# Patient Record
Sex: Male | Born: 1980
Health system: Southern US, Community
[De-identification: ages and names within clinical notes are randomized; demographics above are authoritative.]

## PROBLEM LIST (undated history)

## (undated) DIAGNOSIS — I10 Essential (primary) hypertension: Secondary | ICD-10-CM

## (undated) DIAGNOSIS — M549 Dorsalgia, unspecified: Secondary | ICD-10-CM

## (undated) DIAGNOSIS — G4733 Obstructive sleep apnea (adult) (pediatric): Secondary | ICD-10-CM

## (undated) DIAGNOSIS — F319 Bipolar disorder, unspecified: Secondary | ICD-10-CM

## (undated) HISTORY — DX: Bipolar disorder, unspecified: F31.9

## (undated) HISTORY — DX: Obstructive sleep apnea (adult) (pediatric): G47.33

## (undated) HISTORY — DX: Essential (primary) hypertension: I10

## (undated) HISTORY — PX: TONSILLECTOMY: SUR1361

## (undated) HISTORY — DX: Dorsalgia, unspecified: M54.9

## (undated) HISTORY — PX: RHINOPLASTY: SUR1284

---
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
## 2001-05-04 ENCOUNTER — Emergency Department (HOSPITAL_COMMUNITY): Admission: EM | Admit: 2001-05-04 | Discharge: 2001-05-04 | Payer: Self-pay | Admitting: Emergency Medicine

## 2003-08-24 ENCOUNTER — Ambulatory Visit (HOSPITAL_COMMUNITY): Admission: RE | Admit: 2003-08-24 | Discharge: 2003-08-24 | Payer: Self-pay | Admitting: Internal Medicine

## 2003-08-24 ENCOUNTER — Encounter: Payer: Self-pay | Admitting: Internal Medicine

## 2004-01-20 ENCOUNTER — Encounter (HOSPITAL_COMMUNITY): Admission: RE | Admit: 2004-01-20 | Discharge: 2004-02-19 | Payer: Self-pay | Admitting: Internal Medicine

## 2004-02-09 ENCOUNTER — Ambulatory Visit (HOSPITAL_COMMUNITY): Admission: RE | Admit: 2004-02-09 | Discharge: 2004-02-09 | Payer: Self-pay | Admitting: Internal Medicine

## 2004-11-10 ENCOUNTER — Emergency Department (HOSPITAL_COMMUNITY): Admission: EM | Admit: 2004-11-10 | Discharge: 2004-11-10 | Payer: Self-pay | Admitting: Emergency Medicine

## 2005-11-25 ENCOUNTER — Emergency Department (HOSPITAL_COMMUNITY): Admission: EM | Admit: 2005-11-25 | Discharge: 2005-11-25 | Payer: Self-pay | Admitting: Emergency Medicine

## 2005-11-26 ENCOUNTER — Inpatient Hospital Stay (HOSPITAL_COMMUNITY): Admission: EM | Admit: 2005-11-26 | Discharge: 2005-11-30 | Payer: Self-pay | Admitting: Psychiatry

## 2005-11-27 ENCOUNTER — Ambulatory Visit: Payer: Self-pay | Admitting: Psychiatry

## 2008-06-14 ENCOUNTER — Ambulatory Visit: Payer: Self-pay | Admitting: *Deleted

## 2008-06-14 ENCOUNTER — Emergency Department (HOSPITAL_COMMUNITY): Admission: EM | Admit: 2008-06-14 | Discharge: 2008-06-14 | Payer: Self-pay | Admitting: Emergency Medicine

## 2008-06-15 ENCOUNTER — Inpatient Hospital Stay (HOSPITAL_COMMUNITY): Admission: AD | Admit: 2008-06-15 | Discharge: 2008-06-17 | Payer: Self-pay | Admitting: *Deleted

## 2008-06-23 ENCOUNTER — Ambulatory Visit (HOSPITAL_COMMUNITY): Payer: Self-pay | Admitting: Psychiatry

## 2008-07-01 ENCOUNTER — Ambulatory Visit (HOSPITAL_COMMUNITY): Payer: Self-pay | Admitting: Psychiatry

## 2008-07-07 ENCOUNTER — Ambulatory Visit (HOSPITAL_COMMUNITY): Payer: Self-pay | Admitting: Psychiatry

## 2008-07-15 ENCOUNTER — Ambulatory Visit (HOSPITAL_COMMUNITY): Payer: Self-pay | Admitting: Psychiatry

## 2008-07-20 ENCOUNTER — Ambulatory Visit (HOSPITAL_COMMUNITY): Admission: RE | Admit: 2008-07-20 | Discharge: 2008-07-20 | Payer: Self-pay | Admitting: Neurology

## 2008-08-09 ENCOUNTER — Ambulatory Visit (HOSPITAL_COMMUNITY): Payer: Self-pay | Admitting: Psychiatry

## 2008-10-06 ENCOUNTER — Ambulatory Visit (HOSPITAL_COMMUNITY): Payer: Self-pay | Admitting: Psychiatry

## 2008-11-03 ENCOUNTER — Ambulatory Visit (HOSPITAL_COMMUNITY): Payer: Self-pay | Admitting: Psychiatry

## 2008-11-17 ENCOUNTER — Ambulatory Visit (HOSPITAL_COMMUNITY): Payer: Self-pay | Admitting: Psychiatry

## 2009-01-24 ENCOUNTER — Ambulatory Visit (HOSPITAL_COMMUNITY): Payer: Self-pay | Admitting: Psychiatry

## 2009-03-23 ENCOUNTER — Ambulatory Visit (HOSPITAL_COMMUNITY): Payer: Self-pay | Admitting: Psychiatry

## 2009-06-13 ENCOUNTER — Ambulatory Visit (HOSPITAL_COMMUNITY): Payer: Self-pay | Admitting: Psychiatry

## 2009-09-05 ENCOUNTER — Ambulatory Visit (HOSPITAL_COMMUNITY): Payer: Self-pay | Admitting: Psychiatry

## 2009-10-05 ENCOUNTER — Ambulatory Visit (HOSPITAL_COMMUNITY): Payer: Self-pay | Admitting: Psychiatry

## 2009-12-05 ENCOUNTER — Ambulatory Visit (HOSPITAL_COMMUNITY): Payer: Self-pay | Admitting: Psychiatry

## 2010-03-06 ENCOUNTER — Ambulatory Visit (HOSPITAL_COMMUNITY): Payer: Self-pay | Admitting: Psychiatry

## 2010-06-05 ENCOUNTER — Ambulatory Visit (HOSPITAL_COMMUNITY): Payer: Self-pay | Admitting: Psychiatry

## 2010-09-04 ENCOUNTER — Ambulatory Visit (HOSPITAL_COMMUNITY): Payer: Self-pay | Admitting: Psychiatry

## 2010-11-06 ENCOUNTER — Ambulatory Visit (HOSPITAL_COMMUNITY): Payer: Self-pay | Admitting: Psychiatry

## 2011-01-03 ENCOUNTER — Ambulatory Visit (HOSPITAL_COMMUNITY)
Admission: RE | Admit: 2011-01-03 | Discharge: 2011-01-03 | Payer: Self-pay | Source: Home / Self Care | Attending: Psychiatry | Admitting: Psychiatry

## 2011-03-05 ENCOUNTER — Encounter (INDEPENDENT_AMBULATORY_CARE_PROVIDER_SITE_OTHER): Payer: MEDICARE | Admitting: Psychiatry

## 2011-03-05 DIAGNOSIS — F3189 Other bipolar disorder: Secondary | ICD-10-CM

## 2011-04-04 ENCOUNTER — Encounter (INDEPENDENT_AMBULATORY_CARE_PROVIDER_SITE_OTHER): Payer: MEDICARE | Admitting: Psychiatry

## 2011-04-04 DIAGNOSIS — F3189 Other bipolar disorder: Secondary | ICD-10-CM

## 2011-04-30 ENCOUNTER — Encounter (HOSPITAL_COMMUNITY): Payer: MEDICARE | Admitting: Psychiatry
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
## 2011-06-04 ENCOUNTER — Encounter (INDEPENDENT_AMBULATORY_CARE_PROVIDER_SITE_OTHER): Payer: Medicare Other | Admitting: Psychiatry

## 2011-06-04 DIAGNOSIS — F3189 Other bipolar disorder: Secondary | ICD-10-CM

## 2011-07-02 ENCOUNTER — Encounter (INDEPENDENT_AMBULATORY_CARE_PROVIDER_SITE_OTHER): Payer: Medicare Other | Admitting: Psychiatry

## 2011-07-02 DIAGNOSIS — F3189 Other bipolar disorder: Secondary | ICD-10-CM

## 2011-08-01 ENCOUNTER — Encounter (INDEPENDENT_AMBULATORY_CARE_PROVIDER_SITE_OTHER): Payer: Medicare Other | Admitting: Psychiatry

## 2011-08-01 DIAGNOSIS — F3189 Other bipolar disorder: Secondary | ICD-10-CM

## 2011-09-12 LAB — URINALYSIS, ROUTINE W REFLEX MICROSCOPIC
Bilirubin Urine: NEGATIVE
Glucose, UA: NEGATIVE
Hgb urine dipstick: NEGATIVE
Protein, ur: NEGATIVE
Specific Gravity, Urine: 1.015

## 2011-09-12 LAB — DIFFERENTIAL
Basophils Absolute: 0.2 — ABNORMAL HIGH
Basophils Relative: 1
Monocytes Absolute: 0.6
Neutro Abs: 6
Neutrophils Relative %: 57

## 2011-09-12 LAB — RAPID URINE DRUG SCREEN, HOSP PERFORMED
Amphetamines: NOT DETECTED
Barbiturates: NOT DETECTED
Benzodiazepines: POSITIVE — AB
Opiates: POSITIVE — AB
Tetrahydrocannabinol: NOT DETECTED

## 2011-09-12 LAB — BASIC METABOLIC PANEL
CO2: 25
Calcium: 9.3
Creatinine, Ser: 0.82
Glucose, Bld: 119 — ABNORMAL HIGH

## 2011-09-12 LAB — VALPROIC ACID LEVEL: Valproic Acid Lvl: 60.5

## 2011-09-12 LAB — CBC
MCHC: 34.8
Platelets: 277
RDW: 12.9

## 2011-09-26 ENCOUNTER — Encounter (INDEPENDENT_AMBULATORY_CARE_PROVIDER_SITE_OTHER): Payer: Medicare Other | Admitting: Psychiatry

## 2011-09-26 DIAGNOSIS — F3189 Other bipolar disorder: Secondary | ICD-10-CM

## 2011-10-15 ENCOUNTER — Encounter (INDEPENDENT_AMBULATORY_CARE_PROVIDER_SITE_OTHER): Payer: Medicare Other | Admitting: Psychiatry

## 2011-10-15 DIAGNOSIS — F3189 Other bipolar disorder: Secondary | ICD-10-CM

## 2011-10-21 NOTE — Progress Notes (Signed)
This encounter was created in error - please disregard.

## 2011-10-24 ENCOUNTER — Other Ambulatory Visit (HOSPITAL_COMMUNITY): Payer: Self-pay | Admitting: Psychiatry

## 2011-10-25 LAB — LITHIUM LEVEL: Lithium Lvl: 0.97 mEq/L (ref 0.80–1.40)

## 2011-11-05 ENCOUNTER — Ambulatory Visit (INDEPENDENT_AMBULATORY_CARE_PROVIDER_SITE_OTHER): Payer: Medicare Other | Admitting: Psychiatry

## 2011-11-05 ENCOUNTER — Encounter (HOSPITAL_COMMUNITY): Payer: Medicare Other | Admitting: Psychiatry

## 2011-11-05 ENCOUNTER — Encounter (HOSPITAL_COMMUNITY): Payer: Self-pay | Admitting: Psychiatry

## 2011-11-05 VITALS — Wt 276.0 lb

## 2011-11-05 DIAGNOSIS — F319 Bipolar disorder, unspecified: Secondary | ICD-10-CM

## 2011-11-05 MED ORDER — BUPROPION HCL ER (XL) 300 MG PO TB24: 300.0000 mg | ORAL_TABLET | Freq: Every day | ORAL | Status: DC

## 2011-11-05 MED ORDER — LITHIUM CARBONATE 300 MG PO TABS: ORAL_TABLET | ORAL | Status: DC

## 2011-11-05 NOTE — Progress Notes (Signed)
Patient came for his appointment he is now taking lithium 600 mg in the morning and 900 mg at bedtime. Reported he has seen a big improvement in his behavior anger and sleep. He told his wife also noticed improvement in his illness. He has noticed much calmer and less agitated and handling his family issues better. His lithium level is 0.97 and he reported no side effects of medication. He is still does not want and counseling but feels his current medication working good. He has lost almost 40 pound since he stopped from Seroquel and Depakote. He appears pleasant cooperative and engageable. He denies any anger or irritable note. He is looking forward to have Thanksgiving dinner with her family. He'll still take Percocet and morphine sulfate for his back pain. He was recommended to have a back surgery however he was told that he need to reduce his weight and quit smoking and he is working on it.  Mental status emanation Patient is calm cooperative pleasant and maintained good eye contact. His speech is soft clear and coherent. His thought process is also logical and linear. He denies any active or passive suicidal thinking or homicidal thinking. There are no psychotic symptoms present. There no shakes or tremors present. His attention and concentration is improved. He is alert and oriented x3. His insight judgment and impulse control is better.  Assessment Bipolar disorder, polysubstance dependence in partial remission  Plan I will continue his current dose of lithium and Wellbutrin. I have explained the risk and benefits of medication. He will continue to take his pain medication for his back pain. He has not quit smoking but like to stop in the or future so that he can have a back surgery. I recommended to call us if he has any question or concern otherwise I will see him again in 2 months.

## 2011-11-06 ENCOUNTER — Other Ambulatory Visit (HOSPITAL_COMMUNITY): Payer: Self-pay | Admitting: Psychiatry

## 2011-11-06 MED ORDER — LITHIUM CARBONATE ER 300 MG PO TBCR: EXTENDED_RELEASE_TABLET | ORAL | Status: DC

## 2011-11-30 ENCOUNTER — Other Ambulatory Visit (HOSPITAL_COMMUNITY): Payer: Self-pay

## 2012-01-02 ENCOUNTER — Encounter (HOSPITAL_COMMUNITY): Payer: Self-pay | Admitting: Psychiatry

## 2012-01-02 ENCOUNTER — Ambulatory Visit (HOSPITAL_COMMUNITY): Payer: Medicare Other | Admitting: Psychiatry

## 2012-01-02 ENCOUNTER — Ambulatory Visit (INDEPENDENT_AMBULATORY_CARE_PROVIDER_SITE_OTHER): Payer: Medicare Other | Admitting: Psychiatry

## 2012-01-02 VITALS — Wt 251.0 lb

## 2012-01-02 DIAGNOSIS — F319 Bipolar disorder, unspecified: Secondary | ICD-10-CM

## 2012-01-02 NOTE — Progress Notes (Signed)
Patient came for his appointment with his wife. Patient admitted that recently she's been more irritable agitated frustrated and having angry episodes. Wife also endorse that he's been very angry punching the wall cursing and yelling and sometime resistant to take medication. Patient admitted he has stopped taking lithium for piled due to the concern that it was causing sexual side effects however when he realizes that he is still have sexual side effects he started again his lithium. Patient reported that he continues to have paranoid thinking and has been very isolated secluded and at times withdrawn. He does not want to be bothered by anyone. Though he denies any active or passive suicidal thoughts but admitted extreme mood lability and agitation. I have reviewed his past psychiatric history. He has been taken Seroquel Risperdal and Depakote in the past but he stopped due to the the sexual side effects of weight gain. Patient does not want to go back on these medication however willing to try a different medication. He is happy as he loss 20 pounds in past 3 months however he also admitted that he is in a crash diet. He is not using any drugs or alcohol at this time.  [facility] with suicidal plan to shoot himself.  Alcohol and substance use history Patient has history of using crack and cocaine in the past.   Medical history  Chronic back pain obesity hypertension  Mental status examination Patient is mildly obese male who is casually dressed and fairly groomed. He maintained poor eye contact. Somebody is guarded. His speech is slow but coherent. He at times appeared frustrated and irritable. He denies any active or passive suicidal thoughts or homicidal thoughts. He endorse paranoid thinking but there were no delusion obsession present. He has some mild shakes. He's alert and oriented x3. His insight  judgment and impulse control is okay  Assessment Axis I Bipolar disorder with psychotic features,           polysubstance dependence in partial remission Axis II deferred Axis III see medical history Axis IV moderate  Plan At this time I see patient has been decompensating slowly. I talked to the patient and his wife lamp about his sign symptoms of aggression in need of medical adjustment. Patient is very reluctant to go back on Depakote Seroquel Risperdal or Zyprexa due to weight gain however he is willing to try Abilify. I will try Abilify 5 mg a day and gradually increase to 10 mg in one week. I have explained risks and benefits of medication in length especially possible sexual side effects and weight gain however we will closely monitor the medication response. We also talked about safety plan that in case if he feels worsening of her symptoms or any time having suicidal thoughts or homicidal thoughts and he need to call 911 or go to local ER. For now he will continue lithium and Wellbutrin. Once again I have offered him sleep however he refused. I will see him again in 3 weeks. I have given samples of Abilify. Time spent 30 minutes

## 2012-01-15 ENCOUNTER — Other Ambulatory Visit (HOSPITAL_COMMUNITY): Payer: Self-pay | Admitting: *Deleted

## 2012-01-15 ENCOUNTER — Other Ambulatory Visit (HOSPITAL_COMMUNITY): Payer: Self-pay | Admitting: Psychiatry

## 2012-01-15 MED ORDER — BUPROPION HCL ER (XL) 300 MG PO TB24: 300.0000 mg | ORAL_TABLET | Freq: Every day | ORAL | Status: DC

## 2012-01-21 ENCOUNTER — Ambulatory Visit (INDEPENDENT_AMBULATORY_CARE_PROVIDER_SITE_OTHER): Payer: Medicare Other | Admitting: Psychiatry

## 2012-01-21 ENCOUNTER — Encounter (HOSPITAL_COMMUNITY): Payer: Self-pay | Admitting: Psychiatry

## 2012-01-21 VITALS — BP 138/65 | HR 74 | Wt 243.0 lb

## 2012-01-21 DIAGNOSIS — Z79899 Other long term (current) drug therapy: Secondary | ICD-10-CM

## 2012-01-21 DIAGNOSIS — F319 Bipolar disorder, unspecified: Secondary | ICD-10-CM

## 2012-01-21 LAB — COMPREHENSIVE METABOLIC PANEL
ALT: 30 U/L (ref 0–53)
AST: 16 U/L (ref 0–37)
Calcium: 10.6 mg/dL — ABNORMAL HIGH (ref 8.4–10.5)
Chloride: 103 mEq/L (ref 96–112)
Creat: 0.93 mg/dL (ref 0.50–1.35)
Total Bilirubin: 0.5 mg/dL (ref 0.3–1.2)

## 2012-01-21 LAB — CBC WITH DIFFERENTIAL/PLATELET
Basophils Absolute: 0 10*3/uL (ref 0.0–0.1)
Eosinophils Relative: 2 % (ref 0–5)
Lymphocytes Relative: 32 % (ref 12–46)
MCV: 94.9 fL (ref 78.0–100.0)
Platelets: 373 10*3/uL (ref 150–400)
RDW: 12.8 % (ref 11.5–15.5)
WBC: 10.2 10*3/uL (ref 4.0–10.5)

## 2012-01-21 MED ORDER — BUPROPION HCL ER (XL) 300 MG PO TB24: 300.0000 mg | ORAL_TABLET | Freq: Every day | ORAL | Status: DC

## 2012-01-21 MED ORDER — LITHIUM CARBONATE ER 300 MG PO TBCR: EXTENDED_RELEASE_TABLET | ORAL | Status: DC

## 2012-01-21 MED ORDER — ARIPIPRAZOLE 10 MG PO TABS: 10.0000 mg | ORAL_TABLET | Freq: Every day | ORAL | Status: DC

## 2012-01-21 NOTE — Progress Notes (Signed)
Chief complaint I'm doing much better with Abilify  History of present illness Patient came for his appointment with his wife. Patient admitted that Abilify helping his mood anger and agitation. He and his wife noticed less irritable and much calmer. He denies any recent episode of punching the wall or cursing or yelling. He likes current Abilify. He reported no side effects of medication. He is taking 10 mg Abilify. He denies any sexual side effects her weight gain. Actually he has lost weight from the last visit. He is wondering if Abilify can be further increased. He is not using drugs or alcohol at this time.   [facility]using [facility] with suicidal plan to shoot himself.  Alcohol and substance use history Patient has history of using crack and cocaine in the past.   Medical history Chronic back pain obesity hypertension  Mental status examination Patient is casually dressed and fairly groomed. He maintained good eye contact. He is pleasant and cooperative. Denies any active or passive suicidal thoughts or homicidal thoughts. He denies any auditory or visual hallucination. His paranoia is less intense and less frequent. He described his mood is good and his affect is bright.  He has some mild shakes. He's alert and oriented x3. His insight judgment and impulse control is okay  Assessment Axis I Bipolar disorder with psychotic features,           polysubstance dependence in partial remission Axis II deferred Axis III see medical history Axis IV moderate  Plan Patient has shown improvement with Abilify. Patient and his wife agreed to continue the Abilify. I talked with them in length explaining side effects and benefits of the medication. I recommended to monitor the weight as Abilify can cause metabolic side effects and weight gain. He will continue Wellbutrin, lithium as prescribed. I also reviewed his other  medication will he brought with him. Patient is taking Keppra muscle relaxant and pain medication. I explained the drug interaction of pain medication for psychotropic medication. And also talk about safety plan in case patient started to feel worsening and anytime having suicidal thoughts and homicidal thoughts and he to call 911 or go to local ER. I also ordered CBC chemistry and lithium level. I will see him again in 2 months. Time spent 30 minutes

## 2012-03-19 ENCOUNTER — Ambulatory Visit (INDEPENDENT_AMBULATORY_CARE_PROVIDER_SITE_OTHER): Payer: Medicare Other | Admitting: Psychiatry

## 2012-03-19 ENCOUNTER — Encounter (HOSPITAL_COMMUNITY): Payer: Self-pay | Admitting: Psychiatry

## 2012-03-19 VITALS — Wt 231.0 lb

## 2012-03-19 DIAGNOSIS — Z79899 Other long term (current) drug therapy: Secondary | ICD-10-CM

## 2012-03-19 DIAGNOSIS — F319 Bipolar disorder, unspecified: Secondary | ICD-10-CM

## 2012-03-19 MED ORDER — ARIPIPRAZOLE 10 MG PO TABS: 10.0000 mg | ORAL_TABLET | Freq: Every day | ORAL | Status: DC

## 2012-03-19 MED ORDER — LITHIUM CARBONATE ER 300 MG PO TBCR: EXTENDED_RELEASE_TABLET | ORAL | Status: DC

## 2012-03-19 MED ORDER — BUPROPION HCL ER (XL) 300 MG PO TB24: 300.0000 mg | ORAL_TABLET | Freq: Every day | ORAL | Status: DC

## 2012-03-19 NOTE — Progress Notes (Signed)
Chief complaint Medication management and followup.    History of present illness Patient came for his appointment with his wife.  Patient is compliant with the medication and reported no side effects.  His anger and agitation are less frequent and less intense.  Wife endorse that he is doing much better on his current medication.  He also loss 12 pound from her last visit .  He is watching his diet and doing regular walking .  She denies any side effects of medication.  He's not drinking or using any illegal substance.  He wants to continue on his current medication .  I reviewed his blood test his lithium level is 1.05 and his CBC and chemistries are within normal limits.  Patient admitted that Abilify helping his mood anger and agitation.  [facility] with suicidal plan to shoot himself.  Alcohol and substance use history Patient has history of using crack and cocaine in the past.   Medical history Chronic back pain obesity hypertension  Mental status examination Patient is casually dressed and fairly groomed. He maintained good eye contact. He is pleasant and cooperative. Denies any active or passive suicidal thoughts or homicidal thoughts. He denies any auditory or visual hallucination. His paranoia is less intense and less frequent. He described his mood is good and his affect is bright.  He has some mild shakes. He's alert and oriented x3. His insight judgment and impulse control is okay  Assessment Axis I Bipolar disorder with psychotic features,           polysubstance dependence in partial remission Axis II deferred Axis III see medical history Axis IV moderate  Plan I will continue his current medication .  Blood work results discussed with the patient and his wife .  I also gave copy of the blood results has patient is scheduled to see his primary care physician in few weeks .  I  recommended to call us if he has any question or concern about the medication or if he feel worsening of the symptoms .  I will see him again in 8 weeks .  I will also consider seeing him every 3 months if patient continues to show improvement on his medication .

## 2012-05-19 ENCOUNTER — Ambulatory Visit (HOSPITAL_COMMUNITY): Payer: Medicare Other | Admitting: Psychiatry

## 2012-06-02 ENCOUNTER — Other Ambulatory Visit (HOSPITAL_COMMUNITY): Payer: Self-pay | Admitting: Psychiatry

## 2012-06-12 ENCOUNTER — Other Ambulatory Visit (HOSPITAL_COMMUNITY): Payer: Self-pay | Admitting: Psychiatry

## 2012-06-12 DIAGNOSIS — F319 Bipolar disorder, unspecified: Secondary | ICD-10-CM

## 2012-07-03 ENCOUNTER — Other Ambulatory Visit (HOSPITAL_COMMUNITY): Payer: Self-pay | Admitting: Psychiatry

## 2012-07-03 DIAGNOSIS — F319 Bipolar disorder, unspecified: Secondary | ICD-10-CM

## 2012-07-03 MED ORDER — ARIPIPRAZOLE 10 MG PO TABS: 10.0000 mg | ORAL_TABLET | Freq: Every day | ORAL | Status: DC

## 2012-07-03 MED ORDER — LITHIUM CARBONATE ER 300 MG PO TBCR: EXTENDED_RELEASE_TABLET | ORAL | Status: DC

## 2012-07-14 ENCOUNTER — Ambulatory Visit (INDEPENDENT_AMBULATORY_CARE_PROVIDER_SITE_OTHER): Payer: Medicare Other | Admitting: Psychiatry

## 2012-07-14 ENCOUNTER — Encounter (HOSPITAL_COMMUNITY): Payer: Self-pay | Admitting: Psychiatry

## 2012-07-14 VITALS — Wt 231.0 lb

## 2012-07-14 DIAGNOSIS — F319 Bipolar disorder, unspecified: Secondary | ICD-10-CM

## 2012-07-14 MED ORDER — BUPROPION HCL ER (XL) 300 MG PO TB24: 300.0000 mg | ORAL_TABLET | Freq: Every day | ORAL | Status: DC

## 2012-07-14 MED ORDER — ARIPIPRAZOLE 10 MG PO TABS: 10.0000 mg | ORAL_TABLET | Freq: Every day | ORAL | Status: DC

## 2012-07-14 MED ORDER — LITHIUM CARBONATE ER 300 MG PO TBCR: EXTENDED_RELEASE_TABLET | ORAL | Status: DC

## 2012-07-14 NOTE — Progress Notes (Signed)
Chief complaint Medication management and followup.    History of present illness Patient came for his appointment he is compliant with his medication.  He was recently seen his primary care physician Dr. Elwyn Reach will start him on Xanax 1 mg which he is taking twice a day.  Patient told that he prescribed Xanax in the past by his primary care physician who had helped a lot for his anxiety.  Patient is very reluctant to cut down the Xanax.  He told he has never issue with the Xanax.  He never ask early refills or ever abused.  He reported his anxiety is much better.  He is sleeping better.  He continued to have some paranoia and feel that his wife cheating on him but he realize that's not true.  He denies any recent agitation anger mood swing.  He feels calm with addition of Xanax.  His primary care physician will continue his Xanax.  I explained the risk and benefits of medication especially tolerance withdrawal and dependency issue with the benzodiazepine.  He is not drinking or using any illegal substance.  He claims to be sober from cocaine use.  He likes his current psychiatric medication.  He has any side effects of medication.  His weight remains unchanged from the past.  We have discussed his blood results on his last visit.  His lithium level was 1.05 and his CBC and chemistries are within normal limits.     current psychiatric medication Lithium carbonate 300 mg 2 in the morning and 3 at bedtime  Abilify 10 mg daily Wellbutrin XL 300 mg daily  Xanax 1 mg up to 3 times a day prescribed by primary care physician Dr. Philipp Deputy.  [facility]using [facility] with suicidal plan to shoot himself.  Alcohol and substance use history Patient has history of using crack and cocaine in the past.   Medical history Chronic back pain obesity hypertension  Mental status examination Patient is casually dressed and fairly groomed. He  maintained good eye contact. He is pleasant and cooperative. Denies any active or passive suicidal thoughts or homicidal thoughts. He denies any auditory or visual hallucination. His paranoia is less intense and less frequent. He described his mood is good and his affect is bright.  He has some mild shakes. He's alert and oriented x3. His insight judgment and impulse control is okay  Assessment Axis I Bipolar disorder with psychotic features,           polysubstance dependence in partial remission Axis II deferred Axis III see medical history Axis IV moderate  Plan I  discuss about medication side effects and benefits.  We talk about benzodiazepine dependence tolerance and withdrawal issues.  At this time patient is reluctant to cut down his Xanax .  He is getting Xanax from his primary care physician.  Patient told his primary care physician knows about history of drug use.  I will continue Abilify, Lithobid and Wellbutrin.  I recommend to call us if he is a question or concern about the medication or if he feel worsening of the symptoms.  I will see him again in 3 months.  Portion of this note is generated with voice dictation software and may contain typographical error.

## 2012-10-12 ENCOUNTER — Telehealth (HOSPITAL_COMMUNITY): Payer: Self-pay | Admitting: *Deleted

## 2012-10-12 NOTE — Telephone Encounter (Signed)
Sees Dr.Arfeen in [facility] office.Needs to talk with Dr.Arfeen about changing doctors. Does not mind traveling to [facility]

## 2012-10-12 NOTE — Telephone Encounter (Signed)
Spoke to patient explained that he will be seeing a new doctor in [place].  Patient calm down and will keep appointment tomorrow.

## 2012-10-13 ENCOUNTER — Encounter (HOSPITAL_COMMUNITY): Payer: Self-pay | Admitting: Psychiatry

## 2012-10-13 ENCOUNTER — Telehealth (HOSPITAL_COMMUNITY): Payer: Self-pay | Admitting: *Deleted

## 2012-10-13 ENCOUNTER — Ambulatory Visit (INDEPENDENT_AMBULATORY_CARE_PROVIDER_SITE_OTHER): Payer: Medicare Other | Admitting: Psychiatry

## 2012-10-13 VITALS — BP 144/84 | HR 68 | Ht 66.0 in | Wt 235.6 lb

## 2012-10-13 DIAGNOSIS — F419 Anxiety disorder, unspecified: Secondary | ICD-10-CM

## 2012-10-13 DIAGNOSIS — F319 Bipolar disorder, unspecified: Secondary | ICD-10-CM

## 2012-10-13 MED ORDER — LITHIUM CARBONATE ER 300 MG PO TBCR: EXTENDED_RELEASE_TABLET | ORAL | Status: DC

## 2012-10-13 MED ORDER — BUPROPION HCL ER (XL) 450 MG PO TB24: 450.0000 mg | ORAL_TABLET | Freq: Every day | ORAL | Status: DC

## 2012-10-13 MED ORDER — ALPRAZOLAM 2 MG PO TABS: 1.0000 mg | ORAL_TABLET | Freq: Two times a day (BID) | ORAL | Status: DC

## 2012-10-13 MED ORDER — CARBAMAZEPINE ER 200 MG PO TB12: 600.0000 mg | ORAL_TABLET | Freq: Every day | ORAL | Status: DC

## 2012-10-13 NOTE — Progress Notes (Signed)
Chief complaint Medication management and followup.    History of present illness Patient came for his appointment he is compliant with his medication.  He was recently seen his primary care physician Dr. Elwyn Reach will start him on Xanax 1 mg which he is taking twice a day.  Patient told that he prescribed Xanax in the past by his primary care physician who had helped a lot for his anxiety.  Patient is very reluctant to cut down the Xanax.  He told he has never issue with the Xanax.  He never ask early refills or ever abused.  He reported his anxiety is much better.  He is sleeping better.  He continued to have some paranoia and feel that his wife cheating on him but he realize that's not true.  He denies any recent agitation anger mood swing.  He feels calm with addition of Xanax.  His primary care physician will continue his Xanax.  I explained the risk and benefits of medication especially tolerance withdrawal and dependency issue with the benzodiazepine.  He is not drinking or using any illegal substance.  He claims to be sober from cocaine use.  He likes his current psychiatric medication.  He has any side effects of medication.  His weight remains unchanged from the past.  We have discussed his blood results on his last visit.  His lithium level was 1.05 and his CBC and chemistries are within normal limits.     current psychiatric medication Lithium carbonate 300 mg 2 in the morning and 3 at bedtime  Abilify 10 mg daily Wellbutrin XL 300 mg daily  Xanax 1 mg up to 3 times a day prescribed by primary care physician Dr. Philipp Deputy.  Past psychiatric history [facility]using [facility] with suicidal plan to shoot himself.  Alcohol and substance use history Patient has history of using crack and cocaine in the past.   Medical history Chronic back pain obesity hypertension  Mental status examination Patient is casually dressed and fairly groomed. He  maintained good eye contact. He is pleasant and cooperative. Denies any active or passive suicidal thoughts or homicidal thoughts. He denies any auditory or visual hallucination. His paranoia is less intense and less frequent. He described his mood is good and his affect is bright.  He has some mild shakes. He's alert and oriented x3. His insight judgment and impulse control is okay  Assessment Axis I Bipolar disorder with psychotic features,           polysubstance dependence in partial remission Axis II deferred Axis III see medical history Axis IV moderate  Plan I spent considerable time with patient and his wife regarding his use of cigarettes opiates and benzos. Tried to impress him with the fact that he needs to stop smoking and that his pain was due to his opiates scurry up his pain perception and that his depression was due to that pain. He states the there is no improvement with lithium or with current dose of Wellbutrin. He was agreeable to starting Tegretol stopping the lithium and increasing the Wellbutrin.  Portion of this note is generated with voice dictation software and may contain typographical error.

## 2012-10-13 NOTE — Progress Notes (Deleted)
  [facility] [address] Progress Note  GERADO NABERS [id number] 31 y.o.  10/13/2012 9:54 AM  Chief Complaint: Depression with pain and paranoia  History of Present Illness: Suicidal Ideation: Yes once or twice in the last three months. Plan Formed: Yes Patient has means to carry out plan: {BHH YES OR NO:22294}  Homicidal Ideation: {BHH YES OR NO:22294} Plan Formed: {BHH YES OR NO:22294} Patient has means to carry out plan: {BHH YES OR NO:22294}  Review of Systems: Psychiatric: Agitation: {BHH YES OR NO:22294} Hallucination: {BHH YES OR NO:22294} Depressed Mood: {BHH YES OR NO:22294} Insomnia: {BHH YES OR NO:22294} Hypersomnia: {BHH YES OR NO:22294} Altered Concentration: {BHH YES OR NO:22294} Feels Worthless: {BHH YES OR NO:22294} Grandiose Ideas: {BHH YES OR NO:22294} Belief In Special Powers: {BHH YES OR NO:22294} New/Increased Substance Abuse: {BHH YES OR NO:22294} Compulsions: {BHH YES OR NO:22294}  Neurologic: Headache: {BHH YES OR NO:22294} Seizure: {BHH YES OR NO:22294} Paresthesias: {BHH YES OR XB:14782}  Past Medical Family, Social History: ***  Outpatient Encounter Prescriptions as of 10/13/2012  Medication Sig Dispense Refill  . alprazolam (XANAX) 2 MG tablet Take 1 mg by mouth 2 (two) times daily.       . ARIPiprazole (ABILIFY) 10 MG tablet Take 1 tablet (10 mg total) by mouth daily.  30 tablet  2  . buPROPion (WELLBUTRIN XL) 300 MG 24 hr tablet Take 1 tablet (300 mg total) by mouth daily.  30 tablet  2  . levETIRAcetam (KEPPRA) 500 MG tablet Take 500 mg by mouth 4 (four) times daily.      Marland Kitchen lisinopril-hydrochlorothiazide (PRINZIDE,ZESTORETIC) 10-12.5 MG per tablet Take 1 tablet by mouth daily.      Marland Kitchen lithium carbonate (LITHOBID) 300 MG CR tablet Take 2 tab in am and 3 tab at hs  150 tablet  2  . methocarbamol (ROBAXIN) 500 MG tablet Take 500 mg by mouth 2 (two) times daily as needed.      Marland Kitchen morphine (MSIR) 30 MG tablet Take 30 mg by mouth as needed.         Marland Kitchen oxycodone-acetaminophen (LYNOX) 10-300 MG per tablet Take 1 tablet by mouth as needed.          Past Psychiatric History/Hospitalization(s): Anxiety: {BHH YES OR NO:22294} Bipolar Disorder: {BHH YES OR NO:22294} Depression: {BHH YES OR NO:22294} Mania: {BHH YES OR NO:22294} Psychosis: {BHH YES OR NO:22294} Schizophrenia: {BHH YES OR NO:22294} Personality Disorder: {BHH YES OR NO:22294} Hospitalization for psychiatric illness: {BHH YES OR NO:22294} History of Electroconvulsive Shock Therapy: {BHH YES OR NO:22294} Prior Suicide Attempts: {BHH YES OR NO:22294}  Physical Exam: Constitutional:  BP 144/84  Pulse 68  Ht 5\' 6"  (1.676 m)  Wt 235 lb 9.6 oz (106.867 kg)  BMI 38.03 kg/m2  General Appearance: {PE GENERAL APPEARANCE:22457}  Musculoskeletal: Strength & Muscle Tone: {desc; muscle tone:32375} Gait & Station: {PE GAIT ED NFAO:13086} Patient leans: {Patient Leans:21022755}  Psychiatric: Speech (describe rate, volume, coherence, spontaneity, and abnormalities if any): ***  Thought Process (describe rate, content, abstract reasoning, and computation): ***  Associations: {BHH THOUGHT PROCESS:22309}  Thoughts: {thought content:31889}  Mental Status: Orientation: {Exam; psychiatric orientation:30297} Mood & Affect: {EXAM; PSYCH VHQION:62952} Attention Span & Concentration: ***  Medical Decision Making (Choose Three): {BH Medical Decision Making:21022756}  Assessment: Axis I: ***  Axis II: ***  Axis III: ***  Axis IV: ***  Axis V: ***   Plan: Dan Humphreys, Lindy Garczynski, MD 10/13/2012

## 2012-10-14 ENCOUNTER — Telehealth (HOSPITAL_COMMUNITY): Payer: Self-pay | Admitting: Psychiatry

## 2012-10-14 DIAGNOSIS — F319 Bipolar disorder, unspecified: Secondary | ICD-10-CM

## 2012-10-16 MED ORDER — CARBAMAZEPINE ER 200 MG PO TB12: 600.0000 mg | ORAL_TABLET | Freq: Every day | ORAL | Status: DC

## 2012-10-16 NOTE — Telephone Encounter (Signed)
Stop LITHIUM and don't take any more and STOP

## 2012-10-16 NOTE — Addendum Note (Signed)
Addended by: Mike Craze on: 10/16/2012 04:18 PM   Modules accepted: Orders

## 2012-10-19 NOTE — Addendum Note (Signed)
Addended by: Mike Craze on: 10/19/2012 02:36 PM   Modules accepted: Orders

## 2012-11-11 ENCOUNTER — Encounter (HOSPITAL_COMMUNITY): Payer: Self-pay | Admitting: Psychiatry

## 2012-11-11 ENCOUNTER — Ambulatory Visit (INDEPENDENT_AMBULATORY_CARE_PROVIDER_SITE_OTHER): Payer: Medicare Other | Admitting: Psychiatry

## 2012-11-11 VITALS — BP 140/80 | HR 76 | Ht 66.25 in | Wt 230.6 lb

## 2012-11-11 DIAGNOSIS — R413 Other amnesia: Secondary | ICD-10-CM | POA: Insufficient documentation

## 2012-11-11 DIAGNOSIS — F1920 Other psychoactive substance dependence, uncomplicated: Secondary | ICD-10-CM

## 2012-11-11 DIAGNOSIS — F0781 Postconcussional syndrome: Secondary | ICD-10-CM

## 2012-11-11 DIAGNOSIS — R52 Pain, unspecified: Secondary | ICD-10-CM

## 2012-11-11 DIAGNOSIS — F319 Bipolar disorder, unspecified: Secondary | ICD-10-CM

## 2012-11-11 MED ORDER — BUPROPION HCL ER (XL) 450 MG PO TB24: 450.0000 mg | ORAL_TABLET | Freq: Every day | ORAL | Status: DC

## 2012-11-11 MED ORDER — CARBAMAZEPINE ER 200 MG PO TB12: 1000.0000 mg | ORAL_TABLET | Freq: Every day | ORAL | Status: DC

## 2012-11-11 MED ORDER — OMEGA-3-ACID ETHYL ESTERS 1 G PO CAPS: 1.0000 g | ORAL_CAPSULE | Freq: Two times a day (BID) | ORAL | Status: DC

## 2012-11-11 MED ORDER — DONEPEZIL HCL 5 MG PO TABS: 5.0000 mg | ORAL_TABLET | Freq: Every day | ORAL | Status: DC

## 2012-11-11 NOTE — Patient Instructions (Addendum)
Chronic traumatic encephalopathy and  Partial onset seizures are terms that might reveal some stuff to you.  Look them up on the Internet  Start taking fish oil twice a day.  I am adding Aricept for memory problems.Edward Lutz

## 2012-11-11 NOTE — Progress Notes (Signed)
Chief complaint Medication management and followup.    History of present illness Patient came for his appointment with his wife.  He had noted improvement for about 2 weeks then it has gotten some back to where he was with some off and on moods.  People that he hung around noted that he was much calmer and different during those first two weeks.  He noted that his thinking was better.  He has gotten some what back to some good days and some bad days.  He notes a lot of trouble with his memory.  He gets into considerable arguments with his wife over things that he does not remember that she says.  Will try Fish oil and Aricept for that.   Current psychiatric medication Tegretol XR 200mg  3 at HS Wellbutrin XL 450mg  every AM Xanax 1 mg up to 3 times a day prescribed by primary care physician Dr. Philipp Deputy.  [facility]using [facility] with suicidal plan to shoot himself.  Alcohol and substance use history Patient has history of using crack and cocaine in the past.   Medical history Chronic back pain obesity hypertension  Mental status examination Patient is casually dressed and fairly groomed. He maintained good eye contact. He is pleasant and cooperative. Denies any active or passive suicidal thoughts or homicidal thoughts. He denies any auditory or visual hallucination. His paranoia is less intense and less frequent. He described his mood is good and his affect is bright.  He has some mild shakes. He's alert and oriented x3. His insight judgment and impulse control is okay  Assessment Axis I Bipolar disorder with psychotic features, polysubstance dependence in partial remission, chronic traumatic encephaolopathy Axis II deferred Axis III see medical history Axis IV moderate  Plan I took his vitals.  I reviewed CC, tobacco/med/surg Hx, meds effects/ side effects, problem list, therapies and responses as well as  current situation/symptoms discussed options. Discussed the metabolism increasing effect of Tegretol.  Will push to 4 and possibly 5 a day.  This will impact his pain management medications. See orders and pt instructions for more details.

## 2012-12-23 ENCOUNTER — Ambulatory Visit (HOSPITAL_COMMUNITY): Payer: Medicare Other | Admitting: Psychiatry

## 2012-12-28 ENCOUNTER — Ambulatory Visit (HOSPITAL_COMMUNITY): Payer: Medicare Other | Admitting: Psychiatry

## 2012-12-30 ENCOUNTER — Encounter (HOSPITAL_COMMUNITY): Payer: Self-pay | Admitting: Psychiatry

## 2012-12-30 ENCOUNTER — Ambulatory Visit (INDEPENDENT_AMBULATORY_CARE_PROVIDER_SITE_OTHER): Payer: Medicare Other | Admitting: Psychiatry

## 2012-12-30 VITALS — Wt 240.8 lb

## 2012-12-30 DIAGNOSIS — F319 Bipolar disorder, unspecified: Secondary | ICD-10-CM

## 2012-12-30 DIAGNOSIS — R413 Other amnesia: Secondary | ICD-10-CM

## 2012-12-30 DIAGNOSIS — F1920 Other psychoactive substance dependence, uncomplicated: Secondary | ICD-10-CM

## 2012-12-30 DIAGNOSIS — F0781 Postconcussional syndrome: Secondary | ICD-10-CM

## 2012-12-30 DIAGNOSIS — R748 Abnormal levels of other serum enzymes: Secondary | ICD-10-CM

## 2012-12-30 DIAGNOSIS — Z833 Family history of diabetes mellitus: Secondary | ICD-10-CM

## 2012-12-30 DIAGNOSIS — R52 Pain, unspecified: Secondary | ICD-10-CM

## 2012-12-30 DIAGNOSIS — F3189 Other bipolar disorder: Secondary | ICD-10-CM

## 2012-12-30 DIAGNOSIS — E559 Vitamin D deficiency, unspecified: Secondary | ICD-10-CM

## 2012-12-30 LAB — COMPREHENSIVE METABOLIC PANEL
Alkaline Phosphatase: 101 U/L (ref 39–117)
CO2: 25 mEq/L (ref 19–32)
Creat: 0.87 mg/dL (ref 0.50–1.35)
Glucose, Bld: 101 mg/dL — ABNORMAL HIGH (ref 70–99)
Sodium: 140 mEq/L (ref 135–145)
Total Bilirubin: 0.3 mg/dL (ref 0.3–1.2)
Total Protein: 7.6 g/dL (ref 6.0–8.3)

## 2012-12-30 LAB — CARBAMAZEPINE LEVEL, TOTAL: Carbamazepine Lvl: 7 ug/mL (ref 4.0–12.0)

## 2012-12-30 LAB — HEMOGLOBIN A1C
Hgb A1c MFr Bld: 5.6 % (ref <5.7)
Mean Plasma Glucose: 114 mg/dL (ref <117)

## 2012-12-30 MED ORDER — DONEPEZIL HCL 10 MG PO TABS: 10.0000 mg | ORAL_TABLET | Freq: Every day | ORAL | Status: DC

## 2012-12-30 MED ORDER — CARBAMAZEPINE ER 200 MG PO TB12: ORAL_TABLET | ORAL | Status: DC

## 2012-12-30 NOTE — Patient Instructions (Signed)
Get labs today.  I will eScript the tegretol in when the Teg level is known.

## 2012-12-30 NOTE — Progress Notes (Addendum)
[facility] Behavioral [facility] [address] Progress Note Edward Lutz MRN: [id number] DOB: 1981-11-24 Age: 32 y.o.  Date: 12/30/2012 Start Time: 11:55 AM End Time: 12:30 PM  Chief Complaint: Chief Complaint  Patient presents with  . Depression  . Follow-up  . Medication Refill    Subjective: "I'm taking 5 of the Tegretol".  Wife seems to feel that he is doing some better than on Lithium. She adds that he doesn't have near as many outbursts as on Lithium  History of present illness Patient came for his appointment with his wife.  Pt reports that he is compliant with the psychotropic medications with fair benefit and no noticeable side effects.  Wife notes that he is better than on Lithium in his whole disposition. He seems a lot more stable.  He still has a lot of ups and downs, but comparatively more stable.  He has been taking more Xanax because when he gets frustrated or irritable, he pops a couple of Xanaxs.  His memory has been unchanged on the Aricept.  His pain management is about the same.  He reports that is mind gets to racing and paranoia sets in when he has alone or down/inactive time.  On the 5 a day at [facility] will get a level today and see if he can tolerate more or if I will need to add Risperdal for his anxiety and racing thought management.   Current psychiatric medication Tegretol XR 200mg  5 at HS Wellbutrin XL 450mg  every AM Xanax 1 mg up to 3 times a day prescribed by primary care physician Dr. Philipp Deputy.  Past psychiatric [facility] after abusing [facility] with suicidal plan to shoot himself.  Alcohol and substance use history Patient has history of using crack and cocaine in the past.   Family history: family history includes ADD / ADHD in his daughter; Alcohol abuse in his father; Anxiety disorder in his father, mother, and sister; Bipolar disorder in his father and mother; Depression in his sister; Drug abuse in his father and  mother; Paranoid behavior in his father and mother; and Physical abuse in his father and mother.  There is no history of Dementia, and OCD, and Schizophrenia, and Seizures, and Sexual abuse, .  Medical history Chronic back pain obesity hypertension  Mental status examination Patient is casually dressed and fairly groomed. He maintained good eye contact. He is pleasant and cooperative. Denies any active or passive suicidal thoughts or homicidal thoughts. He denies any auditory or visual hallucination. His paranoia is less intense and less frequent. He described his mood is good and his affect is bright.  He has some mild shakes. He's alert and oriented x3. His insight judgment and impulse control is okay  Assessment Axis I Bipolar disorder with psychotic features, polysubstance dependence in partial remission, chronic traumatic encephaolopathy Axis II deferred Axis III see medical history Axis IV moderate  Plan I took his vitals.  I reviewed CC, tobacco/med/surg Hx, meds effects/ side effects, problem list, therapies and responses as well as current situation/symptoms discussed options. See orders and pt instructions for more details.  Edward Aloe, MD, [facility]  Addendum: 12/31/2012 Teg level not at maximum will push to 1 in AM and 5 at HS. Vitamin D low at 17 mood healthy is 50-100 will replace and start Os Cal to help hum absorb the Vitamin D replacement. Pt informed of this by phone and encouraged pt to make appointment in 6 weeks. Pt inquired  about the liver enzymes and they were better on the Tegretol than they were last year.  At 14 and 22 compared to 16 and 30 last Feb. Edward Aloe, MD, [facility]

## 2012-12-31 ENCOUNTER — Telehealth (HOSPITAL_COMMUNITY): Payer: Self-pay | Admitting: Psychiatry

## 2012-12-31 LAB — VITAMIN D 25 HYDROXY (VIT D DEFICIENCY, FRACTURES): Vit D, 25-Hydroxy: 17 ng/mL — ABNORMAL LOW (ref 30–89)

## 2012-12-31 MED ORDER — CALCIUM CARBONATE-VITAMIN D 500-200 MG-UNIT PO TABS: 1.0000 | ORAL_TABLET | Freq: Two times a day (BID) | ORAL | Status: DC

## 2012-12-31 MED ORDER — ERGOCALCIFEROL 1.25 MG (50000 UT) PO CAPS: 50000.0000 [IU] | ORAL_CAPSULE | ORAL | Status: DC

## 2012-12-31 NOTE — Addendum Note (Signed)
Addended by: Mike Craze on: 12/31/2012 01:17 PM   Modules accepted: Orders

## 2012-12-31 NOTE — Addendum Note (Signed)
Addended by: Mike Craze on: 12/31/2012 03:55 PM   Modules accepted: Orders

## 2013-02-10 ENCOUNTER — Ambulatory Visit (INDEPENDENT_AMBULATORY_CARE_PROVIDER_SITE_OTHER): Payer: Medicare Other | Admitting: Psychiatry

## 2013-02-10 ENCOUNTER — Encounter (HOSPITAL_COMMUNITY): Payer: Self-pay | Admitting: Psychiatry

## 2013-02-10 VITALS — Wt 242.4 lb

## 2013-02-10 DIAGNOSIS — R413 Other amnesia: Secondary | ICD-10-CM

## 2013-02-10 DIAGNOSIS — F319 Bipolar disorder, unspecified: Secondary | ICD-10-CM

## 2013-02-10 DIAGNOSIS — E559 Vitamin D deficiency, unspecified: Secondary | ICD-10-CM

## 2013-02-10 DIAGNOSIS — R52 Pain, unspecified: Secondary | ICD-10-CM

## 2013-02-10 DIAGNOSIS — F0781 Postconcussional syndrome: Secondary | ICD-10-CM

## 2013-02-10 MED ORDER — CARBAMAZEPINE ER 200 MG PO TB12: ORAL_TABLET | ORAL | Status: DC

## 2013-02-10 MED ORDER — DONEPEZIL HCL 10 MG PO TABS: 10.0000 mg | ORAL_TABLET | Freq: Every day | ORAL | Status: DC

## 2013-02-10 MED ORDER — BUPROPION HCL ER (XL) 450 MG PO TB24: 450.0000 mg | ORAL_TABLET | Freq: Every day | ORAL | Status: DC

## 2013-02-10 NOTE — Patient Instructions (Signed)
Yoga is a very helpful exercise method.  On TV, on line, or by DVD Edward Lutz is a source of high quality information about yoga and videos on yoga.  Edward Lutz is the world's number one video yoga instructor according to some experts.  There are exceptional health benefits that can be achieved through yoga.  The main principles of yoga is acceptance, no competition, no comparison, and no judgement.  It is exceptional in helping people meditate and get to a very relaxed state.

## 2013-02-10 NOTE — Progress Notes (Signed)
[facility] Behavioral [facility] [address] Progress Note Edward Lutz MRN: [id number] DOB: Sep 17, 1981 Age: 32 y.o.  Date: 02/10/2013 Start Time: 9:24 AM End Time: 9:45 AM  Chief Complaint: Chief Complaint  Patient presents with  . Depression  . Follow-up  . Medication Refill    Subjective: "I'm taking 6 of the Tegretol.  That seems to be leveled out pretty good.  I donl;t seem to be having any episodes or anything".  History of present illness Patient came for his follow-upappointment  Pt reports that he is compliant with the psychotropic medications with good benefit and no noticeable side effects.  Wife send word that he seems to be leveled out and doing pretty good.  Pt feels that he is doing pretty good.  He feels that for the last month he has done better than the month before and the month before that.  He says that his trigger gets tripped, but nothing like it was.  He and his wife have not been fighting in over a month.  He wants to have his script printed out so he can keep track of them.    Vitamin D was extremely low.  He is on replacement.   Discussed his using a spiritual support to help him stay straight.  Current psychiatric medication Tegretol XR 200mg  1 in AM and 5 at HS Wellbutrin XL 150mg  3 every AM Lovasa twice a day Aricept 10 mg daily Vitamin D 50,000 I.U. Every Monday AM OsCal twice a day. Xanax 1 mg up to 3 times a day prescribed by primary care physician Dr. Loney Hering.  Past psychiatric history [facility] after abusing [facility] with suicidal plan to shoot himself.  Alcohol and substance use history Patient has history of using crack and cocaine in the past.   Family history: family history includes ADD / ADHD in his daughter; Alcohol abuse in his father; Anxiety disorder in his father, mother, and sister; Bipolar disorder in his father and mother; Depression in his sister; Drug abuse in his father and mother; Paranoid  behavior in his father and mother; and Physical abuse in his father and mother.  There is no history of Dementia, and OCD, and Schizophrenia, and Seizures, and Sexual abuse, .  Medical history Chronic back pain obesity hypertension  Mental status examination Patient is casually dressed and fairly groomed. He maintained good eye contact. He is pleasant and cooperative. Denies any active or passive suicidal thoughts or homicidal thoughts. He denies any auditory or visual hallucination. His paranoia is less intense and less frequent. He described his mood is good and his affect is bright.  He has some mild shakes. He's alert and oriented x3. His insight judgment and impulse control is okay  Lab Results:  Results for orders placed in visit on 12/30/12 (from the past 8736 hour(s))  COMPREHENSIVE METABOLIC PANEL   Collection Time    12/30/12 12:21 PM      Result Value Range   Sodium 140  135 - 145 mEq/L   Potassium 4.4  3.5 - 5.3 mEq/L   Chloride 104  96 - 112 mEq/L   CO2 25  19 - 32 mEq/L   Glucose, Bld 101 (*) 70 - 99 mg/dL   BUN 15  6 - 23 mg/dL   Creat 6.57  8.46 - 9.62 mg/dL   Total Bilirubin 0.3  0.3 - 1.2 mg/dL   Alkaline Phosphatase 101  39 - 117 U/L   AST 14  0 - 37 U/L   ALT 22  0 - 53 U/L   Total Protein 7.6  6.0 - 8.3 g/dL   Albumin 4.8  3.5 - 5.2 g/dL   Calcium 9.8  8.4 - 16.1 mg/dL  VITAMIN D 25 HYDROXY   Collection Time    12/30/12 12:21 PM      Result Value Range   Vit D, 25-Hydroxy 17 (*) 30 - 89 ng/mL  HEMOGLOBIN A1C   Collection Time    12/30/12 12:21 PM      Result Value Range   Hemoglobin A1C 5.6  <5.7 %   Mean Plasma Glucose 114  <117 mg/dL  CARBAMAZEPINE LEVEL, TOTAL   Collection Time    12/30/12 12:21 PM      Result Value Range   Carbamazepine Lvl 7.0  4.0 - 12.0 ug/mL   Assessment Axis I Bipolar disorder with psychotic features, polysubstance dependence in partial remission, chronic traumatic encephaolopathy Axis II deferred Axis III see medical  history Axis IV moderate  Plan/Discussion: I took her vitals.  I reviewed CC, tobacco/med/surg Hx, meds effects/ side effects, problem list, therapies and responses as well as current situation/symptoms discussed options. Continue current effective meds. See orders and pt instructions for more details.  Medical Decision Making Problem Points:  Established problem, stable/improving (1), Review of last therapy session (1) and Review of psycho-social stressors (1) Data Points:  Review or order clinical lab tests (1) Review of medication regiment & side effects (2)  I certify that outpatient services furnished can reasonably be expected to improve the patient's condition.   Orson Aloe, MD, [facility]

## 2013-02-12 ENCOUNTER — Other Ambulatory Visit (HOSPITAL_COMMUNITY): Payer: Self-pay | Admitting: Psychiatry

## 2013-02-12 DIAGNOSIS — F0781 Postconcussional syndrome: Secondary | ICD-10-CM

## 2013-02-12 MED ORDER — OMEGA-3-ACID ETHYL ESTERS 1 G PO CAPS: 1.0000 g | ORAL_CAPSULE | Freq: Two times a day (BID) | ORAL | Status: DC

## 2013-02-24 ENCOUNTER — Telehealth (HOSPITAL_COMMUNITY): Payer: Self-pay | Admitting: Psychiatry

## 2013-02-25 ENCOUNTER — Telehealth (HOSPITAL_COMMUNITY): Payer: Self-pay | Admitting: Psychiatry

## 2013-02-25 NOTE — Telephone Encounter (Signed)
S/w pt on phone who was wanting to be sure that I agreed with Dr Sheela Stack assessment that he could never go back to work. Referred that question back to Dr Lolly Mustache as I have not assessed pt for disability purposes.

## 2013-02-25 NOTE — Telephone Encounter (Signed)
Called pt back and the phone cut off and no answer or answering machine.

## 2013-03-02 ENCOUNTER — Telehealth (HOSPITAL_COMMUNITY): Payer: Self-pay | Admitting: *Deleted

## 2013-03-02 NOTE — Telephone Encounter (Signed)
See phone note

## 2013-03-17 ENCOUNTER — Telehealth (HOSPITAL_COMMUNITY): Payer: Self-pay | Admitting: Psychiatry

## 2013-03-17 DIAGNOSIS — E559 Vitamin D deficiency, unspecified: Secondary | ICD-10-CM

## 2013-03-17 NOTE — Telephone Encounter (Signed)
Pharmacy requests Vitamin D by fiax, will have pt get Vitamin D level again.  Left message on answering machine that did identify owner that an order for repeat Vit D level was waiting for him at the office.

## 2013-04-08 ENCOUNTER — Encounter (HOSPITAL_COMMUNITY): Payer: Self-pay | Admitting: Psychiatry

## 2013-04-08 ENCOUNTER — Ambulatory Visit (INDEPENDENT_AMBULATORY_CARE_PROVIDER_SITE_OTHER): Payer: Medicare Other | Admitting: Psychiatry

## 2013-04-08 VITALS — BP 140/92 | Ht 66.0 in | Wt 236.6 lb

## 2013-04-08 DIAGNOSIS — E559 Vitamin D deficiency, unspecified: Secondary | ICD-10-CM

## 2013-04-08 DIAGNOSIS — F429 Obsessive-compulsive disorder, unspecified: Secondary | ICD-10-CM | POA: Insufficient documentation

## 2013-04-08 DIAGNOSIS — R413 Other amnesia: Secondary | ICD-10-CM

## 2013-04-08 DIAGNOSIS — F0781 Postconcussional syndrome: Secondary | ICD-10-CM

## 2013-04-08 DIAGNOSIS — R52 Pain, unspecified: Secondary | ICD-10-CM

## 2013-04-08 DIAGNOSIS — F319 Bipolar disorder, unspecified: Secondary | ICD-10-CM

## 2013-04-08 MED ORDER — CARBAMAZEPINE ER 200 MG PO TB12: ORAL_TABLET | ORAL | Status: DC

## 2013-04-08 MED ORDER — DONEPEZIL HCL 10 MG PO TABS: 10.0000 mg | ORAL_TABLET | Freq: Every day | ORAL | Status: DC

## 2013-04-08 MED ORDER — OMEGA-3-ACID ETHYL ESTERS 1 G PO CAPS: 1.0000 g | ORAL_CAPSULE | Freq: Two times a day (BID) | ORAL | Status: DC

## 2013-04-08 NOTE — Addendum Note (Signed)
Addended by: Mike Craze on: 04/08/2013 09:27 AM   Modules accepted: Orders

## 2013-04-08 NOTE — Progress Notes (Signed)
[facility] [id number] Progress Note ORDELL PRICHETT MRN: [id number] DOB: 15-Jun-1981 Age: 32 y.o.  Date: 04/08/2013 Start Time: 8:37 AM End Time: 9:20 AM  Chief Complaint: Chief Complaint  Patient presents with  . Depression  . Anxiety  . Follow-up  . Medication Refill    Subjective: "I'm stressed out over the disability re evaluation.  It really stressed me out when you said that you didn't know if is was disability". Depression 4/10 and Anxiety 10/10, where 0 is none and 10 is the worst. Pain is 5/10 for his back.  History of present illness Patient came for his follow-upappointment  Pt reports that he is compliant with the psychotropic medications with fair benefit and no noticeable side effects.  Wife came with him today.  She relates that he is doing pretty well, but gets really stuck on things like the disability re eval.  This suggests OCD features.  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  Memory is better with the Aricept.   NEW PROBLEM: OCD  He notes continued struggles with racing thoughts.  Will increase his Tegretol for that.   He has started attending a church where the preacher had been strung out on drugs and he can relate to that.  Cautioned him about the guilt shame and fear that some religions use to control folks.  Will get repeat Vitamin level to see where he is on the current regimen.  Over all assessment:  He is disabled and is unable to function in a competitive work place setting.  His explosive response to the most unsuspecting provocation would end up with him being removed from the property pretty frequently and not comparable with a typical work place stress situation.   Vitals: BP 140/92  Ht 5\' 6"  (1.676 m)  Wt 236 lb 9.6 oz (107.321 kg)  BMI 38.21 kg/m2  Current psychiatric medication Tegretol XR  200mg  1 in AM and 5 at HS Wellbutrin XL 150mg  3 every AM Lovasa 1000 mg twice a day Aricept 10 mg daily Vitamin D 50,000 I.U. Every Monday AM OsCal twice a day. Xanax 1 mg up to 3 times a day prescribed by primary care physician Dr. Loney Hering.  [facility]using [facility] with suicidal plan to shoot himself.  Alcohol and substance use history Patient has history of using crack and cocaine in the past.   Family history: family history includes ADD / ADHD in his daughter; Alcohol abuse in his father; Anxiety disorder in his father, mother, and sister; Bipolar disorder in his father and mother; Depression in his sister; Drug abuse in his father and mother; Paranoid behavior in his father and mother; and Physical abuse in his father and mother.  There is no history of Dementia, and OCD, and Schizophrenia, and Seizures, and Sexual abuse, .  Medical history Past Medical History  Diagnosis Date  . HTN (hypertension)   . Back pain   . OSA (obstructive sleep apnea)   . Bipolar disorder    Mental status examination Patient is casually dressed and fairly groomed. He maintained good eye contact. He is pleasant and cooperative. Denies any active or passive suicidal thoughts or homicidal thoughts. He denies any auditory or visual hallucination. His paranoia is less intense and less frequent. He described his mood is good  and his affect is bright.  He has some mild shakes. He's alert and oriented x3. His insight judgment and impulse control is okay  Lab Results:  Results for orders placed in visit on 12/30/12 (from the past 8736 hour(s))  COMPREHENSIVE METABOLIC PANEL   Collection Time    12/30/12 12:21 PM      Result Value Range   Sodium 140  135 - 145 mEq/L   Potassium 4.4  3.5 - 5.3 mEq/L   Chloride 104  96 - 112 mEq/L   CO2 25  19 - 32 mEq/L   Glucose, Bld 101 (*) 70 - 99 mg/dL   BUN 15  6 - 23 mg/dL   Creat 1.61   0.96 - 0.45 mg/dL   Total Bilirubin 0.3  0.3 - 1.2 mg/dL   Alkaline Phosphatase 101  39 - 117 U/L   AST 14  0 - 37 U/L   ALT 22  0 - 53 U/L   Total Protein 7.6  6.0 - 8.3 g/dL   Albumin 4.8  3.5 - 5.2 g/dL   Calcium 9.8  8.4 - 40.9 mg/dL  VITAMIN D 25 HYDROXY   Collection Time    12/30/12 12:21 PM      Result Value Range   Vit D, 25-Hydroxy 17 (*) 30 - 89 ng/mL  HEMOGLOBIN A1C   Collection Time    12/30/12 12:21 PM      Result Value Range   Hemoglobin A1C 5.6  <5.7 %   Mean Plasma Glucose 114  <117 mg/dL  CARBAMAZEPINE LEVEL, TOTAL   Collection Time    12/30/12 12:21 PM      Result Value Range   Carbamazepine Lvl 7.0  4.0 - 12.0 ug/mL   Assessment Axis I Bipolar disorder with psychotic features, polysubstance dependence in partial remission, chronic traumatic encephaolopathy Axis II deferred Axis III see medical history Axis IV moderate  Plan/Discussion: I took her vitals.  I reviewed CC, tobacco/med/surg Hx, meds effects/ side effects, problem list, therapies and responses as well as current situation/symptoms discussed options. Continue current effective meds. Significant;y focus on stress management. See orders and pt instructions for more details.  Meds ordered this encounter  Medications  . omega-3 acid ethyl esters (LOVAZA) 1 G capsule    Sig: Take 1 capsule (1 g total) by mouth 2 (two) times daily.    Dispense:  60 capsule    Refill:  2  . donepezil (ARICEPT) 10 MG tablet    Sig: Take 1 tablet (10 mg total) by mouth at bedtime.    Dispense:  30 tablet    Refill:  2  . carbamazepine (TEGRETOL XR) 200 MG 12 hr tablet    Sig: Take 1 in AM and 6 at bed if that works better. Or what combination works best.    Dispense:  210 tablet    Refill:  2    Medical Decision Making Problem Points:  Established problem, stable/improving (1), Established problem, worsening (2), Review of last therapy session (1) and Review of psycho-social stressors (1) Data Points:  Review  or order clinical lab tests (1) Review of medication regiment & side effects (2) Review of new medications or change in dosage (2)  I certify that outpatient services furnished can reasonably be expected to improve the patient's condition.   Orson Aloe, MD, [facility]

## 2013-04-08 NOTE — Patient Instructions (Addendum)
"  I am Wishes [employer]" by Marylene Buerger and Lyndal Pulley may be helpful MUSIC for getting to sleep or for meditating You can order it from on line.  You might find the Chill channel on Pandora and explore the artists that you like better.   Relaxation is the ultimate solution for you.  You can seek it through tub baths, bubble baths, essential oils or incense, walking or chatting with friends, listening to soft music, watching a candle burn and just letting all thoughts go and appreciating the true essence of the Creator.  Pets or animals may be very helpful.  You might spend some time with them and then go do more directed meditation.  Specifically work on Optician, dispensing.  Take care of yourself.  No one else is standing up to do the job and only you know what you need.   GET SERIOUS about taking care of yourself.  Do the next right thing and that often means doing something to care for yourself along the lines of are you hungry, are you angry, are you lonely, are you tired, are you scared?  [employer] is what that stands for.  Call if problems or concerns.

## 2013-05-11 ENCOUNTER — Telehealth (HOSPITAL_COMMUNITY): Payer: Self-pay | Admitting: Psychiatry

## 2013-05-11 DIAGNOSIS — F319 Bipolar disorder, unspecified: Secondary | ICD-10-CM

## 2013-05-12 MED ORDER — BUPROPION HCL ER (XL) 450 MG PO TB24: 450.0000 mg | ORAL_TABLET | Freq: Every day | ORAL | Status: DC

## 2013-05-12 NOTE — Telephone Encounter (Signed)
Wellbutrin script sent via eScript

## 2013-06-08 ENCOUNTER — Ambulatory Visit (INDEPENDENT_AMBULATORY_CARE_PROVIDER_SITE_OTHER): Payer: Medicare Other | Admitting: Psychiatry

## 2013-06-08 ENCOUNTER — Encounter (HOSPITAL_COMMUNITY): Payer: Self-pay | Admitting: Psychiatry

## 2013-06-08 ENCOUNTER — Telehealth (HOSPITAL_COMMUNITY): Payer: Self-pay | Admitting: Psychiatry

## 2013-06-08 VITALS — BP 134/84 | Ht 65.75 in | Wt 234.8 lb

## 2013-06-08 DIAGNOSIS — F1911 Other psychoactive substance abuse, in remission: Secondary | ICD-10-CM

## 2013-06-08 DIAGNOSIS — F0781 Postconcussional syndrome: Secondary | ICD-10-CM

## 2013-06-08 DIAGNOSIS — F319 Bipolar disorder, unspecified: Secondary | ICD-10-CM

## 2013-06-08 DIAGNOSIS — F429 Obsessive-compulsive disorder, unspecified: Secondary | ICD-10-CM

## 2013-06-08 DIAGNOSIS — R413 Other amnesia: Secondary | ICD-10-CM

## 2013-06-08 DIAGNOSIS — R52 Pain, unspecified: Secondary | ICD-10-CM

## 2013-06-08 MED ORDER — DONEPEZIL HCL 10 MG PO TABS: 10.0000 mg | ORAL_TABLET | Freq: Every day | ORAL | Status: DC

## 2013-06-08 MED ORDER — CARBAMAZEPINE ER 200 MG PO TB12: ORAL_TABLET | ORAL | Status: DC

## 2013-06-08 MED ORDER — OMEGA-3-ACID ETHYL ESTERS 1 G PO CAPS: 1.0000 g | ORAL_CAPSULE | Freq: Two times a day (BID) | ORAL | Status: DC

## 2013-06-08 MED ORDER — BUPROPION HCL ER (XL) 450 MG PO TB24: 450.0000 mg | ORAL_TABLET | Freq: Every day | ORAL | Status: DC

## 2013-06-08 NOTE — Progress Notes (Signed)
[facility] [id number] Progress Note Edward Lutz MRN: [id number] DOB: Apr 24, 1981 Age: 32 y.o.  Date: 06/08/2013 Start Time: 8:35 AM End Time: 8:59 AM  Chief Complaint: Chief Complaint  Patient presents with  . Depression  . Follow-up  . Medication Refill    Subjective: "I had the disability evaluation and that woman had no personality and she got my head messed up by asking me to count backwards by 7's and who was the governor and all those crazy questions.  I've got to go to the pain doctor today. My pain is up, but I'm doing about as well as I have since I've been coming to you". Depression 5/10 and Anxiety 6/10, where 0 is none and 10 is the worst. Pain is 7/10 for his back.  History of present illness Patient came for his follow-upappointment  Pt reports that he is compliant with the psychotropic medications with good benefit and no noticeable side effects.  Memory is better with the Aricept.  Seems pretty content with how things are going.  Racing thoughts are a whole lot better with the higher dose of Tegretol. Had talkled   Over all assessment:  He is disabled and is unable to function in a competitive work place setting.  His explosive response to the most unsuspecting provocation would end up with him being removed from the property pretty frequently and not comparable with a typical work place stress situation.   Vitals: BP 134/84  Ht 5' 5.75" (1.67 m)  Wt 234 lb 12.8 oz (106.505 kg)  BMI 38.19 kg/m2  Allergies: Allergies  Allergen Reactions  . Penicillins Rash   Medical History: Past Medical History  Diagnosis Date  . HTN (hypertension)   . Back pain   . OSA (obstructive sleep apnea)   . Bipolar disorder    Surgical History: Past Surgical History  Procedure Laterality Date  . Rhinoplasty    . Tonsillectomy     Family History: family history includes ADD / ADHD in his daughter; Alcohol abuse in his father; Anxiety disorder in his father, mother,  and sister; Bipolar disorder in his father and mother; Depression in his sister; Drug abuse in his father and mother; Paranoid behavior in his father and mother; and Physical abuse in his father and mother.  There is no history of Dementia, and OCD, and Schizophrenia, and Seizures, and Sexual abuse, . Reviewed and nothing new today.  Current psychiatric medication Tegretol XR 200mg  7 at HS Wellbutrin XL 150mg  3 every AM Lovasa 1000 mg twice a day Aricept 10 mg at night Vitamin D 50,000 I.U. Every Monday AM OsCal twice a day. Xanax 1 mg up to 3 times a day prescribed by primary care physician Dr. Loney Hering.  [facility] after abusing [facility] with suicidal plan to shoot himself.  Alcohol and substance use history Patient has history of using crack and cocaine in the past.   Mental status examination Patient is casually dressed and fairly groomed. He maintained good eye contact. He is pleasant and cooperative. Denies any active or passive suicidal thoughts or homicidal thoughts. He denies any auditory or visual hallucination. His paranoia is less intense and less frequent. He described his mood is good and his affect is bright.  He has some mild shakes. He's alert and oriented x3. His insight judgment and impulse control is okay  Lab Results:  Results for orders placed in visit on 12/30/12 (from the past  8736 hour(s))  COMPREHENSIVE METABOLIC PANEL   Collection Time    12/30/12 12:21 PM      Result Value Range   Sodium 140  135 - 145 mEq/L   Potassium 4.4  3.5 - 5.3 mEq/L   Chloride 104  96 - 112 mEq/L   CO2 25  19 - 32 mEq/L   Glucose, Bld 101 (*) 70 - 99 mg/dL   BUN 15  6 - 23 mg/dL   Creat 1.61  0.96 - 0.45 mg/dL   Total Bilirubin 0.3  0.3 - 1.2 mg/dL   Alkaline Phosphatase 101  39 - 117 U/L   AST 14  0 - 37 U/L   ALT 22  0 - 53 U/L   Total Protein 7.6  6.0 - 8.3 g/dL   Albumin 4.8  3.5 - 5.2 g/dL   Calcium 9.8   8.4 - 40.9 mg/dL  VITAMIN D 25 HYDROXY   Collection Time    12/30/12 12:21 PM      Result Value Range   Vit D, 25-Hydroxy 17 (*) 30 - 89 ng/mL  HEMOGLOBIN A1C   Collection Time    12/30/12 12:21 PM      Result Value Range   Hemoglobin A1C 5.6  <5.7 %   Mean Plasma Glucose 114  <117 mg/dL  CARBAMAZEPINE LEVEL, TOTAL   Collection Time    12/30/12 12:21 PM      Result Value Range   Carbamazepine Lvl 7.0  4.0 - 12.0 ug/mL   Assessment Axis I Bipolar disorder with psychotic features, polysubstance dependence in partial remission, chronic traumatic encephaolopathy Axis II deferred Axis III see medical history Axis IV moderate  Plan/Discussion: I took her vitals.  I reviewed CC, tobacco/med/surg Hx, meds effects/ side effects, problem list, therapies and responses as well as current situation/symptoms discussed options. Continue current effective meds. Again significanty focus on stress management. See orders and pt instructions for more details.  Meds ordered this encounter  Medications  . omega-3 acid ethyl esters (LOVAZA) 1 G capsule    Sig: Take 1 capsule (1 g total) by mouth 2 (two) times daily.    Dispense:  60 capsule    Refill:  2  . donepezil (ARICEPT) 10 MG tablet    Sig: Take 1 tablet (10 mg total) by mouth at bedtime.    Dispense:  30 tablet    Refill:  2  . carbamazepine (TEGRETOL XR) 200 MG 12 hr tablet    Sig: Take 1 in AM and 6 at bed if that works better. Or what combination works best.    Dispense:  210 tablet    Refill:  2  . BuPROPion HCl ER, XL, 450 MG TB24    Sig: Take 450 mg by mouth daily.    Dispense:  30 tablet    Refill:  2    Medical Decision Making Problem Points:  Established problem, stable/improving (1), Established problem, worsening (2), Review of last therapy session (1) and Review of psycho-social stressors (1) Data Points:  Review or order clinical lab tests (1) Review of medication regiment & side effects (2) Review of new  medications or change in dosage (2)  I certify that outpatient services furnished can reasonably be expected to improve the patient's condition.   Orson Aloe, MD, [facility]

## 2013-06-08 NOTE — Patient Instructions (Signed)
Keep managing the stress.  CREAT something besides a mess. ;)  Call if problems or concerns.

## 2013-06-09 NOTE — Telephone Encounter (Signed)
Left a message as per pt request

## 2013-08-30 ENCOUNTER — Ambulatory Visit (INDEPENDENT_AMBULATORY_CARE_PROVIDER_SITE_OTHER): Payer: Medicare Other | Admitting: Psychiatry

## 2013-08-30 ENCOUNTER — Encounter (HOSPITAL_COMMUNITY): Payer: Self-pay | Admitting: Psychiatry

## 2013-08-30 ENCOUNTER — Ambulatory Visit (HOSPITAL_COMMUNITY): Payer: Self-pay | Admitting: Psychiatry

## 2013-08-30 VITALS — BP 160/100 | Ht 66.0 in | Wt 229.0 lb

## 2013-08-30 DIAGNOSIS — R413 Other amnesia: Secondary | ICD-10-CM

## 2013-08-30 DIAGNOSIS — F0781 Postconcussional syndrome: Secondary | ICD-10-CM

## 2013-08-30 DIAGNOSIS — F319 Bipolar disorder, unspecified: Secondary | ICD-10-CM

## 2013-08-30 MED ORDER — OMEGA-3-ACID ETHYL ESTERS 1 G PO CAPS: 1.0000 g | ORAL_CAPSULE | Freq: Two times a day (BID) | ORAL | Status: DC

## 2013-08-30 MED ORDER — BUPROPION HCL ER (XL) 450 MG PO TB24: 450.0000 mg | ORAL_TABLET | Freq: Every day | ORAL | Status: DC

## 2013-08-30 MED ORDER — DONEPEZIL HCL 10 MG PO TABS: 10.0000 mg | ORAL_TABLET | Freq: Every day | ORAL | Status: DC

## 2013-08-30 MED ORDER — CARBAMAZEPINE ER 200 MG PO TB12: ORAL_TABLET | ORAL | Status: DC

## 2013-08-30 NOTE — Progress Notes (Signed)
Patient ID: Edward Lutz, male   DOB: May 27, 1981, 32 y.o.   MRN: [id number] [facility] [id number] Progress Note Edward Lutz MRN: [id number] DOB: 09-18-1981 Age: 32 y.o.  Date: 08/30/2013 Start Time: 8:35 AM End Time: 8:59 AM  Chief Complaint: Chief Complaint  Patient presents with  . Depression  . Medication Refill  . Memory Loss  . Follow-up  Subjective: "I'm doing well."  This patient is a 32 year old married white male who lives with his wife, a daughter 6 years old and a boy 56 years old in [place]. He is on disability.  The patient states that he has a history of both substance abuse and bipolar disorder. About 10 years ago he was heavily using crack cocaine and marijuana and alcohol. He developed severe mood issues and was diagnosed as bipolar. He was hospitalized twice in 2009 at [place] [facility] for suicide attempts. He he still has significant problems with short-term memory and temper but he feels he is more stable and he's ever been. He thinks his current combination of medicines has been very helpful. His mood is stable and he is able to take care of his family while his wife works and he is sleeping well.   Vitals: BP 160/100  Ht 5\' 6"  (1.676 m)  Wt 229 lb (103.874 kg)  BMI 36.98 kg/m2  Allergies: Allergies  Allergen Reactions  . Penicillins Rash   Medical History: Past Medical History  Diagnosis Date  . HTN (hypertension)   . Back pain   . OSA (obstructive sleep apnea)   . Bipolar disorder    Surgical History: Past Surgical History  Procedure Laterality Date  . Rhinoplasty    . Tonsillectomy     Family History: family history includes ADD / ADHD in his daughter; Alcohol abuse in his father; Anxiety disorder in his father, mother, and sister; Bipolar disorder in his father and mother; Depression in his sister; Drug abuse in his father and mother; Paranoid behavior in his father and mother; Physical abuse in his father and  mother. There is no history of Dementia, OCD, Schizophrenia, Seizures, or Sexual abuse. Reviewed and nothing new today.  Current psychiatric medication Tegretol XR 200mg  7 at HS Wellbutrin XL 150mg  3 every AM Lovasa 1000 mg twice a day Aricept 10 mg at night Vitamin D 50,000 I.U. Every Monday AM OsCal twice a day. Xanax 1 mg up to 3 times a day prescribed by primary care physician Dr. Loney Hering.  [facility] after abusing cocaine with suicidal plan to shoot himself.  Alcohol and substance use history Patient has history of using crack and cocaine in the past.   Mental status examination Patient is casually dressed and fairly groomed. He maintained good eye contact. He is pleasant and cooperative. Denies any active or passive suicidal thoughts or homicidal thoughts. He denies any auditory or visual hallucination. His paranoia is less intense and less frequent. He described his mood is good and his affect is bright.  He has some mild shakes. He's alert and oriented x3. His insight judgment and impulse control is okay  Lab Results:  Results for orders placed in visit on 12/30/12 (from the past 8736 hour(s))  COMPREHENSIVE METABOLIC PANEL   Collection Time    12/30/12 12:21 PM      Result Value Range   Sodium 140  135 - 145 mEq/L   Potassium 4.4  3.5 - 5.3 mEq/L  Chloride 104  96 - 112 mEq/L   CO2 25  19 - 32 mEq/L   Glucose, Bld 101 (*) 70 - 99 mg/dL   BUN 15  6 - 23 mg/dL   Creat 1.02  7.25 - 3.66 mg/dL   Total Bilirubin 0.3  0.3 - 1.2 mg/dL   Alkaline Phosphatase 101  39 - 117 U/L   AST 14  0 - 37 U/L   ALT 22  0 - 53 U/L   Total Protein 7.6  6.0 - 8.3 g/dL   Albumin 4.8  3.5 - 5.2 g/dL   Calcium 9.8  8.4 - 44.0 mg/dL  VITAMIN D 25 HYDROXY   Collection Time    12/30/12 12:21 PM      Result Value Range   Vit D, 25-Hydroxy 17 (*) 30 - 89 ng/mL  HEMOGLOBIN A1C   Collection Time    12/30/12 12:21 PM       Result Value Range   Hemoglobin A1C 5.6  <5.7 %   Mean Plasma Glucose 114  <117 mg/dL  CARBAMAZEPINE LEVEL, TOTAL   Collection Time    12/30/12 12:21 PM      Result Value Range   Carbamazepine Lvl 7.0  4.0 - 12.0 ug/mL   Assessment Axis I Bipolar disorder with psychotic features, polysubstance dependence in partial remission, chronic traumatic encephaolopathy Axis II deferred Axis III see medical history Axis IV moderate  Plan/Discussion: I took her vitals.  I reviewed CC, tobacco/med/surg Hx, meds effects/ side effects, problem list, therapies and responses as well as current situation/symptoms discussed options. Continue current effective meds. Again significanty focus on stress management. See orders and pt instructions for more details.  Meds ordered this encounter  Medications  . donepezil (ARICEPT) 10 MG tablet    Sig: Take 1 tablet (10 mg total) by mouth at bedtime.    Dispense:  30 tablet    Refill:  2  . carbamazepine (TEGRETOL XR) 200 MG 12 hr tablet    Sig: Take 1 in AM and 6 at bed if that works better. Or what combination works best.    Dispense:  210 tablet    Refill:  2  . DISCONTD: BuPROPion HCl ER, XL, 450 MG TB24    Sig: Take 450 mg by mouth daily.    Dispense:  30 tablet    Refill:  2  . BuPROPion HCl ER, XL, 450 MG TB24    Sig: Take 450 mg by mouth daily.    Dispense:  30 tablet    Refill:  2  . omega-3 acid ethyl esters (LOVAZA) 1 G capsule    Sig: Take 1 capsule (1 g total) by mouth 2 (two) times daily.    Dispense:  60 capsule    Refill:  2    Medical Decision Making Problem Points:  Established problem, stable/improving (1), Established problem, worsening (2), Review of last therapy session (1) and Review of psycho-social stressors (1) Data Points:  Review or order clinical lab tests (1) Review of medication regiment & side effects (2) Review of new medications or change in dosage (2)  I certify that outpatient services furnished can  reasonably be expected to improve the patient's condition.   [name], MD

## 2013-10-21 ENCOUNTER — Other Ambulatory Visit: Payer: Self-pay

## 2013-11-29 ENCOUNTER — Ambulatory Visit (HOSPITAL_COMMUNITY): Payer: Self-pay | Admitting: Psychiatry

## 2013-11-29 ENCOUNTER — Encounter (HOSPITAL_COMMUNITY): Payer: Self-pay | Admitting: Psychiatry

## 2013-11-29 ENCOUNTER — Ambulatory Visit (INDEPENDENT_AMBULATORY_CARE_PROVIDER_SITE_OTHER): Payer: Medicare Other | Admitting: Psychiatry

## 2013-11-29 VITALS — BP 180/90 | Ht 66.0 in | Wt 223.0 lb

## 2013-11-29 DIAGNOSIS — F29 Unspecified psychosis not due to a substance or known physiological condition: Secondary | ICD-10-CM

## 2013-11-29 DIAGNOSIS — G9349 Other encephalopathy: Secondary | ICD-10-CM

## 2013-11-29 DIAGNOSIS — F0781 Postconcussional syndrome: Secondary | ICD-10-CM

## 2013-11-29 DIAGNOSIS — F319 Bipolar disorder, unspecified: Secondary | ICD-10-CM

## 2013-11-29 DIAGNOSIS — F1921 Other psychoactive substance dependence, in remission: Secondary | ICD-10-CM

## 2013-11-29 MED ORDER — ALPRAZOLAM 2 MG PO TABS: 2.0000 mg | ORAL_TABLET | Freq: Four times a day (QID) | ORAL | Status: DC

## 2013-11-29 MED ORDER — BUPROPION HCL ER (XL) 450 MG PO TB24: 450.0000 mg | ORAL_TABLET | Freq: Every day | ORAL | Status: DC

## 2013-11-29 MED ORDER — CARBAMAZEPINE ER 200 MG PO TB12: ORAL_TABLET | ORAL | Status: DC
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
## 2013-12-06 ENCOUNTER — Telehealth (HOSPITAL_COMMUNITY): Payer: Self-pay | Admitting: Psychiatry

## 2013-12-06 ENCOUNTER — Other Ambulatory Visit (HOSPITAL_COMMUNITY): Payer: Self-pay | Admitting: Psychiatry

## 2013-12-06 DIAGNOSIS — R413 Other amnesia: Secondary | ICD-10-CM

## 2013-12-06 DIAGNOSIS — F0781 Postconcussional syndrome: Secondary | ICD-10-CM

## 2013-12-06 MED ORDER — OMEGA-3-ACID ETHYL ESTERS 1 G PO CAPS: 1.0000 g | ORAL_CAPSULE | Freq: Two times a day (BID) | ORAL | Status: DC

## 2013-12-06 NOTE — Telephone Encounter (Signed)
sent 

## 2013-12-28 ENCOUNTER — Ambulatory Visit (HOSPITAL_COMMUNITY): Payer: Self-pay | Admitting: Psychiatry

## 2013-12-30 ENCOUNTER — Ambulatory Visit (INDEPENDENT_AMBULATORY_CARE_PROVIDER_SITE_OTHER): Payer: Medicare Other | Admitting: Psychiatry

## 2013-12-30 ENCOUNTER — Encounter (HOSPITAL_COMMUNITY): Payer: Self-pay | Admitting: Psychiatry

## 2013-12-30 VITALS — BP 160/90 | Ht 66.0 in | Wt 231.0 lb

## 2013-12-30 DIAGNOSIS — F0781 Postconcussional syndrome: Secondary | ICD-10-CM

## 2013-12-30 DIAGNOSIS — F319 Bipolar disorder, unspecified: Secondary | ICD-10-CM

## 2013-12-30 DIAGNOSIS — F29 Unspecified psychosis not due to a substance or known physiological condition: Secondary | ICD-10-CM

## 2013-12-30 DIAGNOSIS — F1921 Other psychoactive substance dependence, in remission: Secondary | ICD-10-CM

## 2013-12-30 MED ORDER — LURASIDONE HCL 80 MG PO TABS: ORAL_TABLET | ORAL | Status: DC

## 2013-12-30 MED ORDER — BUPROPION HCL ER (XL) 450 MG PO TB24: 450.0000 mg | ORAL_TABLET | Freq: Every day | ORAL | Status: DC

## 2013-12-30 MED ORDER — CARBAMAZEPINE ER 200 MG PO TB12: ORAL_TABLET | ORAL | Status: DC
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
## 2014-02-07 ENCOUNTER — Ambulatory Visit (HOSPITAL_COMMUNITY): Payer: Self-pay | Admitting: Psychiatry

## 2014-02-10 ENCOUNTER — Ambulatory Visit (HOSPITAL_COMMUNITY): Payer: Self-pay | Admitting: Psychiatry

## 2014-02-15 ENCOUNTER — Ambulatory Visit (INDEPENDENT_AMBULATORY_CARE_PROVIDER_SITE_OTHER): Payer: Medicare Other | Admitting: Psychiatry

## 2014-02-15 ENCOUNTER — Encounter (HOSPITAL_COMMUNITY): Payer: Self-pay | Admitting: Psychiatry

## 2014-02-15 VITALS — BP 180/90 | Ht 66.0 in | Wt 231.0 lb

## 2014-02-15 DIAGNOSIS — F0781 Postconcussional syndrome: Secondary | ICD-10-CM

## 2014-02-15 DIAGNOSIS — F319 Bipolar disorder, unspecified: Secondary | ICD-10-CM

## 2014-02-15 DIAGNOSIS — F29 Unspecified psychosis not due to a substance or known physiological condition: Secondary | ICD-10-CM

## 2014-02-15 DIAGNOSIS — F1921 Other psychoactive substance dependence, in remission: Secondary | ICD-10-CM

## 2014-02-15 MED ORDER — BUPROPION HCL ER (XL) 450 MG PO TB24: 450.0000 mg | ORAL_TABLET | Freq: Every day | ORAL | Status: DC

## 2014-02-15 MED ORDER — ALPRAZOLAM 2 MG PO TABS: 2.0000 mg | ORAL_TABLET | Freq: Four times a day (QID) | ORAL | Status: DC

## 2014-02-15 MED ORDER — LURASIDONE HCL 80 MG PO TABS: ORAL_TABLET | ORAL | Status: DC

## 2014-02-15 MED ORDER — CARBAMAZEPINE ER 200 MG PO TB12: ORAL_TABLET | ORAL | Status: DC
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
## 2014-03-07 ENCOUNTER — Telehealth (HOSPITAL_COMMUNITY): Payer: Self-pay | Admitting: *Deleted

## 2014-03-08 NOTE — Telephone Encounter (Signed)
done

## 2014-04-12 ENCOUNTER — Ambulatory Visit (INDEPENDENT_AMBULATORY_CARE_PROVIDER_SITE_OTHER): Payer: Medicare Other | Admitting: Psychiatry

## 2014-04-12 ENCOUNTER — Encounter (HOSPITAL_COMMUNITY): Payer: Self-pay | Admitting: Psychiatry

## 2014-04-12 VITALS — BP 160/90 | Ht 66.0 in | Wt 229.0 lb

## 2014-04-12 DIAGNOSIS — F0781 Postconcussional syndrome: Secondary | ICD-10-CM

## 2014-04-12 DIAGNOSIS — R413 Other amnesia: Secondary | ICD-10-CM

## 2014-04-12 DIAGNOSIS — F29 Unspecified psychosis not due to a substance or known physiological condition: Secondary | ICD-10-CM

## 2014-04-12 DIAGNOSIS — F1921 Other psychoactive substance dependence, in remission: Secondary | ICD-10-CM

## 2014-04-12 DIAGNOSIS — F319 Bipolar disorder, unspecified: Secondary | ICD-10-CM

## 2014-04-12 MED ORDER — OMEGA-3-ACID ETHYL ESTERS 1 G PO CAPS: 1.0000 g | ORAL_CAPSULE | Freq: Two times a day (BID) | ORAL | Status: DC

## 2014-04-12 MED ORDER — CARBAMAZEPINE ER 200 MG PO TB12: ORAL_TABLET | ORAL | Status: DC

## 2014-04-12 MED ORDER — BUPROPION HCL ER (XL) 450 MG PO TB24: 450.0000 mg | ORAL_TABLET | Freq: Every day | ORAL | Status: DC

## 2014-04-12 MED ORDER — LURASIDONE HCL 120 MG PO TABS: 120.0000 mg | ORAL_TABLET | Freq: Every day | ORAL | Status: DC

## 2014-04-12 MED ORDER — ALPRAZOLAM 2 MG PO TABS: 2.0000 mg | ORAL_TABLET | Freq: Four times a day (QID) | ORAL | Status: DC
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
## 2014-05-21 ENCOUNTER — Emergency Department (HOSPITAL_COMMUNITY): Payer: Medicare Other

## 2014-05-21 ENCOUNTER — Emergency Department (HOSPITAL_COMMUNITY)
Admission: EM | Admit: 2014-05-21 | Discharge: 2014-05-21 | Disposition: A | Discharge status: Home / Self Care | Payer: Medicare Other | Attending: Emergency Medicine | Admitting: Emergency Medicine

## 2014-05-21 ENCOUNTER — Encounter (HOSPITAL_COMMUNITY): Payer: Self-pay | Admitting: Emergency Medicine

## 2014-05-21 DIAGNOSIS — Y9389 Activity, other specified: Secondary | ICD-10-CM | POA: Diagnosis not present

## 2014-05-21 DIAGNOSIS — S61209A Unspecified open wound of unspecified finger without damage to nail, initial encounter: Secondary | ICD-10-CM | POA: Diagnosis not present

## 2014-05-21 DIAGNOSIS — I10 Essential (primary) hypertension: Secondary | ICD-10-CM | POA: Insufficient documentation

## 2014-05-21 DIAGNOSIS — W268XXA Contact with other sharp object(s), not elsewhere classified, initial encounter: Secondary | ICD-10-CM | POA: Diagnosis not present

## 2014-05-21 DIAGNOSIS — Z79899 Other long term (current) drug therapy: Secondary | ICD-10-CM | POA: Diagnosis not present

## 2014-05-21 DIAGNOSIS — F319 Bipolar disorder, unspecified: Secondary | ICD-10-CM | POA: Diagnosis not present

## 2014-05-21 DIAGNOSIS — Z880 Allergy status to penicillin: Secondary | ICD-10-CM | POA: Insufficient documentation

## 2014-05-21 DIAGNOSIS — W230XXA Caught, crushed, jammed, or pinched between moving objects, initial encounter: Secondary | ICD-10-CM | POA: Insufficient documentation

## 2014-05-21 DIAGNOSIS — F17200 Nicotine dependence, unspecified, uncomplicated: Secondary | ICD-10-CM | POA: Diagnosis not present

## 2014-05-21 DIAGNOSIS — Y929 Unspecified place or not applicable: Secondary | ICD-10-CM | POA: Insufficient documentation

## 2014-05-21 DIAGNOSIS — Z792 Long term (current) use of antibiotics: Secondary | ICD-10-CM | POA: Diagnosis not present

## 2014-05-21 DIAGNOSIS — S61219A Laceration without foreign body of unspecified finger without damage to nail, initial encounter: Secondary | ICD-10-CM

## 2014-05-21 MED ORDER — CEPHALEXIN 500 MG PO CAPS: 500.0000 mg | ORAL_CAPSULE | Freq: Four times a day (QID) | ORAL | Status: DC

## 2014-05-21 MED ORDER — LIDOCAINE HCL (PF) 1 % IJ SOLN
5.0000 mL | Freq: Once | INTRAMUSCULAR | Status: AC
  Administered 2014-05-21: 5 mL
  Filled 2014-05-21: qty 5

## 2014-05-21 MED ORDER — BACITRACIN ZINC 500 UNIT/GM EX OINT
TOPICAL_OINTMENT | CUTANEOUS | Status: AC
  Filled 2014-05-21: qty 0.9

## 2014-05-21 NOTE — ED Notes (Signed)
Pt states he was moving wood and cut his left index finger

## 2014-05-21 NOTE — Discharge Instructions (Signed)
Return in one week for suture removal. Return sooner for any problems.

## 2014-05-21 NOTE — ED Provider Notes (Signed)
CSN: [id number]     Arrival date & time 05/21/14  1641 History   First MD Initiated Contact with Patient 05/21/14 1738     Chief Complaint  Patient presents with  . Laceration     (Consider location/radiation/quality/duration/timing/severity/associated sxs/prior Treatment) Patient is a 33 y.o. male presenting with skin laceration. The history is provided by the patient.  Laceration Location:  Hand Hand laceration location:  L finger Depth:  Through dermis Quality: avulsion   Bleeding: controlled   Time since incident:  1 hour Injury mechanism: wood. Pain details:    Quality:  Sharp and burning   Severity:  Moderate   Timing:  Constant   Progression:  Unchanged Foreign body present:  Unable to specify Relieved by:  None tried Worsened by:  Movement Tetanus status:  Up to date  Edward Lutz is a 33 y.o. male who presents to the ED with a laceration to the left index finger. He was moving wood and caught his finger between wood and mashed it and cut his finger.  Past Medical History  Diagnosis Date  . HTN (hypertension)   . Back pain   . OSA (obstructive sleep apnea)   . Bipolar disorder    Past Surgical History  Procedure Laterality Date  . Rhinoplasty    . Tonsillectomy     Family History  Problem Relation Age of Onset  . Bipolar disorder Mother   . Drug abuse Mother   . Anxiety disorder Mother   . Paranoid behavior Mother   . Physical abuse Mother   . Bipolar disorder Father   . Alcohol abuse Father   . Drug abuse Father   . Anxiety disorder Father   . Paranoid behavior Father   . Physical abuse Father   . Anxiety disorder Sister   . Depression Sister   . Dementia Neg Hx   . OCD Neg Hx   . Schizophrenia Neg Hx   . Seizures Neg Hx   . Sexual abuse Neg Hx   . ADD / ADHD Daughter    History  Substance Use Topics  . Smoking status: Current Every Day Smoker -- 1.00 packs/day for 15 years    Types: Cigarettes  . Smokeless tobacco: Never Used   Comment: 16-20 cigarettes a day as of 06/08/2013  . Alcohol Use: 3.0 oz/week    5 Cans of beer per week    Review of Systems Negative except as stated in HPI   Allergies  Penicillins  Home Medications   Prior to Admission medications   Medication Sig Start Date End Date Taking? Authorizing Provider  alprazolam Prudy Feeler) 2 MG tablet Take 1 tablet (2 mg total) by mouth 4 (four) times daily. 04/12/14  Yes [name], MD  amLODipine (NORVASC) 10 MG tablet Take 10 mg by mouth daily.   Yes Historical Provider, MD  buPROPion (WELLBUTRIN SR) 150 MG 12 hr tablet Take 150 mg by mouth 3 (three) times daily.   Yes Historical Provider, MD  carbamazepine (TEGRETOL XR) 200 MG 12 hr tablet Take 200-1,200 mg by mouth 2 (two) times daily. Patient takes 1 tablet in the morning and 6 tablets at bedtime   Yes Historical Provider, MD  cyclobenzaprine (FLEXERIL) 10 MG tablet Take 10 mg by mouth 3 (three) times daily as needed. Muscle spasm 05/05/14  Yes Historical Provider, MD  ibuprofen (ADVIL,MOTRIN) 800 MG tablet Take 800 mg by mouth 3 (three) times daily as needed. pain 05/05/14  Yes Historical Provider, MD  levETIRAcetam (  KEPPRA) 500 MG tablet Take 1,000 mg by mouth 2 (two) times daily.    Yes Historical Provider, MD  Lurasidone HCl (LATUDA) 120 MG TABS Take 1 tablet (120 mg total) by mouth daily with supper. 04/12/14  Yes [name], MD  morphine (MSIR) 30 MG tablet Take 30 mg by mouth every 6 (six) hours as needed. pain   Yes Historical Provider, MD  omega-3 acid ethyl esters (LOVAZA) 1 G capsule Take 1 capsule (1 g total) by mouth 2 (two) times daily. 04/12/14  Yes [name], MD  oxyCODONE-acetaminophen (PERCOCET) 10-325 MG per tablet Take 1 tablet by mouth every 4 (four) hours as needed for pain.   Yes Historical Provider, MD  sulfamethoxazole-trimethoprim (BACTRIM DS) 800-160 MG per tablet Take 1 tablet by mouth daily. 05/20/14  Yes Historical Provider, MD  tretinoin (RETIN-A) 0.05 % cream Apply 1  application topically 2 (two) times daily. 05/20/14  Yes Historical Provider, MD   BP 156/91  Pulse 94  Temp(Src) 98.3 F (36.8 C) (Oral)  Resp 18  Ht 5\' 6"  (1.676 m)  Wt 225 lb (102.059 kg)  BMI 36.33 kg/m2  SpO2 96% Physical Exam  Nursing note and vitals reviewed. Constitutional: He is oriented to person, place, and time. He appears well-developed and well-nourished.  HENT:  Head: Normocephalic.  Eyes: EOM are normal.  Neck: Neck supple.  Cardiovascular: Normal rate.   Pulmonary/Chest: Effort normal.  Musculoskeletal: Normal range of motion.       Left hand: He exhibits tenderness and laceration. He exhibits normal range of motion and normal capillary refill. Normal sensation noted.       Hands: Avulsion laceration to the left index finger s/p injury just prior to arrival to the ED. Full flexion and extension of the finger, good strength.   Neurological: He is alert and oriented to person, place, and time. No cranial nerve deficit.  Skin: Skin is warm and dry.  Psychiatric: He has a normal mood and affect. His behavior is normal.    ED Course  Procedures (including critical care time) LACERATION REPAIR Performed by: Hope Orlene Och Authorized by: Hope Orlene Och Consent: Verbal consent obtained. Risks and benefits: risks, benefits and alternatives were discussed Consent given by: patient Patient identity confirmed: provided demographic data Prepped and Draped in normal sterile fashion Wound explored  Laceration Location: left index finger  Laceration Length: 2 cm  No Foreign Bodies seen or palpated  Anesthesia: local infiltration  Local anesthetic: lidocaine 1% without epinephrine  Anesthetic total: 3 ml  Irrigation method: syringe Amount of cleaning: standard  Skin closure: 5-0 prolene  Number of sutures: 4  Technique: interrupted  Patient tolerance: Patient tolerated the procedure well with no immediate complications.    Imaging Review Dg Finger Index  Left  05/21/2014   CLINICAL DATA:  Pain post trauma  EXAM: LEFT SECOND FINGER 2+V  COMPARISON:  None.  FINDINGS: Frontal, oblique, and lateral views were obtained. There is no fracture or dislocation. Joint spaces appear intact. No radiopaque foreign body.  IMPRESSION: No abnormality noted.   Electronically Signed   By: Bretta Bang M.D.   On: 05/21/2014 17:06     MDM  33 y.o. male with laceration to the left index finger s/p injury. Stable for discharge without neurovascular deficits. He does not want pain medication because he is in a pain management program. Will start antibiotics and he will return here or to his PCP in one week for suture removal. He will return sooner  for any problems.    Medication List    TAKE these medications       cephALEXin 500 MG capsule  Commonly known as:  KEFLEX  Take 1 capsule (500 mg total) by mouth 4 (four) times daily.      ASK your doctor about these medications       alprazolam 2 MG tablet  Commonly known as:  XANAX  Take 1 tablet (2 mg total) by mouth 4 (four) times daily.     amLODipine 10 MG tablet  Commonly known as:  NORVASC  Take 10 mg by mouth daily.     buPROPion 150 MG 12 hr tablet  Commonly known as:  WELLBUTRIN SR  Take 150 mg by mouth 3 (three) times daily.     carbamazepine 200 MG 12 hr tablet  Commonly known as:  TEGRETOL XR  Take 200-1,200 mg by mouth 2 (two) times daily. Patient takes 1 tablet in the morning and 6 tablets at bedtime     cyclobenzaprine 10 MG tablet  Commonly known as:  FLEXERIL  Take 10 mg by mouth 3 (three) times daily as needed. Muscle spasm     ibuprofen 800 MG tablet  Commonly known as:  ADVIL,MOTRIN  Take 800 mg by mouth 3 (three) times daily as needed. pain     levETIRAcetam 500 MG tablet  Commonly known as:  KEPPRA  Take 1,000 mg by mouth 2 (two) times daily.     Lurasidone HCl 120 MG Tabs  Commonly known as:  LATUDA  Take 1 tablet (120 mg total) by mouth daily with supper.      morphine 30 MG tablet  Commonly known as:  MSIR  Take 30 mg by mouth every 6 (six) hours as needed. pain     omega-3 acid ethyl esters 1 G capsule  Commonly known as:  LOVAZA  Take 1 capsule (1 g total) by mouth 2 (two) times daily.     oxyCODONE-acetaminophen 10-325 MG per tablet  Commonly known as:  PERCOCET  Take 1 tablet by mouth every 4 (four) hours as needed for pain.     sulfamethoxazole-trimethoprim 800-160 MG per tablet  Commonly known as:  BACTRIM DS  Take 1 tablet by mouth daily.     tretinoin 0.05 % cream  Commonly known as:  RETIN-A  Apply 1 application topically 2 (two) times daily.            [facility] Orlene OchM Neese, [place]NP 05/21/14 [phone] [id number]

## 2014-05-22 NOTE — ED Provider Notes (Signed)
Medical screening examination/treatment/procedure(s) were performed by non-physician practitioner and as supervising physician I was immediately available for consultation/collaboration.  Kaysia Willard L Oaklee Esther, MD 05/22/14 0014 

## 2014-06-10 ENCOUNTER — Ambulatory Visit (INDEPENDENT_AMBULATORY_CARE_PROVIDER_SITE_OTHER): Payer: Medicare Other | Admitting: Psychiatry

## 2014-06-10 ENCOUNTER — Encounter (HOSPITAL_COMMUNITY): Payer: Self-pay | Admitting: Psychiatry

## 2014-06-10 VITALS — BP 140/82 | Ht 66.0 in | Wt 225.0 lb

## 2014-06-10 DIAGNOSIS — F1921 Other psychoactive substance dependence, in remission: Secondary | ICD-10-CM | POA: Diagnosis not present

## 2014-06-10 DIAGNOSIS — F319 Bipolar disorder, unspecified: Secondary | ICD-10-CM

## 2014-06-10 DIAGNOSIS — F29 Unspecified psychosis not due to a substance or known physiological condition: Secondary | ICD-10-CM | POA: Diagnosis not present

## 2014-06-10 MED ORDER — BUPROPION HCL ER (SR) 150 MG PO TB12: 150.0000 mg | ORAL_TABLET | Freq: Three times a day (TID) | ORAL | Status: DC

## 2014-06-10 MED ORDER — ALPRAZOLAM 2 MG PO TABS: 2.0000 mg | ORAL_TABLET | Freq: Four times a day (QID) | ORAL | Status: DC

## 2014-06-10 MED ORDER — LURASIDONE HCL 120 MG PO TABS: 120.0000 mg | ORAL_TABLET | Freq: Every day | ORAL | Status: DC

## 2014-06-10 MED ORDER — CARBAMAZEPINE ER 200 MG PO TB12: 200.0000 mg | ORAL_TABLET | Freq: Two times a day (BID) | ORAL | Status: DC

## 2014-06-10 NOTE — Progress Notes (Signed)
Patient ID: Edward Lutz, male   DOB: 1981-10-14, 33 y.o.   MRN: [id number] Patient ID: Edward Lutz, male   DOB: 1981-10-14, 33 y.o.   MRN: [id number] Patient ID: Edward Lutz, male   DOB: 1981-10-14, 33 y.o.   MRN: [id number] Patient ID: Edward Lutz, male   DOB: 1981-10-14, 33 y.o.   MRN: [id number] Patient ID: Edward Lutz, male   DOB: 1981-10-14, 33 y.o.   MRN: [id number] Patient ID: Edward Lutz, male   DOB: 1981-10-14, 33 y.o.   MRN: [id number] [facility] [id number] Progress Note Edward Lutz MRN: [id number] DOB: 1981-10-14 Age: 33 y.o.  Date: 06/10/2014 Start Time: 8:35 AM End Time: 8:59 AM  Chief Complaint: Chief Complaint  Patient presents with  . Anxiety  . Depression  . Follow-up  Subjective: "I've been up and down."  This patient is a 33 year old married white male who lives with his wife, a daughter 34 years old and a boy 857 years old in [place]. He is on disability.  The patient states that he has a history of both substance abuse and bipolar disorder. About 10 years ago he was heavily using crack cocaine and marijuana and alcohol. He developed severe mood issues and was diagnosed as bipolar. He was hospitalized twice in 2009 at [facility] for suicide attempts. He he still has significant problems with short-term memory and temper but he feels he is more stable and he's ever been. He thinks his current combination of medicines has been very helpful. His mood is stable and he is able to take care of his family while his wife works and he is sleeping well.  The patient returns after 2 months with his wife he and his wife now have temporary custody of their 112-year-old nephew who is with him today. His sister was reported by DSS probably for her substance use. The patient still worries a lot about his sister his mother and his grandmother. His mood has been more stable however and he has not been violent or angry. He spends a lot of time with  his nephew because "he needs the structure." His blood pressure has improved and his primary doctor has put him on Norvasc Vitals: BP 140/82  Ht 5\' 6"  (1.676 m)  Wt 225 lb (102.059 kg)  BMI 36.33 kg/m2  Allergies: Allergies  Allergen Reactions  . Penicillins Rash   Medical History: Past Medical History  Diagnosis Date  . HTN (hypertension)   . Back pain   . OSA (obstructive sleep apnea)   . Bipolar disorder    Surgical History: Past Surgical History  Procedure Laterality Date  . Rhinoplasty    . Tonsillectomy     Family History: family history includes ADD / ADHD in his daughter; Alcohol abuse in his father; Anxiety disorder in his father, mother, and sister; Bipolar disorder in his father and mother; Depression in his sister; Drug abuse in his father and mother; Paranoid behavior in his father and mother; Physical abuse in his father and mother. There is no history of Dementia, OCD, Schizophrenia, Seizures, or Sexual abuse. Reviewed and nothing new today.  Current psychiatric medication Tegretol XR 200mg  7 at HS Wellbutrin XL 150mg  3 every AM Lovasa 1000 mg twice a day  Vitamin D 50,000 I.U. Every Monday AM OsCal twice a day. Xanax 1 mg up to 4 times daily Latuda 80 mg at dinner Past [facility] after abusing cocaine with suicidal plan to shoot himself.  Alcohol and substance use history Patient has history of using crack and cocaine in the past.   Mental status examination Patient is casually dressed and fairly groomed. He maintained fair eye contact. He is less irritable today. He is obviously worried about his family  He denies any auditory or visual hallucination. He denies paranoia. He denies any thoughts of suicide or self-harm. He has no shaking or tremor He's alert and oriented x3. His insight judgment and impulse control is okay  Lab Results:  Results for orders placed in visit on 11/29/13  (from the past 8736 hour(s))  CARBAMAZEPINE LEVEL, TOTAL   Collection Time    11/29/13  8:38 AM      Result Value Ref Range   Carbamazepine Lvl 10.0  4.0 - 12.0 ug/mL   Assessment Axis I Bipolar disorder with psychotic features, polysubstance dependence in partial remission,  Axis II deferred Axis III see medical history Axis IV moderate  Plan/Discussion: I took his vitals.  I reviewed CC, tobacco/med/surg Hx, meds effects/ side effects, problem list, therapies and responses as well as current situation/symptoms discussed options. He'll continue his current effective medications  He'll return in 3 month See orders and pt instructions for more details.  Meds ordered this encounter  Medications  . carbamazepine (TEGRETOL XR) 200 MG 12 hr tablet    Sig: Take 1-6 tablets (200-1,200 mg total) by mouth 2 (two) times daily. Patient takes 1 tablet in the morning and 6 tablets at bedtime    Dispense:  210 tablet    Refill:  2  . buPROPion (WELLBUTRIN SR) 150 MG 12 hr tablet    Sig: Take 1 tablet (150 mg total) by mouth 3 (three) times daily.    Dispense:  90 tablet    Refill:  2  . Lurasidone HCl (LATUDA) 120 MG TABS    Sig: Take 1 tablet (120 mg total) by mouth daily with supper.    Dispense:  30 tablet    Refill:  2  . alprazolam (XANAX) 2 MG tablet    Sig: Take 1 tablet (2 mg total) by mouth 4 (four) times daily.    Dispense:  120 tablet    Refill:  2    Medical Decision Making Problem Points:  Established problem, stable/improving (1), Established problem, worsening (2), Review of last therapy session (1) and Review of psycho-social stressors (1) Data Points:  Review or order clinical lab tests (1) Review of medication regiment & side effects (2) Review of new medications or change in dosage (2)  I certify that outpatient services furnished can reasonably be expected to improve the patient's condition.   Diannia RuderOSS, DEBORAH, MD

## 2014-07-05 ENCOUNTER — Other Ambulatory Visit (HOSPITAL_COMMUNITY): Payer: Self-pay | Admitting: Psychiatry

## 2014-08-03 ENCOUNTER — Other Ambulatory Visit (HOSPITAL_COMMUNITY): Payer: Self-pay | Admitting: Psychiatry

## 2014-09-09 ENCOUNTER — Ambulatory Visit (HOSPITAL_COMMUNITY): Payer: Self-pay | Admitting: Psychiatry

## 2014-09-19 ENCOUNTER — Ambulatory Visit (HOSPITAL_COMMUNITY): Payer: Self-pay | Admitting: Psychiatry

## 2014-10-04 ENCOUNTER — Ambulatory Visit (INDEPENDENT_AMBULATORY_CARE_PROVIDER_SITE_OTHER): Payer: Medicare Other | Admitting: Psychiatry

## 2014-10-04 ENCOUNTER — Encounter (HOSPITAL_COMMUNITY): Payer: Self-pay | Admitting: Psychiatry

## 2014-10-04 VITALS — BP 156/106 | HR 89 | Ht 66.0 in | Wt 231.8 lb

## 2014-10-04 DIAGNOSIS — F1921 Other psychoactive substance dependence, in remission: Secondary | ICD-10-CM | POA: Diagnosis not present

## 2014-10-04 DIAGNOSIS — F29 Unspecified psychosis not due to a substance or known physiological condition: Secondary | ICD-10-CM

## 2014-10-04 DIAGNOSIS — F319 Bipolar disorder, unspecified: Secondary | ICD-10-CM | POA: Diagnosis not present

## 2014-10-04 MED ORDER — BUPROPION HCL ER (SR) 150 MG PO TB12: 150.0000 mg | ORAL_TABLET | Freq: Three times a day (TID) | ORAL | Status: DC

## 2014-10-04 MED ORDER — CARBAMAZEPINE ER 200 MG PO TB12: 200.0000 mg | ORAL_TABLET | Freq: Every day | ORAL | Status: DC

## 2014-10-04 MED ORDER — ALPRAZOLAM 2 MG PO TABS: 2.0000 mg | ORAL_TABLET | Freq: Four times a day (QID) | ORAL | Status: DC

## 2014-10-04 MED ORDER — LURASIDONE HCL 120 MG PO TABS: 120.0000 mg | ORAL_TABLET | Freq: Every day | ORAL | Status: DC

## 2014-10-04 NOTE — Progress Notes (Signed)
Patient ID: Edward Lutz, male   DOB: [birth year], 33 y.o.   MRN: [id number] Patient ID: Edward Lutz, male   DOB: [birth year], 33 y.o.   MRN: [id number] Patient ID: Edward Lutz, male   DOB: [birth year], 33 y.o.   MRN: [id number] Patient ID: Edward Lutz, male   DOB: [birth year], 33 y.o.   MRN: [id number] Patient ID: Edward Lutz, male   DOB: [birth year], 33 y.o.   MRN: [id number] Patient ID: Edward Lutz, male   DOB: [birth year], 33 y.o.   MRN: [id number] Patient ID: Edward Lutz, male   DOB: [birth year], 33 y.o.   MRN: [id number] [facility] [id number] Progress Note Edward Lutz MRN: [id number] DOB: [birth year] Age: 33 y.o.  Date: 10/04/2014 Start Time: 8:35 AM End Time: 8:59 AM  Chief Complaint: Chief Complaint  Patient presents with  . Anxiety  . Depression  . Follow-up  Subjective: "I've been up and down."  This patient is a 33 year old married white male who lives with his wife, a daughter 34 years old and a boy 34 years old in [place]. He is on disability.  The patient states that he has a history of both substance abuse and bipolar disorder. About 10 years ago he was heavily using crack cocaine and marijuana and alcohol. He developed severe mood issues and was diagnosed as bipolar. He was hospitalized twice in 2009 at [facility] for suicide attempts. He he still has significant problems with short-term memory and temper but he feels he is more stable and he's ever been. He thinks his current combination of medicines has been very helpful. His mood is stable and he is able to take care of his family while his wife works and he is sleeping well.  The patient returns after 3 months with his wife. He and his wife still have custody of his sisters 34-year-old boy. His sister is heavily into heroin and pain pill addiction. His mother recently overdosed on drugs and had to be hospitalized but she is back using them again. These things really upset the patient  and he became tearful today. He claims his medications are helping but at times he gets a and irritable with his family situation. He's never wanted to participate in counseling. He denies being suicidal. His blood pressure is high again today and I suggested he check it outside the office a few times and let his primary care physician now if remains high BP 156/106  Pulse 89  Ht 5\' 6"  (1.676 m)  Wt 231 lb 12.8 oz (105.144 kg)  BMI 37.43 kg/m2  Allergies: Allergies  Allergen Reactions  . Penicillins Rash   Medical History: Past Medical History  Diagnosis Date  . HTN (hypertension)   . Back pain   . OSA (obstructive sleep apnea)   . Bipolar disorder    Surgical History: Past Surgical History  Procedure Laterality Date  . Rhinoplasty    . Tonsillectomy     Family History: family history includes ADD / ADHD in his daughter; Alcohol abuse in his father; Anxiety disorder in his father, mother, and sister; Bipolar disorder in his father and mother; Depression in his sister; Drug abuse in his father and mother; Paranoid behavior in his father and mother; Physical abuse in his father and mother. There is no history of Dementia, OCD, Schizophrenia, Seizures, or Sexual abuse. Reviewed and nothing new today.  Current psychiatric medication Tegretol XR 200mg  7 at HS Wellbutrin XL 150mg  3  every AM Lovasa 1000 mg twice a day  Vitamin D 50,000 I.U. Every Monday AM OsCal twice a day. Xanax 1 mg up to 4 times daily Latuda 80 mg at dinner Past [facility] history [facility] after abusing [facility] with suicidal plan to shoot himself.  Alcohol and substance use history Patient has history of using crack and cocaine in the past.   Mental status examination Patient is casually dressed and fairly groomed. He maintained fair eye contact. He is tearful when talking about his family. He is obviously worried about his family  He denies any auditory or  visual hallucination. He denies paranoia. He denies any thoughts of suicide or self-harm. He has no shaking or tremor He's alert and oriented x3. His insight judgment and impulse control is okay  Lab Results:  Results for orders placed in visit on 11/29/13 (from the past 8736 hour(s))  CARBAMAZEPINE LEVEL, TOTAL   Collection Time    11/29/13  8:38 AM      Result Value Ref Range   Carbamazepine Lvl 10.0  4.0 - 12.0 ug/mL   Assessment Axis I Bipolar disorder with psychotic features, polysubstance dependence in partial remission,  Axis II deferred Axis III see medical history Axis IV moderate  Plan/Discussion: I took his vitals.  I reviewed CC, tobacco/med/surg Hx, meds effects/ side effects, problem list, therapies and responses as well as current situation/symptoms discussed options. He'll continue his current effective medications  He'll return in 3 month See orders and pt instructions for more details.  Meds ordered this encounter  Medications  . minocycline (MINOCIN,DYNACIN) 100 MG capsule    Sig: Take 100 mg by mouth 2 (two) times daily.  Marland Kitchen. DISCONTD: carbamazepine (TEGRETOL XR) 200 MG 12 hr tablet    Sig: Take 200-1,200 mg by mouth. 7 tablets at bedtime  . buPROPion (WELLBUTRIN SR) 150 MG 12 hr tablet    Sig: Take 1 tablet (150 mg total) by mouth 3 (three) times daily.    Dispense:  90 tablet    Refill:  2  . carbamazepine (TEGRETOL XR) 200 MG 12 hr tablet    Sig: Take 1-6 tablets (200-1,200 mg total) by mouth at bedtime. 7 tablets at bedtime    Dispense:  210 tablet    Refill:  3  . Lurasidone HCl (LATUDA) 120 MG TABS    Sig: Take 1 tablet (120 mg total) by mouth daily with supper.    Dispense:  30 tablet    Refill:  2  . alprazolam (XANAX) 2 MG tablet    Sig: Take 1 tablet (2 mg total) by mouth 4 (four) times daily.    Dispense:  120 tablet    Refill:  2    Medical Decision Making Problem Points:  Established problem, stable/improving (1), Established problem,  worsening (2), Review of last therapy session (1) and Review of psycho-social stressors (1) Data Points:  Review or order clinical lab tests (1) Review of medication regiment & side effects (2) Review of new medications or change in dosage (2)  I certify that outpatient services furnished can reasonably be expected to improve the patient's condition.   Diannia RuderOSS, DEBORAH, MD

## 2014-11-10 ENCOUNTER — Encounter (HOSPITAL_COMMUNITY): Payer: Self-pay | Admitting: Emergency Medicine

## 2014-11-10 ENCOUNTER — Emergency Department (HOSPITAL_COMMUNITY): Payer: Medicare Other

## 2014-11-10 ENCOUNTER — Emergency Department (HOSPITAL_COMMUNITY)
Admission: EM | Admit: 2014-11-10 | Discharge: 2014-11-10 | Disposition: A | Discharge status: Home / Self Care | Payer: Medicare Other | Attending: Emergency Medicine | Admitting: Emergency Medicine

## 2014-11-10 DIAGNOSIS — Z720 Tobacco use: Secondary | ICD-10-CM | POA: Diagnosis not present

## 2014-11-10 DIAGNOSIS — Y9389 Activity, other specified: Secondary | ICD-10-CM | POA: Insufficient documentation

## 2014-11-10 DIAGNOSIS — Z880 Allergy status to penicillin: Secondary | ICD-10-CM | POA: Insufficient documentation

## 2014-11-10 DIAGNOSIS — I10 Essential (primary) hypertension: Secondary | ICD-10-CM | POA: Diagnosis not present

## 2014-11-10 DIAGNOSIS — Z1881 Retained glass fragments: Secondary | ICD-10-CM

## 2014-11-10 DIAGNOSIS — Z79899 Other long term (current) drug therapy: Secondary | ICD-10-CM | POA: Insufficient documentation

## 2014-11-10 DIAGNOSIS — Z792 Long term (current) use of antibiotics: Secondary | ICD-10-CM | POA: Insufficient documentation

## 2014-11-10 DIAGNOSIS — S56221A Laceration of other flexor muscle, fascia and tendon at forearm level, right arm, initial encounter: Secondary | ICD-10-CM

## 2014-11-10 DIAGNOSIS — Y998 Other external cause status: Secondary | ICD-10-CM | POA: Diagnosis not present

## 2014-11-10 DIAGNOSIS — F319 Bipolar disorder, unspecified: Secondary | ICD-10-CM | POA: Diagnosis not present

## 2014-11-10 DIAGNOSIS — Y929 Unspecified place or not applicable: Secondary | ICD-10-CM | POA: Insufficient documentation

## 2014-11-10 DIAGNOSIS — W25XXXA Contact with sharp glass, initial encounter: Secondary | ICD-10-CM | POA: Diagnosis not present

## 2014-11-10 DIAGNOSIS — Z8659 Personal history of other mental and behavioral disorders: Secondary | ICD-10-CM | POA: Insufficient documentation

## 2014-11-10 MED ORDER — LIDOCAINE HCL (PF) 1 % IJ SOLN
INTRAMUSCULAR | Status: AC
  Filled 2014-11-10: qty 5

## 2014-11-10 MED ORDER — CEPHALEXIN 500 MG PO CAPS: 500.0000 mg | ORAL_CAPSULE | Freq: Three times a day (TID) | ORAL | Status: DC

## 2014-11-10 MED ORDER — CEPHALEXIN 500 MG PO CAPS
500.0000 mg | ORAL_CAPSULE | Freq: Once | ORAL | Status: AC
  Administered 2014-11-10: 500 mg via ORAL
  Filled 2014-11-10: qty 1

## 2014-11-10 MED ORDER — POVIDONE-IODINE 10 % EX SOLN
CUTANEOUS | Status: AC
  Filled 2014-11-10: qty 118

## 2014-11-10 MED ORDER — TETANUS-DIPHTH-ACELL PERTUSSIS 5-2.5-18.5 LF-MCG/0.5 IM SUSP
0.5000 mL | Freq: Once | INTRAMUSCULAR | Status: AC
  Administered 2014-11-10: 0.5 mL via INTRAMUSCULAR
  Filled 2014-11-10: qty 0.5

## 2014-11-10 NOTE — ED Notes (Signed)
Pt alert & oriented x4, stable gait. Patient given discharge instructions, paperwork & prescription(s). Patient  instructed to stop at the registration desk to finish any additional paperwork. Patient verbalized understanding. Pt left department w/ no further questions. 

## 2014-11-10 NOTE — Discharge Instructions (Signed)
Call Dr. Carollee Massedhompson's office on Monday to schedule a follow up appointment for further evaluation of the injury to your flexor tendon. Your sutures will need to be removed in 10 days. Return here as needed for problems.

## 2014-11-10 NOTE — ED Notes (Signed)
Pt states he drank one beer, got mad and punched a glass window.  Lac to right arm, Wrapped in towel, bleeding.

## 2014-11-10 NOTE — ED Provider Notes (Signed)
CSN: [id number]     Arrival date & time 11/10/14  1342 History   First MD Initiated Contact with Patient 11/10/14 1352     Chief Complaint  Patient presents with  . Extremity Laceration     (Consider location/radiation/quality/duration/timing/severity/associated sxs/prior Treatment) Patient is a 33 y.o. male presenting with skin laceration. The history is provided by the patient.  Laceration Location:  Shoulder/arm Shoulder/arm laceration location:  R forearm and R hand Depth:  Through muscle Quality: straight   Bleeding: controlled with pressure   Time since incident:  30 minutes Laceration mechanism:  Broken glass Pain details:    Quality:  Burning and aching   Severity:  Moderate   Timing:  Constant Foreign body present:  Unable to specify Worsened by:  Nothing tried Ineffective treatments:  None tried  Edward Lutz is a 33 y.o. male who presents to the ED with a laceration to the right forearm that occurred as a results of his putting his arm through a plate glass window after he got angry.    Past Medical History  Diagnosis Date  . HTN (hypertension)   . Back pain   . OSA (obstructive sleep apnea)   . Bipolar disorder    Past Surgical History  Procedure Laterality Date  . Rhinoplasty    . Tonsillectomy     Family History  Problem Relation Age of Onset  . Bipolar disorder Mother   . Drug abuse Mother   . Anxiety disorder Mother   . Paranoid behavior Mother   . Physical abuse Mother   . Bipolar disorder Father   . Alcohol abuse Father   . Drug abuse Father   . Anxiety disorder Father   . Paranoid behavior Father   . Physical abuse Father   . Anxiety disorder Sister   . Depression Sister   . Dementia Neg Hx   . OCD Neg Hx   . Schizophrenia Neg Hx   . Seizures Neg Hx   . Sexual abuse Neg Hx   . ADD / ADHD Daughter    History  Substance Use Topics  . Smoking status: Current Every Day Smoker -- 1.00 packs/day for 15 years    Types: Cigarettes  .  Smokeless tobacco: Never Used     Comment: 16-20 cigarettes a day as of 06/08/2013  . Alcohol Use: 3.0 oz/week    5 Cans of beer per week    Review of Systems Negative except as stated in HPI   Allergies  Penicillins  Home Medications   Prior to Admission medications   Medication Sig Start Date End Date Taking? Authorizing Provider  alprazolam Prudy Feeler(XANAX) 2 MG tablet Take 1 tablet (2 mg total) by mouth 4 (four) times daily. 10/04/14   [name], MD  amLODipine (NORVASC) 10 MG tablet Take 10 mg by mouth daily.    Historical Provider, MD  buPROPion (WELLBUTRIN SR) 150 MG 12 hr tablet Take 1 tablet (150 mg total) by mouth 3 (three) times daily. 10/04/14   [name], MD  carbamazepine (TEGRETOL XR) 200 MG 12 hr tablet Take 1-6 tablets (200-1,200 mg total) by mouth at bedtime. 7 tablets at bedtime 10/04/14   [name], MD  cephALEXin (KEFLEX) 500 MG capsule Take 1 capsule (500 mg total) by mouth 4 (four) times daily. 05/21/14   Hope Orlene OchM Neese, NP  cyclobenzaprine (FLEXERIL) 10 MG tablet Take 10 mg by mouth 3 (three) times daily as needed. Muscle spasm 05/05/14   Historical Provider, MD  ibuprofen (ADVIL,MOTRIN) 800 MG tablet Take 800 mg by mouth 3 (three) times daily as needed. pain 05/05/14   Historical Provider, MD  levETIRAcetam (KEPPRA) 500 MG tablet Take 1,000 mg by mouth 2 (two) times daily.     Historical Provider, MD  Lurasidone HCl (LATUDA) 120 MG TABS Take 1 tablet (120 mg total) by mouth daily with supper. 10/04/14   [name], MD  minocycline (MINOCIN,DYNACIN) 100 MG capsule Take 100 mg by mouth 2 (two) times daily.    Historical Provider, MD  morphine (MSIR) 30 MG tablet Take 30 mg by mouth 2 (two) times daily. pain    Historical Provider, MD  oxyCODONE-acetaminophen (PERCOCET) 10-325 MG per tablet Take 1 tablet by mouth every 4 (four) hours as needed for pain.    Historical Provider, MD  tretinoin (RETIN-A) 0.05 % cream Apply 1 application topically 2 (two) times daily. 05/20/14    Historical Provider, MD   BP 164/83 mmHg  Pulse 91  Temp(Src) 99 F (37.2 C) (Oral)  Resp 18  Ht 5\' 7"  (1.702 m)  Wt 230 lb (104.327 kg)  BMI 36.01 kg/m2  SpO2 100% Physical Exam  Constitutional: He is oriented to person, place, and time. He appears well-developed and well-nourished. No distress.  HENT:  Head: Normocephalic.  Eyes: Conjunctivae and EOM are normal.  Neck: Neck supple.  Cardiovascular: Normal rate.   Pulmonary/Chest: Effort normal.  Musculoskeletal: Normal range of motion.  Deep laceration of mid forearm right with approximately 70% of flexor tendon lacerated. Full range of motion of the wrist and forearm.  Multiple lacerations to right hand and fingers, neurovascularly intact   Neurological: He is alert and oriented to person, place, and time. He has normal strength. No cranial nerve deficit or sensory deficit.  Radial pulse strong, adequate circulation.   Skin: Skin is warm and dry.  Psychiatric: He has a normal mood and affect. His behavior is normal.  Nursing note and vitals reviewed.   ED Course  Procedures  Dg Forearm Right  11/10/2014   CLINICAL DATA:  Right forearm laceration, pain.  Initial encounter.  EXAM: RIGHT FOREARM - 2 VIEW  COMPARISON:  None.  FINDINGS: A laceration is seen the volar soft tissues of the mid to distal right forearm. No radiopaque foreign body is identified. There is no fracture or dislocation.  IMPRESSION: Laceration without fracture or foreign body.   Electronically Signed   By: Drusilla Kannerhomas  Dalessio M.D.   On: 11/10/2014 14:42   Dg Hand Complete Right  11/10/2014   CLINICAL DATA:  Smashed hand through plate glass window  EXAM: RIGHT HAND - COMPLETE 3+ VIEW  COMPARISON:  None.  FINDINGS: Frontal, oblique, and lateral views were obtained. There is no demonstrable fracture or dislocation. A tiny calcification is noted in the dorsal aspect of the first IP joint, likely of arthropathic etiology. There is a small linear radiopaque foreign  body just dorsal to the mid portion of the first distal phalanx, possibly a small fragment of glass. There is no appreciable joint space narrowing or erosion.  IMPRESSION: Question small focus of glass dorsal to the mid portion of the first distal phalanx. Tiny focus of calcification in the dorsal aspect of the first IP joint, probably of arthropathic etiology. No fracture or dislocation.   Electronically Signed   By: Bretta BangWilliam  Woodruff M.D.   On: 11/10/2014 14:41    Spoke with Dr. Hilda LiasKeeling and he request we call hand surgeon.  Dr. Blinda LeatherwoodPollina spoke with Dr. Janee Mornhompson on  for hand and patient will call the office for follow up LACERATION REPAIR Performed by: NEESE,HOPE Authorized by: NEESE,HOPE Consent: Verbal consent obtained. Risks and benefits: risks, benefits and alternatives were discussed Consent given by: patient Patient identity confirmed: provided demographic data Prepped and Draped in normal sterile fashion Wound explored  Laceration Location: right forearm  Laceration Length: 4 cm  No Foreign Bodies seen or palpated  Anesthesia: local infiltration  Local anesthetic: lidocaine 1% without epinephrine  Anesthetic total: 4 ml  Irrigation method: syringe Amount of cleaning: standard  Sub Q closed with 4-0 vicryl x 2  Skin Closure: 3-0 Prolene  Number of sutures: 10  Technique: interrupted  Patient tolerance: Patient tolerated the procedure well with no immediate complications. Tetanus updated Sterile pressure dressing applied.   Dermabond to superficial lacerations of right hand  MDM  33 y.o. male with deep laceration to the right forearm with flexor tendon involvement stable for discharge without neurovascular deficits at this time. He will follow up with hand. Discussed with the patient clinical and x-ray findings and plan of care and all questioned fully answered. Will start Keflex.     [address] [place], NP 11/10/14 1546  Gilda Crease, MD 11/13/14 [phone]

## 2014-11-10 NOTE — ED Provider Notes (Addendum)
Patient presented to the ER with arm laceration. Patient admits to punching through a window. Patient complains of a laceration in the midportion of his right forearm.  Face to face Exam: HEENT - PERRLA Lungs - CTAB Heart - RRR, no M/R/G Abd - S/NT/ND Neuro - alert, oriented x3. Sensation intact across all III nerve distribution and hand Skin - laceration transverse across the volar aspect of forearm. Single tendon visible with approximately 70-75% laceration. No vascular injury. Musculoskeletal - normal flexion and wrist and all 5 fingers.  Plan: Partial tendon injury visualized in laceration. X-ray to rule out foreign body. Consultation with orthopedics for follow-up.  Addendum: Discussed with Dr. Janee Mornhompson, hand surgery. He agreed with follow-up in the office. I did reevaluate the patient. Median nerve, ulnar nerve, radial nerve function is intact, both sensory and motor.   Gilda Creasehristopher J. Pollina, MD 11/10/14 1415  Gilda Creasehristopher J. Pollina, MD 11/10/14 [phone]

## 2014-12-29 ENCOUNTER — Other Ambulatory Visit (HOSPITAL_COMMUNITY): Payer: Self-pay | Admitting: Psychiatry

## 2014-12-30 ENCOUNTER — Telehealth (HOSPITAL_COMMUNITY): Payer: Self-pay | Admitting: *Deleted

## 2014-12-30 ENCOUNTER — Other Ambulatory Visit (HOSPITAL_COMMUNITY): Payer: Self-pay | Admitting: Psychiatry

## 2014-12-30 MED ORDER — LURASIDONE HCL 120 MG PO TABS: 120.0000 mg | ORAL_TABLET | Freq: Every day | ORAL | Status: DC

## 2014-12-30 NOTE — Telephone Encounter (Signed)
Pt pharmacy requesting refills for pt Xanax and Latuda. Pt medication was last refilled 10-04-14 with 2 refills. Called pt to see if he had any medications left and he stated he last picked both of these medications up from the pharmacy 11-28-14 and he is about to leave for work trip by 4:30pm today and he needed refills to last him until his next visit which is scheduled for 01-04-15. Pt number is [phone]

## 2014-12-30 NOTE — Telephone Encounter (Signed)
Will refill.

## 2015-01-02 ENCOUNTER — Other Ambulatory Visit (HOSPITAL_COMMUNITY): Payer: Self-pay | Admitting: *Deleted

## 2015-01-02 MED ORDER — ALPRAZOLAM 2 MG PO TABS: 2.0000 mg | ORAL_TABLET | Freq: Four times a day (QID) | ORAL | Status: DC

## 2015-01-02 NOTE — Telephone Encounter (Signed)
Per Dr. Tenny Crawoss to fax in pt pharmacy request for his Xanax to his pharmacy. Pt is aware and stated he will go and pick is up medication 12-30-14 around 4:30pm.

## 2015-01-02 NOTE — Telephone Encounter (Signed)
Faxed in pt xanax request to his pharmacy. Pt is aware.

## 2015-01-04 ENCOUNTER — Encounter (HOSPITAL_COMMUNITY): Payer: Self-pay | Admitting: Psychiatry

## 2015-01-04 ENCOUNTER — Ambulatory Visit (INDEPENDENT_AMBULATORY_CARE_PROVIDER_SITE_OTHER): Payer: Medicare Other | Admitting: Psychiatry

## 2015-01-04 VITALS — BP 174/101 | HR 76 | Ht 67.0 in | Wt 225.0 lb

## 2015-01-04 DIAGNOSIS — F319 Bipolar disorder, unspecified: Secondary | ICD-10-CM

## 2015-01-04 DIAGNOSIS — F1921 Other psychoactive substance dependence, in remission: Secondary | ICD-10-CM

## 2015-01-04 MED ORDER — ALPRAZOLAM 2 MG PO TABS: 2.0000 mg | ORAL_TABLET | Freq: Four times a day (QID) | ORAL | Status: DC

## 2015-01-04 MED ORDER — LURASIDONE HCL 120 MG PO TABS: 120.0000 mg | ORAL_TABLET | Freq: Every day | ORAL | Status: DC

## 2015-01-04 MED ORDER — BUPROPION HCL ER (SR) 150 MG PO TB12: 150.0000 mg | ORAL_TABLET | Freq: Three times a day (TID) | ORAL | Status: DC

## 2015-01-04 MED ORDER — CARBAMAZEPINE ER 200 MG PO TB12: ORAL_TABLET | ORAL | Status: DC

## 2015-01-04 NOTE — Progress Notes (Signed)
Patient ID: Edward Lutz Micciche, male   DOB: 07-31-1981, 34 y.o.   MRN: [id number] Patient ID: Edward Lutz Rollyson, male   DOB: 07-31-1981, 34 y.o.   MRN: [id number] Patient ID: Edward Lutz Ponciano, male   DOB: 07-31-1981, 34 y.o.   MRN: [id number] Patient ID: Edward Lutz Borthwick, male   DOB: 07-31-1981, 34 y.o.   MRN: [id number] Patient ID: Edward Lutz Zuch, male   DOB: 07-31-1981, 34 y.o.   MRN: [id number] Patient ID: Edward Lutz [place], male   DOB: 07-31-1981, 34 y.o.   MRN: [id number] Patient ID: Edward Lutz Moscoso, male   DOB: 07-31-1981, 34 y.o.   MRN: [id number] Patient ID: Edward Lutz Pagan, male   DOB: 07-31-1981, 34 y.o.   MRN: [id number] [facility] [id number] Progress Note Edward Lutz Chiou MRN: [id number] DOB: 07-31-1981 Age: 34 y.o.  Date: 01/04/2015 Start Time: 8:35 AM End Time: 8:59 AM  Chief Complaint: Chief Complaint  Patient presents with  . Depression  . Manic Behavior  . Follow-up  Subjective: "I've been doing okay."  This patient is a 34 year old married white male who lives with his wife, a daughter 35 years old and a boy 35 years old in [place]. He is on disability.  The patient states that he has a history of both substance abuse and bipolar disorder. About 10 years ago he was heavily using crack cocaine and marijuana and alcohol. He developed severe mood issues and was diagnosed as bipolar. He was hospitalized twice in 2009 at [facility] for suicide attempts. He he still has significant problems with short-term memory and temper but he feels he is more stable and he's ever been. He thinks his current combination of medicines has been very helpful. His mood is stable and he is able to take care of his family while his wife works and he is sleeping well.  The patient returns after 3 months with his wife. He and his wife still have custody of his sisters 35-year-old boy. His sister is heavily into heroin and pain pill addiction. Nothing is really change with the situation except his  sister came over Christmas and was out of control and the police had to be called to remove her from the home. Overall the patient thinks he is doing well despite all the stress with his family. He sleeping well and his energy is good. He doesn't have any thoughts of hurting self or others BP 174/101 mmHg  Pulse 76  Ht 5\' 7"  (1.702 m)  Wt 225 lb (102.059 kg)  BMI 35.23 kg/m2  Allergies: Allergies  Allergen Reactions  . Penicillins Rash   Medical History: Past Medical History  Diagnosis Date  . HTN (hypertension)   . Back pain   . OSA (obstructive sleep apnea)   . Bipolar disorder    Surgical History: Past Surgical History  Procedure Laterality Date  . Rhinoplasty    . Tonsillectomy     Family History: family history includes ADD / ADHD in his daughter; Alcohol abuse in his father; Anxiety disorder in his father, mother, and sister; Bipolar disorder in his father and mother; Depression in his sister; Drug abuse in his father and mother; Paranoid behavior in his father and mother; Physical abuse in his father and mother. There is no history of Dementia, OCD, Schizophrenia, Seizures, or Sexual abuse. Reviewed and nothing new today.  Current psychiatric medication Tegretol XR 200mg  7 at HS Wellbutrin XL 150mg  3 every AM Lovasa 1000 mg twice a day  Vitamin D 50,000 I.U. Every Monday AM OsCal twice a day. Xanax 1 mg up to 4 times daily Latuda 80 mg at dinner Past [facility] history [facility] after abusing cocaine with suicidal plan to shoot himself.  Alcohol and substance use history Patient has history of using crack and cocaine in the past.   Mental status examination Patient is casually dressed and fairly groomed. He maintained fair eye contact. He is pleasant and cooperative  He denies any auditory or visual hallucination. He denies paranoia. He denies any thoughts of suicide or self-harm. He has no shaking or tremor He's  alert and oriented x3. His insight judgment and impulse control is okay  Lab Results:  No results found for this or any previous visit (from the past 8736 hour(s)). Assessment Axis I Bipolar disorder with psychotic features, polysubstance dependence in partial remission,  Axis II deferred Axis III see medical history Axis IV moderate  Plan/Discussion: I took his vitals.  I reviewed CC, tobacco/med/surg Hx, meds effects/ side effects, problem list, therapies and responses as well as current situation/symptoms discussed options. He'll continue his current effective medications  He'll return in 3 month See orders and pt instructions for more details.  Meds ordered this encounter  Medications  . tiZANidine (ZANAFLEX) 2 MG tablet    Sig: 3 (three) times daily.  . carbamazepine (TEGRETOL XR) 200 MG 12 hr tablet    Sig: Take 7 tablets at bedtime    Dispense:  210 tablet    Refill:  3  . buPROPion (WELLBUTRIN SR) 150 MG 12 hr tablet    Sig: Take 1 tablet (150 mg total) by mouth 3 (three) times daily.    Dispense:  90 tablet    Refill:  3  . Lurasidone HCl (LATUDA) 120 MG TABS    Sig: Take 1 tablet (120 mg total) by mouth daily with supper.    Dispense:  30 tablet    Refill:  3  . alprazolam (XANAX) 2 MG tablet    Sig: Take 1 tablet (2 mg total) by mouth 4 (four) times daily.    Dispense:  120 tablet    Refill:  3    Medical Decision Making Problem Points:  Established problem, stable/improving (1), Established problem, worsening (2), Review of last therapy session (1) and Review of psycho-social stressors (1) Data Points:  Review or order clinical lab tests (1) Review of medication regiment & side effects (2) Review of new medications or change in dosage (2)  I certify that outpatient services furnished can reasonably be expected to improve the patient's condition.   [name], MD

## 2015-04-05 ENCOUNTER — Encounter (HOSPITAL_COMMUNITY): Payer: Self-pay | Admitting: Psychiatry

## 2015-04-05 ENCOUNTER — Ambulatory Visit (INDEPENDENT_AMBULATORY_CARE_PROVIDER_SITE_OTHER): Payer: Medicare Other | Admitting: Psychiatry

## 2015-04-05 VITALS — BP 149/94 | HR 75 | Ht 67.0 in | Wt 213.8 lb

## 2015-04-05 DIAGNOSIS — F1921 Other psychoactive substance dependence, in remission: Secondary | ICD-10-CM | POA: Diagnosis not present

## 2015-04-05 DIAGNOSIS — F319 Bipolar disorder, unspecified: Secondary | ICD-10-CM

## 2015-04-05 MED ORDER — LURASIDONE HCL 120 MG PO TABS: 120.0000 mg | ORAL_TABLET | Freq: Every day | ORAL | Status: DC

## 2015-04-05 MED ORDER — BUPROPION HCL ER (SR) 150 MG PO TB12: 150.0000 mg | ORAL_TABLET | Freq: Three times a day (TID) | ORAL | Status: DC

## 2015-04-05 MED ORDER — ALPRAZOLAM 2 MG PO TABS: 2.0000 mg | ORAL_TABLET | Freq: Four times a day (QID) | ORAL | Status: DC

## 2015-04-05 MED ORDER — CARBAMAZEPINE ER 200 MG PO TB12: ORAL_TABLET | ORAL | Status: DC

## 2015-04-05 NOTE — Progress Notes (Signed)
Patient ID: Edward Lutz, male   DOB: 04/19/1981, 34 y.o.   MRN: [id number] Patient ID: Edward Lutz, male   DOB: October 26, 1981, 34 y.o.   MRN: [id number] Patient ID: Edward Lutz, male   DOB: 10-Jun-1981, 34 y.o.   MRN: [id number] Patient ID: Edward Lutz, male   DOB: [birth year], 34 y.o.   MRN: [id number] Patient ID: Edward Lutz, male   DOB: 1981/04/01, 34 y.o.   MRN: [id number] Patient ID: Edward Lutz, male   DOB: April 06, 1981, 34 y.o.   MRN: [id number] Patient ID: Edward Lutz, male   DOB: [birth year], 34 y.o.   MRN: [id number] Patient ID: Edward Lutz, male   DOB: 05-29-1981, 34 y.o.   MRN: [id number] Patient ID: Edward Lutz, male   DOB: [birth year], 34 y.o.   MRN: [id number] [facility] [address] Progress Note Edward Lutz MRN: [id number] DOB: 12-Feb-1981 Age: 34 y.o.  Date: 04/05/2015 Start Time: 8:35 AM End Time: 8:59 AM  Chief Complaint: Chief Complaint  Patient presents with  . Depression  . Anxiety  . Manic Behavior  . Follow-up  Subjective: "I've been doing okay."  This patient is a 34 year old married white male who lives with his wife, a daughter 33 years old and a boy 73 years old in [place]. He is on disability.  The patient states that he has a history of both substance abuse and bipolar disorder. About 10 years ago he was heavily using crack cocaine and marijuana and alcohol. He developed severe mood issues and was diagnosed as bipolar. He was hospitalized twice in 2009 at [facility] for suicide attempts. He he still has significant problems with short-term memory and temper but he feels he is more stable and he's ever been. He thinks his current combination of medicines has been very helpful. His mood is stable and he is able to take care of his family while his wife works and he is sleeping well.  The patient returns after 3 months with his wife. He and his wife still have custody of his sisters 7-year-old boy. His sister is heavily  into heroin and pain pill addiction. The sister calls periodically and upsets the child. The patient still has his "moments" of anger and irritability but his medicines are keeping him under pretty good control. He still admits that he drinks some beers periodically but not to excess. He also states that he's been sweating excessively and this may be a side effect of Latuda but I will have to look at up and get back to him. He is going to talk to his family doctor about something to stop this. He doesn't want to stop his current medications because he thinks they've kept him pretty stable BP 149/94 mmHg  Pulse 75  Ht  (1.702 m)  Wt 213 lb 12.8 oz (96.979 kg)  BMI 33.48 kg/m2  Allergies: Allergies  Allergen Reactions  . Penicillins Rash   Medical History: Past Medical History  Diagnosis Date  . HTN (hypertension)   . Back pain   . OSA (obstructive sleep apnea)   . Bipolar disorder    Surgical History: Past Surgical History  Procedure Laterality Date  . Rhinoplasty    . Tonsillectomy     Family History: family history includes ADD / ADHD in his daughter; Alcohol abuse in his father; Anxiety disorder in his father, mother, and sister; Bipolar disorder in his father and mother; Depression in his sister; Drug abuse  in his father and mother; Paranoid behavior in his father and mother; Physical abuse in his father and mother. There is no history of Dementia, OCD, Schizophrenia, Seizures, or Sexual abuse. Reviewed and nothing new today.  Current psychiatric medication Tegretol XR  7 at HS Wellbutrin XL  3 every AM Lovasa 1000 mg twice a day  Vitamin D 50,000 I.U. Every Monday AM OsCal twice a day. Xanax 1 mg up to 4 times daily Latuda120mg  at dinner Past [facility]using [facility] with suicidal plan to shoot himself.  Alcohol and substance use history Patient has history of using crack and  cocaine in the past.   Mental status examination Patient is casually dressed and fairly groomed. He maintained fair eye contact. He is pleasant and cooperative but somewhat irritable  He denies any auditory or visual hallucination. He denies paranoia. He denies any thoughts of suicide or self-harm. He has no shaking or tremor He's alert and oriented x3. His insight judgment and impulse control is okay  Lab Results:  No results found for this or any previous visit (from the past 8736 hour(s)). Assessment Axis I Bipolar disorder with psychotic features, polysubstance dependence in partial remission,  Axis II deferred Axis III see medical history Axis IV moderate  Plan/Discussion: I took his vitals.  I reviewed CC, tobacco/med/surg Hx, meds effects/ side effects, problem list, therapies and responses as well as current situation/symptoms discussed options. He'll continue his current effective medications  He'll return in 3 month See orders and pt instructions for more details.  Meds ordered this encounter  Medications  . EMBEDA 60-2.4 MG CPCR    Sig: Take by mouth 2 (two) times daily.   Marland Kitchen tiZANidine (ZANAFLEX) 4 MG tablet    Sig: Take 4 mg by mouth 3 (three) times daily.  Marland Kitchen DISCONTD: Lurasidone HCl (LATUDA) 120 MG TABS    Sig: Take 1 tablet (120 mg total) by mouth daily with supper.    Dispense:  30 tablet    Refill:  3  . DISCONTD: carbamazepine (TEGRETOL XR) 200 MG 12 hr tablet    Sig: Take 7 tablets at bedtime    Dispense:  210 tablet    Refill:  3  . DISCONTD: buPROPion (WELLBUTRIN SR) 150 MG 12 hr tablet    Sig: Take 1 tablet (150 mg total) by mouth 3 (three) times daily.    Dispense:  90 tablet    Refill:  3  . alprazolam (XANAX) 2 MG tablet    Sig: Take 1 tablet (2 mg total) by mouth 4 (four) times daily.    Dispense:  120 tablet    Refill:  3  . Lurasidone HCl (LATUDA) 120 MG TABS    Sig: Take 1 tablet (120 mg total) by mouth daily with supper.    Dispense:  30 tablet     Refill:  3  . carbamazepine (TEGRETOL XR) 200 MG 12 hr tablet    Sig: Take 7 tablets at bedtime    Dispense:  210 tablet    Refill:  3  . buPROPion (WELLBUTRIN SR) 150 MG 12 hr tablet    Sig: Take 1 tablet (150 mg total) by mouth 3 (three) times daily.    Dispense:  90 tablet    Refill:  3    Medical Decision Making Problem Points:  Established problem, stable/improving (1), Established problem, worsening (2), Review of last therapy session (1) and Review of psycho-social  stressors (1) Data Points:  Review or order clinical lab tests (1) Review of medication regiment & side effects (2) Review of new medications or change in dosage (2)  I certify that outpatient services furnished can reasonably be expected to improve the patient's condition.   Diannia RuderOSS, Sharra Cayabyab, MD

## 2015-07-05 ENCOUNTER — Ambulatory Visit (HOSPITAL_COMMUNITY): Payer: Self-pay | Admitting: Psychiatry

## 2015-07-24 ENCOUNTER — Other Ambulatory Visit (HOSPITAL_COMMUNITY): Payer: Self-pay | Admitting: Psychiatry

## 2015-08-03 ENCOUNTER — Ambulatory Visit (INDEPENDENT_AMBULATORY_CARE_PROVIDER_SITE_OTHER): Payer: Medicare Other | Admitting: Psychiatry

## 2015-08-03 ENCOUNTER — Encounter (HOSPITAL_COMMUNITY): Payer: Self-pay | Admitting: Psychiatry

## 2015-08-03 VITALS — BP 120/82 | Ht 67.0 in | Wt 210.0 lb

## 2015-08-03 DIAGNOSIS — F1920 Other psychoactive substance dependence, uncomplicated: Secondary | ICD-10-CM

## 2015-08-03 DIAGNOSIS — F319 Bipolar disorder, unspecified: Secondary | ICD-10-CM | POA: Diagnosis not present

## 2015-08-03 LAB — CBC WITH DIFFERENTIAL/PLATELET
Basophils Absolute: 0.1 10*3/uL (ref 0.0–0.1)
Basophils Relative: 1 % (ref 0–1)
Eosinophils Absolute: 0.1 10*3/uL (ref 0.0–0.7)
Eosinophils Relative: 2 % (ref 0–5)
HCT: 45.6 % (ref 39.0–52.0)
Hemoglobin: 15.6 g/dL (ref 13.0–17.0)
Lymphocytes Relative: 26 % (ref 12–46)
Lymphs Abs: 1.8 10*3/uL (ref 0.7–4.0)
MCH: 31.5 pg (ref 26.0–34.0)
MCHC: 34.2 g/dL (ref 30.0–36.0)
MCV: 91.9 fL (ref 78.0–100.0)
MPV: 9.3 fL (ref 8.6–12.4)
Monocytes Absolute: 0.4 10*3/uL (ref 0.1–1.0)
Monocytes Relative: 6 % (ref 3–12)
Neutro Abs: 4.4 10*3/uL (ref 1.7–7.7)
Neutrophils Relative %: 65 % (ref 43–77)
Platelets: 303 10*3/uL (ref 150–400)
RBC: 4.96 MIL/uL (ref 4.22–5.81)
RDW: 13.6 % (ref 11.5–15.5)
WBC: 6.8 10*3/uL (ref 4.0–10.5)

## 2015-08-03 MED ORDER — BUPROPION HCL ER (SR) 150 MG PO TB12: 150.0000 mg | ORAL_TABLET | Freq: Three times a day (TID) | ORAL | Status: DC

## 2015-08-03 MED ORDER — ALPRAZOLAM 2 MG PO TABS: 2.0000 mg | ORAL_TABLET | Freq: Four times a day (QID) | ORAL | Status: DC

## 2015-08-03 MED ORDER — LURASIDONE HCL 120 MG PO TABS: 120.0000 mg | ORAL_TABLET | Freq: Every day | ORAL | Status: DC

## 2015-08-03 MED ORDER — CARBAMAZEPINE ER 200 MG PO TB12: ORAL_TABLET | ORAL | Status: DC

## 2015-08-03 NOTE — Progress Notes (Signed)
Patient ID: Edward Lutz, male   DOB: 07-06-1981, 34 y.o.   MRN: [id number] Patient ID: Edward Lutz, male   DOB: 1981-03-13, 34 y.o.   MRN: [id number] Patient ID: Edward Lutz, male   DOB: [birth year], 34 y.o.   MRN: [id number] Patient ID: Edward Lutz, male   DOB: 03-26-1981, 34 y.o.   MRN: [id number] Patient ID: Edward Lutz, male   DOB: [birth year], 34 y.o.   MRN: [id number] Patient ID: Edward Lutz, male   DOB: Apr 13, 1981, 34 y.o.   MRN: [id number] Patient ID: Edward Lutz, male   DOB: September 25, 1981, 34 y.o.   MRN: [id number] Patient ID: Edward Lutz, male   DOB: April 09, 1981, 34 y.o.   MRN: [id number] Patient ID: Edward Lutz, male   DOB: [birth year], 34 y.o.   MRN: [id number] Patient ID: Edward Lutz, male   DOB: [birth year], 34 y.o.   MRN: [id number] [facility] [address] Progress Note Edward Lutz MRN: [id number] DOB: [birth year] Age: 34 y.o.  Date: 08/03/2015 Start Time: 8:35 AM End Time: 8:59 AM  Chief Complaint: Chief Complaint  Patient presents with  . Depression  . Anxiety  . Follow-up  Subjective: "I've been doing okay."  This patient is a 34 year old married white male who lives with his wife, a daughter 40 years old and a boy 8 years old in [place]. He is on disability.  The patient states that he has a history of both substance abuse and bipolar disorder. About 10 years ago he was heavily using crack cocaine and marijuana and alcohol. He developed severe mood issues and was diagnosed as bipolar. He was hospitalized twice in 2009 at [facility] for suicide attempts. He he still has significant problems with short-term memory and temper but he feels he is more stable and he's ever been. He thinks his current combination of medicines has been very helpful. His mood is stable and he is able to take care of his family while his wife works and he is sleeping well.  The patient returns after 4 months. He is doing fairly well. His mood is been  stable and he said had significant temper problems. He's been playing a lot with his children over the summer and going on various trips. He only drinks alcohol very occasionally now. He denies any recent temper outbursts. He is sleeping well and his energy is good BP 120/82 mmHg  Ht 5\' 7"  (1.702 m)  Wt 210 lb (95.255 kg)  BMI 32.88 kg/m2  Allergies: Allergies  Allergen Reactions  . Penicillins Rash   Medical History: Past Medical History  Diagnosis Date  . HTN (hypertension)   . Back pain   . OSA (obstructive sleep apnea)   . Bipolar disorder    Surgical History: Past Surgical History  Procedure Laterality Date  . Rhinoplasty    . Tonsillectomy     Family History: family history includes ADD / ADHD in his daughter; Alcohol abuse in his father; Anxiety disorder in his father, mother, and sister; Bipolar disorder in his father and mother; Depression in his sister; Drug abuse in his father and mother; Paranoid behavior in his father and mother; Physical abuse in his father and mother. There is no history of Dementia, OCD, Schizophrenia, Seizures, or Sexual abuse. Reviewed and nothing new today.  Current psychiatric medication Tegretol XR 200mg  7 at HS Wellbutrin XL 150mg  3 every AM Lovasa 1000 mg twice a day  Vitamin D 50,000 I.U. Every  Monday AM OsCal twice a day. Xanax 2 mg up to 4 times daily Latuda120mg  at dinner Past [facility] after abusing [facility] with suicidal plan to shoot himself.  Alcohol and substance use history Patient has history of using crack and cocaine in the past.   Mental status examination Patient is casually dressed and fairly groomed. He maintained fair eye contact. He is pleasant and cooperative and not irritable  He denies any auditory or visual hallucination. He denies paranoia. He denies any thoughts of suicide or self-harm. He has no shaking or tremor He's alert and oriented x3.  His insight judgment and impulse control is okay  Lab Results:  No results found for this or any previous visit (from the past 8736 hour(s)). Assessment Axis I Bipolar disorder with psychotic features, polysubstance dependence in partial remission,  Axis II deferred Axis III see medical history Axis IV moderate  Plan/Discussion: I took his vitals.  I reviewed CC, tobacco/med/surg Hx, meds effects/ side effects, problem list, therapies and responses as well as current situation/symptoms discussed options. He'll continue Wellbutrin for depression, carbamazepine and Latuda for mood stabilization and Xanax for anxiety. He will check a CBC and Tegretol level today He'll return in 4 month See orders and pt instructions for more details.  Meds ordered this encounter  Medications  . buPROPion (WELLBUTRIN SR) 150 MG 12 hr tablet    Sig: Take 1 tablet (150 mg total) by mouth 3 (three) times daily.    Dispense:  90 tablet    Refill:  3  . carbamazepine (TEGRETOL XR) 200 MG 12 hr tablet    Sig: Take 7 tablets at bedtime    Dispense:  210 tablet    Refill:  3  . Lurasidone HCl (LATUDA) 120 MG TABS    Sig: Take 1 tablet (120 mg total) by mouth daily with supper.    Dispense:  30 tablet    Refill:  3  . alprazolam (XANAX) 2 MG tablet    Sig: Take 1 tablet (2 mg total) by mouth 4 (four) times daily.    Dispense:  120 tablet    Refill:  3    Medical Decision Making Problem Points:  Established problem, stable/improving (1), Established problem, worsening (2), Review of last therapy session (1) and Review of psycho-social stressors (1) Data Points:  Review or order clinical lab tests (1) Review of medication regiment & side effects (2) Review of new medications or change in dosage (2)  I certify that outpatient services furnished can reasonably be expected to improve the patient's condition.   [name], MD

## 2015-08-04 LAB — CARBAMAZEPINE LEVEL, TOTAL: CARBAMAZEPINE LVL: 9.1 ug/mL (ref 4.0–12.0)

## 2015-12-01 ENCOUNTER — Ambulatory Visit (HOSPITAL_COMMUNITY): Payer: Self-pay | Admitting: Psychiatry

## 2015-12-15 ENCOUNTER — Ambulatory Visit (INDEPENDENT_AMBULATORY_CARE_PROVIDER_SITE_OTHER): Payer: Medicare Other | Admitting: Psychiatry

## 2015-12-15 ENCOUNTER — Encounter (HOSPITAL_COMMUNITY): Payer: Self-pay | Admitting: Psychiatry

## 2015-12-15 VITALS — BP 148/88 | HR 69 | Ht 67.0 in | Wt 208.4 lb

## 2015-12-15 DIAGNOSIS — F1921 Other psychoactive substance dependence, in remission: Secondary | ICD-10-CM | POA: Diagnosis not present

## 2015-12-15 DIAGNOSIS — F319 Bipolar disorder, unspecified: Secondary | ICD-10-CM | POA: Diagnosis not present

## 2015-12-15 MED ORDER — BUPROPION HCL ER (SR) 150 MG PO TB12: 150.0000 mg | ORAL_TABLET | Freq: Three times a day (TID) | ORAL | Status: DC

## 2015-12-15 MED ORDER — CARBAMAZEPINE ER 200 MG PO TB12: ORAL_TABLET | ORAL | Status: DC

## 2015-12-15 MED ORDER — LURASIDONE HCL 120 MG PO TABS: 120.0000 mg | ORAL_TABLET | Freq: Every day | ORAL | Status: DC

## 2015-12-15 MED ORDER — ALPRAZOLAM 2 MG PO TABS: 2.0000 mg | ORAL_TABLET | Freq: Four times a day (QID) | ORAL | Status: DC

## 2015-12-15 NOTE — Progress Notes (Signed)
Patient ID: ILYAAS MUSTO, male   DOB: Oct 01, 1981, 33 y.o.   MRN: [id number] Patient ID: JASIM HARARI, male   DOB: Oct 28, 1981, 34 y.o.   MRN: [id number] Patient ID: DWIGHT ADAMCZAK, male   DOB: [birth year], 34 y.o.   MRN: [id number] Patient ID: DAMASO LADAY, male   DOB: 03-Jul-1981, 34 y.o.   MRN: [id number] Patient ID: RICE WALSH, male   DOB: 20-Mar-1981, 34 y.o.   MRN: [id number] Patient ID: SEDALE JENIFER, male   DOB: Apr 14, 1981, 34 y.o.   MRN: [id number] Patient ID: KAIMANA LURZ, male   DOB: [birth year], 34 y.o.   MRN: [id number] Patient ID: ALON MAZOR, male   DOB: Nov 10, 1981, 34 y.o.   MRN: [id number] Patient ID: DEVIAN BARTOLOMEI, male   DOB: Jun 04, 1981, 34 y.o.   MRN: [id number] Patient ID: DEAKON FRIX, male   DOB: [birth year], 34 y.o.   MRN: [id number] Patient ID: NORFLEET CAPERS, male   DOB: 1981/09/26, 34 y.o.   MRN: [id number] [facility] [address] Progress Note MARCEL GARY MRN: [id number] DOB: July 21, 1981 Age: 34 y.o.  Date: 12/15/2015 Start Time: 8:35 AM End Time: 8:59 AM  Chief Complaint: Chief Complaint  Patient presents with  . Manic Behavior  . Depression  . Agitation  . Follow-up  Subjective: "I've been doing okay."  This patient is a 34 year old married white male who lives with his wife, a daughter 77 years old and a boy 8 years old in [place]. He is on disability.  The patient states that he has a history of both substance abuse and bipolar disorder. About 10 years ago he was heavily using crack cocaine and marijuana and alcohol. He developed severe mood issues and was diagnosed as bipolar. He was hospitalized twice in 2009 at [facility] for suicide attempts. He he still has significant problems with short-term memory and temper but he feels he is more stable and he's ever been. He thinks his current combination of medicines has been very helpful. His mood is stable and he is able to take care of his family while his wife works and he  is sleeping well.  The patient returns after 4 months. He is doing fairly well. His mood is been stable and he occasionally has temper flareups but not like he used to. He denies any side effects from medication. He still drinks some but not heavily. His last CBC and Tegretol levels were good. He denies any thoughts of suicide and his mood is been generally fairly upbeat BP 148/88 mmHg  Pulse 69  Ht 5' 7"  (1.702 m)  Wt 208 lb 6.4 oz (94.53 kg)  BMI 32.63 kg/m2  SpO2 99%  Allergies: Allergies  Allergen Reactions  . Penicillins Rash   Medical History: Past Medical History  Diagnosis Date  . HTN (hypertension)   . Back pain   . OSA (obstructive sleep apnea)   . Bipolar disorder [facility])    Surgical History: Past Surgical History  Procedure Laterality Date  . Rhinoplasty    . Tonsillectomy     Family History: family history includes ADD / ADHD in his daughter; Alcohol abuse in his father; Anxiety disorder in his father, mother, and sister; Bipolar disorder in his father and mother; Depression in his sister; Drug abuse in his father and mother; Paranoid behavior in his father and mother; Physical abuse in his father and mother. There is no history of Dementia, OCD, Schizophrenia, Seizures, or Sexual abuse. Reviewed  and nothing new today.  Current psychiatric medication Tegretol XR 212m 7 at HS Wellbutrin XL 1550m3 every AM Lovasa 1000 mg twice a day  Vitamin D 50,000 I.U. Every Monday AM OsCal twice a day. Xanax 2 mg up to 4 times daily [id number] dinner Past psychiatric history Patient has [facility] after abusing cocaine with suicidal plan to shoot himself.  Alcohol and substance use history Patient has history of using crack and cocaine in the past.   Mental status examination Patient is casually dressed and fairly groomed. He maintained fair eye contact. He is pleasant and cooperative and not irritable  He denies any auditory or  visual hallucination. He denies paranoia. He denies any thoughts of suicide or self-harm. He has no shaking or tremor He's alert and oriented x3. His insight judgment and impulse control is okay  Lab Results:  Results for orders placed or performed in visit on 08/03/15 (from the past 8736 hour(s))  Carbamazepine Level (Tegretol), total   Collection Time: 08/03/15  9:07 AM  Result Value Ref Range   Carbamazepine Lvl 9.1 4.0 - 12.0 ug/mL  CBC with Differential   Collection Time: 08/03/15  9:07 AM  Result Value Ref Range   WBC 6.8 4.0 - 10.5 K/uL   RBC 4.96 4.22 - 5.81 MIL/uL   Hemoglobin 15.6 13.0 - 17.0 g/dL   HCT 45.6 39.0 - 52.0 %   MCV 91.9 78.0 - 100.0 fL   MCH 31.5 26.0 - 34.0 pg   MCHC 34.2 30.0 - 36.0 g/dL   RDW 13.6 11.5 - 15.5 %   Platelets 303 150 - 400 K/uL   MPV 9.3 8.6 - 12.4 fL   Neutrophils Relative % 65 43 - 77 %   Neutro Abs 4.4 1.7 - 7.7 K/uL   Lymphocytes Relative 26 12 - 46 %   Lymphs Abs 1.8 0.7 - 4.0 K/uL   Monocytes Relative 6 3 - 12 %   Monocytes Absolute 0.4 0.1 - 1.0 K/uL   Eosinophils Relative 2 0 - 5 %   Eosinophils Absolute 0.1 0.0 - 0.7 K/uL   Basophils Relative 1 0 - 1 %   Basophils Absolute 0.1 0.0 - 0.1 K/uL   Smear Review Criteria for review not met    Assessment Axis I Bipolar disorder with psychotic features, polysubstance dependence in partial remission,  Axis II deferred Axis III see medical history Axis IV moderate  Plan/Discussion: I took his vitals.  I reviewed CC, tobacco/med/surg Hx, meds effects/ side effects, problem list, therapies and responses as well as current situation/symptoms discussed options. He'll continue Wellbutrin for depression, carbamazepine and Latuda for mood stabilization and Xanax for anxiety.  He'll return in 4 months See orders and pt instructions for more details.  Meds ordered this encounter  Medications  . buPROPion (WELLBUTRIN SR) 150 MG 12 hr tablet    Sig: Take 1 tablet (150 mg total) by mouth 3  (three) times daily.    Dispense:  90 tablet    Refill:  3  . carbamazepine (TEGRETOL XR) 200 MG 12 hr tablet    Sig: Take 7 tablets at bedtime    Dispense:  210 tablet    Refill:  3  . Lurasidone HCl (LATUDA) 120 MG TABS    Sig: Take 1 tablet (120 mg total) by mouth daily with supper.    Dispense:  30 tablet    Refill:  3  . alprazolam (XANAX) 2 MG  tablet    Sig: Take 1 tablet (2 mg total) by mouth 4 (four) times daily.    Dispense:  120 tablet    Refill:  3    Medical Decision Making Problem Points:  Established problem, stable/improving (1), Established problem, worsening (2), Review of last therapy session (1) and Review of psycho-social stressors (1) Data Points:  Review or order clinical lab tests (1) Review of medication regiment & side effects (2) Review of new medications or change in dosage (2)  I certify that outpatient services furnished can reasonably be expected to improve the patient's condition.   Levonne Spiller, MD

## 2016-04-12 ENCOUNTER — Encounter (HOSPITAL_COMMUNITY): Payer: Self-pay | Admitting: Psychiatry

## 2016-04-12 ENCOUNTER — Ambulatory Visit (INDEPENDENT_AMBULATORY_CARE_PROVIDER_SITE_OTHER): Payer: Medicare Other | Admitting: Psychiatry

## 2016-04-12 VITALS — BP 159/99 | HR 68 | Ht 67.0 in | Wt 197.6 lb

## 2016-04-12 DIAGNOSIS — F319 Bipolar disorder, unspecified: Secondary | ICD-10-CM

## 2016-04-12 MED ORDER — CARBAMAZEPINE ER 200 MG PO TB12: ORAL_TABLET | ORAL | Status: DC

## 2016-04-12 MED ORDER — QUETIAPINE FUMARATE 200 MG PO TABS: 400.0000 mg | ORAL_TABLET | Freq: Every day | ORAL | Status: DC

## 2016-04-12 MED ORDER — BUPROPION HCL ER (SR) 150 MG PO TB12: 150.0000 mg | ORAL_TABLET | Freq: Three times a day (TID) | ORAL | Status: DC

## 2016-04-12 MED ORDER — BUSPIRONE HCL 15 MG PO TABS: 15.0000 mg | ORAL_TABLET | Freq: Three times a day (TID) | ORAL | Status: DC

## 2016-04-12 MED ORDER — ALPRAZOLAM 2 MG PO TABS: 2.0000 mg | ORAL_TABLET | Freq: Three times a day (TID) | ORAL | Status: DC

## 2016-04-12 NOTE — Progress Notes (Signed)
Edward Lutz, male   DOB: 24-Jan-1981, 35 y.o.   MRN: [id number] Edward ID: Edward Lutz, male   DOB: 11-24-1981, 35 y.o.   MRN: [id number] Edward ID: Edward Lutz, male   DOB: [birth year], 35 y.o.   MRN: [id number] Edward ID: Edward Lutz, male   DOB: [birth year], 35 y.o.   MRN: [id number] Edward ID: Edward Lutz, male   DOB: Oct 04, 1981, 35 y.o.   MRN: [id number] Edward ID: Edward Lutz, male   DOB: Jan 20, 1981, 35 y.o.   MRN: [id number] Edward ID: Edward Lutz, male   DOB: 1980/12/22, 35 y.o.   MRN: [id number] Edward ID: Edward Lutz, male   DOB: 1981/10/16, 35 y.o.   MRN: [id number] Edward ID: Edward Lutz, male   DOB: [birth year], 35 y.o.   MRN: [id number] Edward ID: Edward Lutz, male   DOB: 22-Feb-1981, 35 y.o.   MRN: [id number] Edward ID: Edward Lutz, male   DOB: May 09, 1981, 35 y.o.   MRN: [id number] Edward ID: Edward Lutz, male   DOB: [birth year], 35 y.o.   MRN: [id number] [facility] [address] Progress Note Edward Lutz MRN: [id number] DOB: 1981-06-07 Age: 35 y.o.  Date: 04/12/2016 Start Time: 8:35 AM End Time: 8:59 AM  Chief Complaint: Chief Complaint  Edward presents with  . Depression  . Manic Behavior  . Anxiety  . Follow-up  Subjective: "I've been Paranoid  This Edward is a 35 year old married white male who lives with his wife, a daughter 13 years old and a boy 9 years old in [place]. He is on disability.  The Edward states that he has a history of both substance abuse and bipolar disorder. About 10 years ago he was heavily using crack cocaine and marijuana and alcohol. He developed severe mood issues and was diagnosed as bipolar. He was hospitalized twice in 2009 at [facility] for suicide attempts. He he still has significant problems with short-term memory and temper but he feels he is more stable and he's ever been. He thinks his current combination of medicines has been very helpful. His mood is stable  and he is able to take care of his family while his wife works and he is sleeping well.  The Edward returns after 4 months. He states that he is not doing well. His mother moved to [place] and his sister got locked up for breaking and entering and this is been very upsetting to him. He still has custody of her son. He states that he's been more depressed unable to sleep and constantly obsessing about paranoid ideation. For example he is convinced that his wife is having an affair even though he knows it's not really true. Whenever she leaves to go to work he is worried that she's not really where she says she has even though he knows she doesn't lie to him. He can't stop thinking about it and then he gets angry. His temper is getting out of control again. His pain management physician wants him to cut down on Xanax to 6 mg daily. I told him we would need to do this because of new FDA guidelines. I'm willing to add BuSpar to help with his anxiety. We discussed returning to Seroquel rather than Latuda since it would be more helpful for sedation at night. BP 159/99 mmHg  Pulse 68  Ht 5' 7"  (1.702 m)  Wt 197 lb 9.6 oz (89.631 kg)  BMI 30.94 kg/m2  SpO2 97%  Allergies: Allergies  Allergen Reactions  . Penicillins Rash   Medical History: Past Medical History  Diagnosis Date  . HTN (hypertension)   . Back pain   . OSA (obstructive sleep apnea)   . Bipolar disorder [facility])    Surgical History: Past Surgical History  Procedure Laterality Date  . Rhinoplasty    . Tonsillectomy     Family History: family history includes ADD / ADHD in his daughter; Alcohol abuse in his father; Anxiety disorder in his father, mother, and sister; Bipolar disorder in his father and mother; Depression in his sister; Drug abuse in his father and mother; Paranoid behavior in his father and mother; Physical abuse in his father and mother. There is no history of Dementia, OCD, Schizophrenia, Seizures, or Sexual  abuse. Reviewed and nothing new today.   Past psychiatric history Edward has admitted at behavioral [facility] in 2006 after abusing cocaine with suicidal plan to shoot himself.  Alcohol and substance use history Edward has history of using crack and cocaine in the past.   Mental status examination Edward is casually dressed and fairly groomed. He maintained fair eye contact. He is Obviously upset and tearful and  in distress. His mood is depressed and his affect is blunted and anxious He denies any auditory or visual hallucination. He states that he has been paranoid quite a bit lately He denies any thoughts of suicide or self-harm. He has no shaking or tremor He's alert and oriented x3. His insight judgment and impulse control is okay. Times he feels like he could jump on another person but he claims he would never do this  Lab Results:  Results for orders placed or performed in visit on 08/03/15 (from the past 8736 hour(s))  Carbamazepine Level (Tegretol), total   Collection Time: 08/03/15  9:07 AM  Result Value Ref Range   Carbamazepine Lvl 9.1 4.0 - 12.0 ug/mL  CBC with Differential   Collection Time: 08/03/15  9:07 AM  Result Value Ref Range   WBC 6.8 4.0 - 10.5 K/uL   RBC 4.96 4.22 - 5.81 MIL/uL   Hemoglobin 15.6 13.0 - 17.0 g/dL   HCT 45.6 39.0 - 52.0 %   MCV 91.9 78.0 - 100.0 fL   MCH 31.5 26.0 - 34.0 pg   MCHC 34.2 30.0 - 36.0 g/dL   RDW 13.6 11.5 - 15.5 %   Platelets 303 150 - 400 K/uL   MPV 9.3 8.6 - 12.4 fL   Neutrophils Relative % 65 43 - 77 %   Neutro Abs 4.4 1.7 - 7.7 K/uL   Lymphocytes Relative 26 12 - 46 %   Lymphs Abs 1.8 0.7 - 4.0 K/uL   Monocytes Relative 6 3 - 12 %   Monocytes Absolute 0.4 0.1 - 1.0 K/uL   Eosinophils Relative 2 0 - 5 %   Eosinophils Absolute 0.1 0.0 - 0.7 K/uL   Basophils Relative 1 0 - 1 %   Basophils Absolute 0.1 0.0 - 0.1 K/uL   Smear Review Criteria for review not met    Assessment Axis I Bipolar disorder with psychotic  features, polysubstance dependence in partial remission,  Axis II deferred Axis III see medical history Axis IV moderate  Plan/Discussion: I took his vitals.  I reviewed CC, tobacco/med/surg Hx, meds effects/ side effects, problem list, therapies and responses as well as current situation/symptoms discussed options. He'll continue Wellbutrin for depression, carbamazepine  mood stabilization and Xanax for anxiety.Xanax will be decreased to 60 g daily.  I will add BuSpar 15 mg 3 times a day for anxiety. He will taper off Latuda and start Seroquel 400 mg at bedtime  He'll return in 4 weeks See orders and pt instructions for more details.  Meds ordered this encounter  Medications  . DISCONTD: QUEtiapine (SEROQUEL) 200 MG tablet    Sig: Take 2 tablets (400 mg total) by mouth at bedtime.    Dispense:  60 tablet    Refill:  2  . carbamazepine (TEGRETOL XR) 200 MG 12 hr tablet    Sig: Take 7 tablets at bedtime    Dispense:  210 tablet    Refill:  3  . DISCONTD: buPROPion (WELLBUTRIN SR) 150 MG 12 hr tablet    Sig: Take 1 tablet (150 mg total) by mouth 3 (three) times daily.    Dispense:  90 tablet    Refill:  3  . alprazolam (XANAX) 2 MG tablet    Sig: Take 1 tablet (2 mg total) by mouth 3 (three) times daily.    Dispense:  90 tablet    Refill:  3  . carbamazepine (TEGRETOL XR) 200 MG 12 hr tablet    Sig: Take 7 tablets at bedtime    Dispense:  210 tablet    Refill:  3  . QUEtiapine (SEROQUEL) 200 MG tablet    Sig: Take 2 tablets (400 mg total) by mouth at bedtime.    Dispense:  60 tablet    Refill:  2  . DISCONTD: busPIRone (BUSPAR) 15 MG tablet    Sig: Take 1 tablet (15 mg total) by mouth 3 (three) times daily.    Dispense:  90 tablet    Refill:  2  . buPROPion (WELLBUTRIN SR) 150 MG 12 hr tablet    Sig: Take 1 tablet (150 mg total) by mouth 3 (three) times daily.    Dispense:  90 tablet    Refill:  3  . busPIRone (BUSPAR) 15 MG tablet    Sig: Take 1 tablet (15 mg total) by  mouth 3 (three) times daily.    Dispense:  90 tablet    Refill:  2    Medical Decision Making Problem Points:  Established problem, stable/improving (1), Established problem, worsening (2), Review of last therapy session (1) and Review of psycho-social stressors (1) Data Points:  Review or order clinical lab tests (1) Review of medication regiment & side effects (2) Review of new medications or change in dosage (2)  I certify that outpatient services furnished can reasonably be expected to improve the Edward's condition.   Levonne Spiller, MD

## 2016-05-02 ENCOUNTER — Ambulatory Visit (HOSPITAL_COMMUNITY): Payer: Self-pay | Admitting: Psychiatry

## 2016-05-14 ENCOUNTER — Encounter (HOSPITAL_COMMUNITY): Payer: Self-pay | Admitting: Psychiatry

## 2016-05-14 ENCOUNTER — Ambulatory Visit (INDEPENDENT_AMBULATORY_CARE_PROVIDER_SITE_OTHER): Payer: Medicare Other | Admitting: Psychiatry

## 2016-05-14 VITALS — BP 161/102 | HR 71 | Ht 67.0 in | Wt 200.4 lb

## 2016-05-14 DIAGNOSIS — F1921 Other psychoactive substance dependence, in remission: Secondary | ICD-10-CM

## 2016-05-14 DIAGNOSIS — F319 Bipolar disorder, unspecified: Secondary | ICD-10-CM

## 2016-05-14 MED ORDER — QUETIAPINE FUMARATE 400 MG PO TABS: ORAL_TABLET | ORAL | Status: DC

## 2016-05-14 NOTE — Progress Notes (Signed)
Patient ID: Edward Lutz, male   DOB: Jun 16, 1981, 35 y.o.   MRN: [id number] Patient ID: Edward Lutz, male   DOB: 08/14/1981, 35 y.o.   MRN: [id number] Patient ID: Edward Lutz, male   DOB: [birth year], 35 y.o.   MRN: [id number] Patient ID: Edward Lutz, male   DOB: Jan 24, 1981, 35 y.o.   MRN: [id number] Patient ID: Edward Lutz, male   DOB: 11-01-1981, 35 y.o.   MRN: [id number] Patient ID: Edward Lutz, male   DOB: 1981/05/16, 35 y.o.   MRN: [id number] Patient ID: Edward Lutz, male   DOB: [birth year], 35 y.o.   MRN: [id number] Patient ID: Edward Lutz, male   DOB: Apr 23, 1981, 35 y.o.   MRN: [id number] Patient ID: Edward Lutz, male   DOB: 1981/01/04, 35 y.o.   MRN: [id number] Patient ID: Edward Lutz, male   DOB: August 22, 1981, 35 y.o.   MRN: [id number] Patient ID: Edward Lutz, male   DOB: Mar 23, 1981, 35 y.o.   MRN: [id number] Patient ID: Edward Lutz, male   DOB: [birth year], 35 y.o.   MRN: [id number] Patient ID: Edward Lutz, male   DOB: 1981-11-24, 35 y.o.   MRN: [id number] [facility] [address] Progress Note Edward Lutz MRN: [id number] DOB: Jun 11, 1981 Age: 35 y.o.  Date: 05/14/2016 Start Time: 8:35 AM End Time: 8:59 AM  Chief Complaint: Chief Complaint  Patient presents with  . Depression  . Manic Behavior  . Anxiety  . Follow-up  Subjective: "I'm still angry"  This patient is a 35 year old married white male who lives with his wife, a daughter 16 years old and a boy 54 years old in [place]. He is on disability.  The patient states that he has a history of both substance abuse and bipolar disorder. About 10 years ago he was heavily using crack cocaine and marijuana and alcohol. He developed severe mood issues and was diagnosed as bipolar. He was hospitalized twice in 2009 at [facility] for suicide attempts. He he still has significant problems with short-term memory and temper but he feels he is more stable and he's ever been. He thinks his  current combination of medicines has been very helpful. His mood is stable and he is able to take care of his family while his wife works and he is sleeping well.  The patient returns after 4 weeks. Last time he states he was increasingly paranoid about his wife. She is with him today. She's been working at a nursing home for about 6 months. He has a hard time with her working in [employer] sector. He is constantly afraid she is going to meet someone else. She swears that this is not her intention particular since she mostly works with other women and around elderly people. He states that he understands this intellectually but he is constantly worried. He is also worried about losing his disability since its being reevaluated. I did increase his Seroquel which is helped a little bit but I told him we can go up his highest 600 or 800 mg and we will try this next BP 161/102 mmHg  Pulse 71  Ht 5' 7"  (1.702 m)  Wt 200 lb 6.4 oz (90.901 kg)  BMI 31.38 kg/m2  SpO2 99%  Allergies: Allergies  Allergen Reactions  . Penicillins Rash   Medical History: Past Medical History  Diagnosis Date  . HTN (hypertension)   . Back pain   . OSA (obstructive sleep apnea)   .  Bipolar disorder [facility])    Surgical History: Past Surgical History  Procedure Laterality Date  . Rhinoplasty    . Tonsillectomy     Family History: family history includes ADD / ADHD in his daughter; Alcohol abuse in his father; Anxiety disorder in his father, mother, and sister; Bipolar disorder in his father and mother; Depression in his sister; Drug abuse in his father and mother; Paranoid behavior in his father and mother; Physical abuse in his father and mother. There is no history of Dementia, OCD, Schizophrenia, Seizures, or Sexual abuse. Reviewed and nothing new today.   Past psychiatric history Patient has admitted at behavioral [facility] in 2006 after abusing cocaine with suicidal plan to shoot himself.  Alcohol and  substance use history Patient has history of using crack and cocaine in the past.   Mental status examination Patient is casually dressed and fairly groomed. He maintained fair eye contact. He is Obviously upset and tearful and  in distress. His mood is depressed and his affect is blunted and anxious He denies any auditory or visual hallucination. He states that he has been paranoid quite a bit lately.seems angry when I challenged him about his belief system and told him he needs to go to counseling He denies any thoughts of suicide or self-harm. He has no shaking or tremor He's alert and oriented x3. His insight judgment and impulse control is okay. Times he feels like he could jump on another person but he claims he would never do this  Lab Results:  Results for orders placed or performed in visit on 08/03/15 (from the past 8736 hour(s))  Carbamazepine Level (Tegretol), total   Collection Time: 08/03/15  9:07 AM  Result Value Ref Range   Carbamazepine Lvl 9.1 4.0 - 12.0 ug/mL  CBC with Differential   Collection Time: 08/03/15  9:07 AM  Result Value Ref Range   WBC 6.8 4.0 - 10.5 K/uL   RBC 4.96 4.22 - 5.81 MIL/uL   Hemoglobin 15.6 13.0 - 17.0 g/dL   HCT 45.6 39.0 - 52.0 %   MCV 91.9 78.0 - 100.0 fL   MCH 31.5 26.0 - 34.0 pg   MCHC 34.2 30.0 - 36.0 g/dL   RDW 13.6 11.5 - 15.5 %   Platelets 303 150 - 400 K/uL   MPV 9.3 8.6 - 12.4 fL   Neutrophils Relative % 65 43 - 77 %   Neutro Abs 4.4 1.7 - 7.7 K/uL   Lymphocytes Relative 26 12 - 46 %   Lymphs Abs 1.8 0.7 - 4.0 K/uL   Monocytes Relative 6 3 - 12 %   Monocytes Absolute 0.4 0.1 - 1.0 K/uL   Eosinophils Relative 2 0 - 5 %   Eosinophils Absolute 0.1 0.0 - 0.7 K/uL   Basophils Relative 1 0 - 1 %   Basophils Absolute 0.1 0.0 - 0.1 K/uL   Smear Review Criteria for review not met    Assessment Axis I Bipolar disorder with psychotic features, polysubstance dependence in partial remission,  Axis II deferred Axis III see medical  history Axis IV moderate  Plan/Discussion: I took his vitals.  I reviewed CC, tobacco/med/surg Hx, meds effects/ side effects, problem list, therapies and responses as well as current situation/symptoms discussed options. He'll continue Wellbutrin for depression, carbamazepine  mood stabilization and Xanax for anxiety.Xanax will be 6 mg daily. He will continue BuSpar 15 mg 3 times a day for anxiety. He will increase Seroquel 600 mg or go to mg  at bedtime if needed  He'll return in 4 weeks. He was strongly urged to go to counseling but he adamantly refused See orders and pt instructions for more details.  Meds ordered this encounter  Medications  . QUEtiapine (SEROQUEL) 400 MG tablet    Sig: Take two at bedtime    Dispense:  60 tablet    Refill:  2    Medical Decision Making Problem Points:  Established problem, stable/improving (1), Established problem, worsening (2), Review of last therapy session (1) and Review of psycho-social stressors (1) Data Points:  Review or order clinical lab tests (1) Review of medication regiment & side effects (2) Review of new medications or change in dosage (2)  I certify that outpatient services furnished can reasonably be expected to improve the patient's condition.   Levonne Spiller, MD

## 2016-06-06 ENCOUNTER — Ambulatory Visit (INDEPENDENT_AMBULATORY_CARE_PROVIDER_SITE_OTHER): Payer: Medicare Other | Admitting: Psychiatry

## 2016-06-06 ENCOUNTER — Encounter (HOSPITAL_COMMUNITY): Payer: Self-pay | Admitting: Psychiatry

## 2016-06-06 VITALS — BP 166/99 | HR 74 | Ht 67.0 in | Wt 204.0 lb

## 2016-06-06 DIAGNOSIS — F319 Bipolar disorder, unspecified: Secondary | ICD-10-CM | POA: Diagnosis not present

## 2016-06-06 DIAGNOSIS — F1921 Other psychoactive substance dependence, in remission: Secondary | ICD-10-CM

## 2016-06-06 MED ORDER — ALPRAZOLAM 2 MG PO TABS: 2.0000 mg | ORAL_TABLET | Freq: Three times a day (TID) | ORAL | Status: DC

## 2016-06-06 MED ORDER — BUPROPION HCL ER (SR) 150 MG PO TB12: 150.0000 mg | ORAL_TABLET | Freq: Three times a day (TID) | ORAL | Status: DC

## 2016-06-06 MED ORDER — QUETIAPINE FUMARATE 400 MG PO TABS: ORAL_TABLET | ORAL | Status: DC

## 2016-06-06 MED ORDER — BUSPIRONE HCL 30 MG PO TABS: 30.0000 mg | ORAL_TABLET | Freq: Three times a day (TID) | ORAL | Status: DC

## 2016-06-06 NOTE — Progress Notes (Signed)
Patient ID: TASHA JINDRA, male   DOB: October 26, 1981, 35 y.o.   MRN: [id number] Patient ID: REUBEN KNOBLOCK, male   DOB: [birth year], 35 y.o.   MRN: [id number] Patient ID: TENNESSEE PERRA, male   DOB: [birth year], 35 y.o.   MRN: [id number] Patient ID: TYRIQUE SPORN, male   DOB: September 03, 1981, 35 y.o.   MRN: [id number] Patient ID: RONIT MARCZAK, male   DOB: 1981/05/07, 35 y.o.   MRN: [id number] Patient ID: KEYONTAE HUCKEBY, male   DOB: [birth year], 35 y.o.   MRN: [id number] Patient ID: ACHILLIES BUEHL, male   DOB: January 17, 1981, 35 y.o.   MRN: [id number] Patient ID: KENSHIN SPLAWN, male   DOB: 1981-05-25, 35 y.o.   MRN: [id number] Patient ID: HILERY WINTLE, male   DOB: [birth year], 35 y.o.   MRN: [id number] Patient ID: ZAEL SHUMAN, male   DOB: 1981-03-05, 35 y.o.   MRN: [id number] Patient ID: ARLING CERONE, male   DOB: Sep 29, 1981, 35 y.o.   MRN: [id number] Patient ID: DERRILL BAGNELL, male   DOB: Aug 03, 1981, 35 y.o.   MRN: [id number] Patient ID: JAIME GRIZZELL, male   DOB: 1981/07/16, 35 y.o.   MRN: [id number] Patient ID: TYRUS WILMS, male   DOB: 05/15/1981, 35 y.o.   MRN: [id number] [facility] [address] Progress Note LIMUEL NIEBLAS MRN: [id number] DOB: 25-Jan-1981 Age: 35 y.o.  Date: 06/06/2016 Start Time: 8:35 AM End Time: 8:59 AM  Chief Complaint: Chief Complaint  Patient presents with  . Depression  . Manic Behavior  . Anxiety  . Follow-up  Subjective: "I'm A little better"  This patient is a 35 year old married white male who lives with his wife, a daughter 76 years old and a boy 21 years old in [place]. He is on disability.  The patient states that he has a history of both substance abuse and bipolar disorder. About 10 years ago he was heavily using crack cocaine and marijuana and alcohol. He developed severe mood issues and was diagnosed as bipolar. He was hospitalized twice in 2009 at [facility] for suicide attempts. He he still has significant problems with short-term  memory and temper but he feels he is more stable and he's ever been. He thinks his current combination of medicines has been very helpful. His mood is stable and he is able to take care of his family while his wife works and he is sleeping well.  The patient returns after 4 weeks. Last time he was still agitated and paranoid about his wife going to work. He kept thinking she was going to meet someone else. I increased his Seroquel to 800 mg at bedtime and he is a little bit better. He does think the buspirone has helped and we can go ahead and increase this today. His mood is still up and down and he doesn't sleep well and keeps waking up. His wife reports that he has good days and he is very happy and then he'll crash and get angry and irritable. Sometimes this happens very quickly. The patient states that he did the best in terms of mood on Depakote but he doesn't want to go back to it because he gained a good deal of weight on it. He states he would rather stay on what he is taking now" deal with it." BP 166/99 mmHg  Pulse 74  Ht 5' 7"  (1.702 m)  Wt 204 lb (92.534 kg)  BMI 31.94 kg/m2  SpO2 98%  Allergies: Allergies  Allergen Reactions  . Penicillins Rash   Medical History: Past Medical History  Diagnosis Date  . HTN (hypertension)   . Back pain   . OSA (obstructive sleep apnea)   . Bipolar disorder [facility])    Surgical History: Past Surgical History  Procedure Laterality Date  . Rhinoplasty    . Tonsillectomy     Family History: family history includes ADD / ADHD in his daughter; Alcohol abuse in his father; Anxiety disorder in his father, mother, and sister; Bipolar disorder in his father and mother; Depression in his sister; Drug abuse in his father and mother; Paranoid behavior in his father and mother; Physical abuse in his father and mother. There is no history of Dementia, OCD, Schizophrenia, Seizures, or Sexual abuse. Reviewed and nothing new today.   Past psychiatric  history Patient has admitted at behavioral [facility] in 2006 after abusing cocaine with suicidal plan to shoot himself.  Alcohol and substance use history Patient has history of using crack and cocaine in the past.   Mental status examination Patient is casually dressed and fairly groomed. He maintained fair eye contact. He is Still anxious but a little bit brighter His mood is somewhat dysphoric and his affect is brighter and he is able to smile at times He denies any auditory or visual hallucination. He states that he has been a little bit but not as much as before and he is trying to work this out with his wife He denies any thoughts of suicide or self-harm. He has no shaking or tremor He's alert and oriented x3. His insight judgment and impulse control is okay. Times he feels like he could jump on another person but he claims he would never do this  Lab Results:  Results for orders placed or performed in visit on 08/03/15 (from the past 8736 hour(s))  Carbamazepine Level (Tegretol), total   Collection Time: 08/03/15  9:07 AM  Result Value Ref Range   Carbamazepine Lvl 9.1 4.0 - 12.0 ug/mL  CBC with Differential   Collection Time: 08/03/15  9:07 AM  Result Value Ref Range   WBC 6.8 4.0 - 10.5 K/uL   RBC 4.96 4.22 - 5.81 MIL/uL   Hemoglobin 15.6 13.0 - 17.0 g/dL   HCT 45.6 39.0 - 52.0 %   MCV 91.9 78.0 - 100.0 fL   MCH 31.5 26.0 - 34.0 pg   MCHC 34.2 30.0 - 36.0 g/dL   RDW 13.6 11.5 - 15.5 %   Platelets 303 150 - 400 K/uL   MPV 9.3 8.6 - 12.4 fL   Neutrophils Relative % 65 43 - 77 %   Neutro Abs 4.4 1.7 - 7.7 K/uL   Lymphocytes Relative 26 12 - 46 %   Lymphs Abs 1.8 0.7 - 4.0 K/uL   Monocytes Relative 6 3 - 12 %   Monocytes Absolute 0.4 0.1 - 1.0 K/uL   Eosinophils Relative 2 0 - 5 %   Eosinophils Absolute 0.1 0.0 - 0.7 K/uL   Basophils Relative 1 0 - 1 %   Basophils Absolute 0.1 0.0 - 0.1 K/uL   Smear Review Criteria for review not met    Assessment Axis I Bipolar  disorder with psychotic features, polysubstance dependence in partial remission,  Axis II deferred Axis III see medical history Axis IV moderate  Plan/Discussion: I took his vitals.  I reviewed CC, tobacco/med/surg Hx, meds effects/ side effects, problem list, therapies and responses as well as current situation/symptoms discussed  options. He'll continue Wellbutrin for depression, carbamazepine  mood stabilization and Xanax for anxiety.Xanax will be 6 mg daily. He will continue BuSpar And increase to 30 mg mg 3 times a day for anxiety. He will into new Seroquel 800 mg or go to mg at bedtime if needed  He'll return in 2 months. He was strongly urged to go to counseling but he adamantly refused See orders and pt instructions for more details.  Meds ordered this encounter  Medications  . DISCONTD: buPROPion (WELLBUTRIN SR) 150 MG 12 hr tablet    Sig: Take 1 tablet (150 mg total) by mouth 3 (three) times daily.    Dispense:  90 tablet    Refill:  3  . QUEtiapine (SEROQUEL) 400 MG tablet    Sig: Take two at bedtime    Dispense:  60 tablet    Refill:  2  . busPIRone (BUSPAR) 30 MG tablet    Sig: Take 1 tablet (30 mg total) by mouth 3 (three) times daily.    Dispense:  90 tablet    Refill:  2  . buPROPion (WELLBUTRIN SR) 150 MG 12 hr tablet    Sig: Take 1 tablet (150 mg total) by mouth 3 (three) times daily.    Dispense:  90 tablet    Refill:  3  . alprazolam (XANAX) 2 MG tablet    Sig: Take 1 tablet (2 mg total) by mouth 3 (three) times daily.    Dispense:  90 tablet    Refill:  3    Medical Decision Making Problem Points:  Established problem, stable/improving (1), Established problem, worsening (2), Review of last therapy session (1) and Review of psycho-social stressors (1) Data Points:  Review or order clinical lab tests (1) Review of medication regiment & side effects (2) Review of new medications or change in dosage (2)  I certify that outpatient services furnished can  reasonably be expected to improve the patient's condition.   Levonne Spiller, MD

## 2016-06-10 ENCOUNTER — Ambulatory Visit (HOSPITAL_COMMUNITY): Payer: Self-pay | Admitting: Psychiatry

## 2016-07-11 ENCOUNTER — Other Ambulatory Visit (HOSPITAL_COMMUNITY): Payer: Self-pay | Admitting: Psychiatry

## 2016-07-31 ENCOUNTER — Ambulatory Visit (HOSPITAL_COMMUNITY): Payer: Self-pay | Admitting: Psychiatry

## 2016-08-11 ENCOUNTER — Other Ambulatory Visit (HOSPITAL_COMMUNITY): Payer: Self-pay | Admitting: Psychiatry

## 2016-08-12 ENCOUNTER — Telehealth (HOSPITAL_COMMUNITY): Payer: Self-pay | Admitting: *Deleted

## 2016-08-12 NOTE — Telephone Encounter (Signed)
Pt came into office to pick up his printed script for his Xanax. Per pt chart, medication was printed on 08-12-16 for 90 tablets 0 refills. Pt Xanax was called into pharmacy instead and spoke with Harrold DonathNathan and he verbalized understanding.

## 2016-08-13 ENCOUNTER — Telehealth (HOSPITAL_COMMUNITY): Payer: Self-pay | Admitting: *Deleted

## 2016-08-13 NOTE — Telephone Encounter (Signed)
patient called and said he did not get his Tegretol on yesterday.

## 2016-08-13 NOTE — Telephone Encounter (Signed)
Called pt back due to previous call. Informed pt that what staff was calling in his Xanax yesterday, staff verified with pharmacy staff if they received pt Tegretol and they verbalized they did receive it via e-scribe. Pt verbalized understanding and stated he will call pharmacy again because they did not give him the Tegretol yesterday. RMA informed pt when he call pharmacy to ask them to also look in his hold file and pt agreed and verbalized understanding.

## 2016-08-14 ENCOUNTER — Ambulatory Visit (HOSPITAL_COMMUNITY): Payer: Self-pay | Admitting: Psychiatry

## 2016-09-02 ENCOUNTER — Telehealth (HOSPITAL_COMMUNITY): Payer: Self-pay | Admitting: *Deleted

## 2016-09-02 NOTE — Telephone Encounter (Signed)
Called pt to resch appt for 09-06-2016 due to provider being out of office. Called pt number and lmtcb and informed him on voicemail that appt was changed to October 3rd and time was provided. Informed pt on voicemail message to please contact office if time was good and/or not. Office number was provided as well.

## 2016-09-06 ENCOUNTER — Ambulatory Visit (HOSPITAL_COMMUNITY): Payer: Self-pay | Admitting: Psychiatry

## 2016-09-10 ENCOUNTER — Telehealth (HOSPITAL_COMMUNITY): Payer: Self-pay | Admitting: *Deleted

## 2016-09-10 ENCOUNTER — Other Ambulatory Visit (HOSPITAL_COMMUNITY): Payer: Self-pay | Admitting: Psychiatry

## 2016-09-10 NOTE — Telephone Encounter (Signed)
Spoke with pt to inform him that his printed script for his Xanax is ready for pick up. Per pt if Dr. Tenny Crawoss could call in his Xanax instead and per Dr. Tenny Crawoss that was fine. Called pt pharmacy and spoke with Duwayne Heckanielle and called in printed script for pt. Danielle verbalized understanding.

## 2016-09-17 ENCOUNTER — Ambulatory Visit (INDEPENDENT_AMBULATORY_CARE_PROVIDER_SITE_OTHER): Payer: Medicare Other | Admitting: Psychiatry

## 2016-09-17 ENCOUNTER — Encounter (HOSPITAL_COMMUNITY): Payer: Self-pay | Admitting: Psychiatry

## 2016-09-17 VITALS — BP 117/75 | HR 98 | Ht 67.0 in | Wt 209.6 lb

## 2016-09-17 DIAGNOSIS — F319 Bipolar disorder, unspecified: Secondary | ICD-10-CM | POA: Diagnosis not present

## 2016-09-17 MED ORDER — ALPRAZOLAM 2 MG PO TABS: 2.0000 mg | ORAL_TABLET | Freq: Three times a day (TID) | ORAL | 2 refills | Status: DC

## 2016-09-17 MED ORDER — ZOLPIDEM TARTRATE ER 12.5 MG PO TBCR: 12.5000 mg | EXTENDED_RELEASE_TABLET | Freq: Every evening | ORAL | 2 refills | Status: DC | PRN

## 2016-09-17 MED ORDER — CARBAMAZEPINE ER 200 MG PO TB12: ORAL_TABLET | ORAL | 0 refills | Status: DC

## 2016-09-17 MED ORDER — BUSPIRONE HCL 30 MG PO TABS: ORAL_TABLET | ORAL | 2 refills | Status: DC

## 2016-09-17 MED ORDER — LURASIDONE HCL 120 MG PO TABS: 120.0000 mg | ORAL_TABLET | Freq: Every day | ORAL | 2 refills | Status: DC

## 2016-09-17 MED ORDER — BUPROPION HCL ER (SR) 150 MG PO TB12: 150.0000 mg | ORAL_TABLET | Freq: Three times a day (TID) | ORAL | 3 refills | Status: DC

## 2016-09-17 NOTE — Progress Notes (Signed)
Patient ID: Edward Lutz, male   DOB: 01/15/1981, 35 y.o.   MRN: [id number] Patient ID: Edward Lutz, male   DOB: 01/15/1981, 35 y.o.   MRN: [id number] Patient ID: Edward Lutz, male   DOB: 01/15/1981, 35 y.o.   MRN: [id number] Patient ID: Edward Lutz, male   DOB: 01/15/1981, 35 y.o.   MRN: [id number] Patient ID: Edward Lutz, male   DOB: 01/15/1981, 35 y.o.   MRN: [id number] Patient ID: Edward Lutz, male   DOB: 01/15/1981, 35 y.o.   MRN: [id number] Patient ID: Edward Lutz, male   DOB: 01/15/1981, 35 y.o.   MRN: [id number] Patient ID: Edward Lutz, male   DOB: 01/15/1981, 35 y.o.   MRN: [id number] Patient ID: Edward Lutz, male   DOB: 01/15/1981, 35 y.o.   MRN: [id number] Patient ID: Edward JockBryant E Walla, male   DOB: 01/15/1981, 35 y.o.   MRN: [id number] Patient ID: Edward JockBryant E Hancock, male   DOB: 01/15/1981, 35 y.o.   MRN: [id number] Patient ID: Edward JockBryant E Mehta, male   DOB: 01/15/1981, 35 y.o.   MRN: [id number] Patient ID: Edward JockBryant E Beste, male   DOB: 01/15/1981, 35 y.o.   MRN: [id number] Patient ID: Edward JockBryant E Pagliarulo, male   DOB: 01/15/1981, 35 y.o.   MRN: [id number] [facility] [id number] Progress Note Edward JockBryant E Paule MRN: [id number] DOB: 01/15/1981 Age: 35 y.o.  Date: 09/17/2016 Start Time: 8:35 AM End Time: 8:59 AM  Chief Complaint: Chief Complaint  Patient presents with  . Depression  . Manic Behavior  . Anxiety  Subjective: "I'm A little better"  This patient is a 35 year old married white male who lives with his wife, a daughter 10055 years old and a boy 36 years old in [place]. He is on disability.  The patient states that he has a history of both substance abuse and bipolar disorder. About 10 years ago he was heavily using crack cocaine and marijuana and alcohol. He developed severe mood issues and was diagnosed as bipolar. He was hospitalized twice in 2009 at [facility] for suicide attempts. He he still has significant problems with short-term memory and  temper but he feels he is more stable and he's ever been. He thinks his current combination of medicines has been very helpful. His mood is stable and he is able to take care of his family while his wife works and he is sleeping well.  The patient returns after 3 months. He states that he is worried because he's gained about 7 pounds on the Seroquel. He states that one point he was over 300 pounds and he doesn't want to go back in this direction. He would like to go back on Latuda. I reminded him that at one point the JordanLatuda didn't work for him and he became more paranoid and angry but he still is insistent. He also states that he is waking up at night and eating and he needs something to help him sleep. He still worries a lot that his wife may have an affair even though she is sworn that she never would. He tries to keep this to himself. He denies any thoughts of harm to self or others BP 117/75 (BP Location: Right Arm, Patient Position: Sitting, Cuff Size: Large)   Pulse 98   Ht 5\' 7"  (1.702 m)   Wt 209 lb 9.6 oz (95.1 kg)   SpO2 97%   BMI 32.83 kg/m   Allergies: Allergies  Allergen Reactions  .  Penicillins Rash   Medical History: Past Medical History:  Diagnosis Date  . Back pain   . Bipolar disorder (HCC)   . HTN (hypertension)   . OSA (obstructive sleep apnea)    Surgical History: Past Surgical History:  Procedure Laterality Date  . RHINOPLASTY    . TONSILLECTOMY     Family History: family history includes ADD / ADHD in his daughter; Alcohol abuse in his father; Anxiety disorder in his father, mother, and sister; Bipolar disorder in his father and mother; Depression in his sister; Drug abuse in his father and mother; Paranoid behavior in his father and mother; Physical abuse in his father and mother. Reviewed and nothing new today.   [facility]using [facility] with suicidal plan to shoot  himself.  Alcohol and substance use history Patient has history of using crack and cocaine in the past.   Mental status examination Patient is casually dressed and fairly groomed. He maintained fair eye contact. He is Still anxious and a bit irritable today His mood is somewhat dysphoricHe denies any auditory or visual hallucination. He states that he has been a little bi paranoid t but not as much as before and he is trying to work this out with his wife He denies any thoughts of suicide or self-harm. He has no shaking or tremor He's alert and oriented x3. His insight judgment and impulse control is okay. Times he feels like he could jump on another person but he claims he would never do this  Lab Results:  No results found for this or any previous visit (from the past 8736 hour(s)). Assessment Axis I Bipolar disorder with psychotic features, polysubstance dependence in partial remission,  Axis II deferred Axis III see medical history Axis IV moderate  Plan/Discussion: I took his vitals.  I reviewed CC, tobacco/med/surg Hx, meds effects/ side effects, problem list, therapies and responses as well as current situation/symptoms discussed options. He'll continue Wellbutrin for depression, carbamazepine  mood stabilization and Xanax for anxiety.Xanax will be 6 mg daily. He will continue BuSpar 30 mg mg 3 times a day for anxiety. He will discontinue Seroquel and start Latuda 120 mg at dinner and will also start Ambien CR 12.5 mg at bedtime for sleep He'll return in 6 weeks or call if his mood symptoms worsen before that  See orders and pt instructions for more details.  Meds ordered this encounter  Medications  . Morphine-Naltrexone (EMBEDA) 30-1.2 MG CPCR    Sig: Take by mouth.  Marland Kitchen lisinopril-hydrochlorothiazide (PRINZIDE,ZESTORETIC) 20-12.5 MG tablet    Sig: Take by mouth every morning.  . busPIRone (BUSPAR) 30 MG tablet    Sig: TAKE (1) TABLET BY MOUTH (3) TIMES DAILY.    Dispense:  90  tablet    Refill:  2  . carbamazepine (TEGRETOL XR) 200 MG 12 hr tablet    Sig: TAKE 7 TABLETS BY MOUTH AT BEDTIME.    Dispense:  210 tablet    Refill:  0  . buPROPion (WELLBUTRIN SR) 150 MG 12 hr tablet    Sig: Take 1 tablet (150 mg total) by mouth 3 (three) times daily.    Dispense:  90 tablet    Refill:  3  . Lurasidone HCl 120 MG TABS    Sig: Take 1 tablet (120 mg total) by mouth daily with supper.    Dispense:  30 tablet    Refill:  2  . alprazolam Prudy Feeler)  2 MG tablet    Sig: Take 1 tablet (2 mg total) by mouth 3 (three) times daily.    Dispense:  90 tablet    Refill:  2  . zolpidem (AMBIEN CR) 12.5 MG CR tablet    Sig: Take 1 tablet (12.5 mg total) by mouth at bedtime as needed for sleep.    Dispense:  30 tablet    Refill:  2    Medical Decision Making Problem Points:  Established problem, stable/improving (1), Established problem, worsening (2), Review of last therapy session (1) and Review of psycho-social stressors (1) Data Points:  Review or order clinical lab tests (1) Review of medication regiment & side effects (2) Review of new medications or change in dosage (2)  I certify that outpatient services furnished can reasonably be expected to improve the patient's condition.   [name], MD

## 2016-09-20 ENCOUNTER — Telehealth (HOSPITAL_COMMUNITY): Payer: Self-pay | Admitting: *Deleted

## 2016-09-20 NOTE — Telephone Encounter (Signed)
I don't see any of these in his records

## 2016-09-20 NOTE — Telephone Encounter (Addendum)
Prior authorization for Zolpidem Tartrate ER received. Sent question to MD for clarification before completing authorization. 09/24/16-Completed online with cover my meds. Awaiting decision to be faxed.

## 2016-09-25 ENCOUNTER — Telehealth (HOSPITAL_COMMUNITY): Payer: Self-pay | Admitting: *Deleted

## 2016-09-25 NOTE — Telephone Encounter (Signed)
Office received denial for pt Zolpidem Tartrate ER from pt insurance. Denial stated pt had to try 2 other preferred medications before approval. [facility] process. Before denial, called pt and asked him if he tried any other medications before Zolpidem Tartrate ER and he stated no. Please advise.

## 2016-09-25 NOTE — Telephone Encounter (Signed)
Received denial of Zolpidem Tartrate ER stating that he needs to first try 2 of the following: Belsomra, Rozerem, Trazodone, Zaleplon, Zolpidem tablet. Otherwise there needs to be specific reasons why the alternatives are not appropriate to treat the medical condition.

## 2016-09-25 NOTE — Telephone Encounter (Signed)
We can try regular ambien 10 mg # 30, 2 refills, one at bedtime, please call in

## 2016-09-26 ENCOUNTER — Telehealth (HOSPITAL_COMMUNITY): Payer: Self-pay | Admitting: *Deleted

## 2016-09-26 MED ORDER — ZOLPIDEM TARTRATE 10 MG PO TABS: 10.0000 mg | ORAL_TABLET | Freq: Every evening | ORAL | 2 refills | Status: DC | PRN

## 2016-09-26 NOTE — Telephone Encounter (Signed)
Per Dr. Tenny Crawoss to call in Ambien 10 mg 30 tab 2 refills. Called pt pharmacy and spoke with Harrold DonathNathan the pharmacist and informed his with information. Called pt and informed him with medication changes and it was called in to his pharmacy. Pt verbalized understanding.

## 2016-09-26 NOTE — Telephone Encounter (Signed)
I have asked Octavia to call in regular Ambien, not CR

## 2016-09-26 NOTE — Telephone Encounter (Signed)
Medication called into pharmacy. Spoke with Harrold DonathNathan and pt is aware.

## 2016-09-26 NOTE — Telephone Encounter (Signed)
Medication called into pharmacy. Spoke with Edward Lutz

## 2016-10-07 ENCOUNTER — Telehealth (HOSPITAL_COMMUNITY): Payer: Self-pay | Admitting: *Deleted

## 2016-10-07 NOTE — Telephone Encounter (Signed)
Office received fax from [facility] for office to respond Urgently due to pt Latuda 120 mg. Per fax, they wanted to know if provider have treated pt. Also for provider to specify if refills were authorized for pt and for provider to sign form and fax back to [phone]. Contact person with [facility] is Debria GarretMelissa Herrera at [phone] and need a response within 24 hrs. Called pt to verify which pharmacy he's using and pt stated [facility] and not [facility] and to disregard the fax.

## 2016-10-29 ENCOUNTER — Ambulatory Visit (INDEPENDENT_AMBULATORY_CARE_PROVIDER_SITE_OTHER): Payer: Medicare Other | Admitting: Psychiatry

## 2016-10-29 ENCOUNTER — Encounter (HOSPITAL_COMMUNITY): Payer: Self-pay | Admitting: Psychiatry

## 2016-10-29 VITALS — BP 128/82 | HR 89 | Ht 67.0 in | Wt 215.8 lb

## 2016-10-29 DIAGNOSIS — F1921 Other psychoactive substance dependence, in remission: Secondary | ICD-10-CM | POA: Diagnosis not present

## 2016-10-29 DIAGNOSIS — Z880 Allergy status to penicillin: Secondary | ICD-10-CM

## 2016-10-29 DIAGNOSIS — F319 Bipolar disorder, unspecified: Secondary | ICD-10-CM | POA: Diagnosis not present

## 2016-10-29 DIAGNOSIS — Z79899 Other long term (current) drug therapy: Secondary | ICD-10-CM

## 2016-10-29 MED ORDER — CARBAMAZEPINE ER 200 MG PO TB12: ORAL_TABLET | ORAL | 0 refills | Status: DC

## 2016-10-29 MED ORDER — BUPROPION HCL ER (SR) 150 MG PO TB12: 150.0000 mg | ORAL_TABLET | Freq: Three times a day (TID) | ORAL | 3 refills | Status: DC

## 2016-10-29 MED ORDER — ZOLPIDEM TARTRATE ER 12.5 MG PO TBCR: 12.5000 mg | EXTENDED_RELEASE_TABLET | Freq: Every evening | ORAL | 2 refills | Status: DC | PRN

## 2016-10-29 MED ORDER — LURASIDONE HCL 120 MG PO TABS: 120.0000 mg | ORAL_TABLET | Freq: Every day | ORAL | 2 refills | Status: DC

## 2016-10-29 MED ORDER — BUSPIRONE HCL 30 MG PO TABS: ORAL_TABLET | ORAL | 2 refills | Status: DC

## 2016-10-29 MED ORDER — ALPRAZOLAM 2 MG PO TABS
2.0000 mg | ORAL_TABLET | Freq: Three times a day (TID) | ORAL | 2 refills | Status: DC
Start: 2016-10-29 — End: 2017-01-29
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
## 2016-11-06 ENCOUNTER — Telehealth (HOSPITAL_COMMUNITY): Payer: Self-pay | Admitting: *Deleted

## 2016-11-06 NOTE — Telephone Encounter (Signed)
Prior authorization for Zolpidem Tartrate ER received. Submitted online with cover my meds. Awaiting response to be faxed within 72 hours.

## 2016-11-20 ENCOUNTER — Emergency Department (HOSPITAL_COMMUNITY)
Admission: EM | Admit: 2016-11-20 | Discharge: 2016-11-20 | Disposition: A | Discharge status: Home / Self Care | Payer: Medicare Other | Attending: Emergency Medicine | Admitting: Emergency Medicine

## 2016-11-20 ENCOUNTER — Encounter (HOSPITAL_COMMUNITY): Payer: Self-pay | Admitting: Emergency Medicine

## 2016-11-20 DIAGNOSIS — H1589 Other disorders of sclera: Secondary | ICD-10-CM | POA: Insufficient documentation

## 2016-11-20 DIAGNOSIS — R17 Unspecified jaundice: Secondary | ICD-10-CM

## 2016-11-20 DIAGNOSIS — F17210 Nicotine dependence, cigarettes, uncomplicated: Secondary | ICD-10-CM | POA: Insufficient documentation

## 2016-11-20 DIAGNOSIS — Z79899 Other long term (current) drug therapy: Secondary | ICD-10-CM | POA: Diagnosis not present

## 2016-11-20 DIAGNOSIS — Z0189 Encounter for other specified special examinations: Secondary | ICD-10-CM

## 2016-11-20 DIAGNOSIS — Z0000 Encounter for general adult medical examination without abnormal findings: Secondary | ICD-10-CM | POA: Insufficient documentation

## 2016-11-20 LAB — CBC WITH DIFFERENTIAL/PLATELET
Basophils Absolute: 0 10*3/uL (ref 0.0–0.1)
Basophils Relative: 0 %
EOS PCT: 1 %
Eosinophils Absolute: 0.1 10*3/uL (ref 0.0–0.7)
HEMATOCRIT: 42.7 % (ref 39.0–52.0)
Hemoglobin: 14.8 g/dL (ref 13.0–17.0)
LYMPHS ABS: 2.1 10*3/uL (ref 0.7–4.0)
LYMPHS PCT: 22 %
MCH: 32.7 pg (ref 26.0–34.0)
MCHC: 34.7 g/dL (ref 30.0–36.0)
MCV: 94.3 fL (ref 78.0–100.0)
MONO ABS: 0.5 10*3/uL (ref 0.1–1.0)
MONOS PCT: 5 %
NEUTROS ABS: 7 10*3/uL (ref 1.7–7.7)
Neutrophils Relative %: 72 %
PLATELETS: 330 10*3/uL (ref 150–400)
RBC: 4.53 MIL/uL (ref 4.22–5.81)
RDW: 12.3 % (ref 11.5–15.5)
WBC: 9.7 10*3/uL (ref 4.0–10.5)

## 2016-11-20 LAB — COMPREHENSIVE METABOLIC PANEL WITH GFR
ALT: 23 U/L (ref 17–63)
AST: 17 U/L (ref 15–41)
Albumin: 4.4 g/dL (ref 3.5–5.0)
Alkaline Phosphatase: 62 U/L (ref 38–126)
Anion gap: 6 (ref 5–15)
BUN: 15 mg/dL (ref 6–20)
CO2: 27 mmol/L (ref 22–32)
Calcium: 8.9 mg/dL (ref 8.9–10.3)
Chloride: 100 mmol/L — ABNORMAL LOW (ref 101–111)
Creatinine, Ser: 0.7 mg/dL (ref 0.61–1.24)
GFR calc Af Amer: >60 mL/min
GFR calc non Af Amer: >60 mL/min
Glucose, Bld: 98 mg/dL (ref 65–99)
Potassium: 3.9 mmol/L (ref 3.5–5.1)
Sodium: 133 mmol/L — ABNORMAL LOW (ref 135–145)
Total Bilirubin: 0.4 mg/dL (ref 0.3–1.2)
Total Protein: 7 g/dL (ref 6.5–8.1)

## 2016-11-20 LAB — CARBAMAZEPINE LEVEL, TOTAL: Carbamazepine Lvl: 11.2 ug/mL (ref 4.0–12.0)

## 2016-11-20 LAB — ACETAMINOPHEN LEVEL: Acetaminophen (Tylenol), Serum: <10 ug/mL — ABNORMAL LOW (ref 10–30)

## 2016-11-20 MED ORDER — ACETAMINOPHEN 325 MG PO TABS
650.0000 mg | ORAL_TABLET | Freq: Once | ORAL | Status: DC
  Filled 2016-11-20: qty 2

## 2016-11-20 NOTE — ED Provider Notes (Signed)
[facility]EM[facility]Y DEPT Provider Note   CSN: [id number] Arrival date & time: 11/20/16  [id number]  By signing my name below, I, Placido SouLogan Joldersma, attest that this documentation has been prepared under the direction and in the presence of Marily MemosJason Banks Chaikin, MD. Electronically Signed: Placido SouLogan Joldersma, ED Scribe. 11/20/16. 9:43 AM.   History   Chief Complaint Chief Complaint  Patient presents with  . Jaundice    HPI HPI Comments: Edward Lutz is a 35 y.o. male with a h/o elevated liver enzymes who presents to the Emergency Department complaining of scleral icterus x 2 months. He states his girlfriend has noticed his symptoms and urged him to come to the ED for evaluation. His girlfriend states "they have been like that for the past couple of months and seem darker this month". Pt reports some associated, mild, "double vision when focusing on objects in the distance" x 1 year. Pt states he is seen at behavioral health and denies having had his liver enzymes checked for the past year. Pt confirms his listed medications and denies taking any additional OTC medications. He states he drinks 4-5 beers per day. Pt denies a h/o liver issus. No h/o IVDA. No h/o hepatitis or eye issues. He denies nausea, vomiting, abdominal pain or other associated symptoms at this time.   The history is provided by the patient, medical records and the spouse. No language interpreter was used.    Past Medical History:  Diagnosis Date  . Back pain   . Bipolar disorder (HCC)   . HTN (hypertension)   . OSA (obstructive sleep apnea)     Patient Active Problem List   Diagnosis Date Noted  . OCD (obsessive compulsive disorder) 04/08/2013  . Elevated liver enzymes 12/30/2012  . Unspecified vitamin D deficiency 12/30/2012  . Family history of diabetes mellitus 12/30/2012  . Pain 11/11/2012  . 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 Memory difficulties 11/11/2012  . Bipolar 1 disorder (HCC) 01/21/2012    Past Surgical  History:  Procedure Laterality Date  . RHINOPLASTY    . TONSILLECTOMY         Home Medications    Prior to Admission medications   Medication Sig Start Date End Date Taking? Authorizing Provider  alprazolam Prudy Feeler(XANAX) 2 MG tablet Take 1 tablet (2 mg total) by mouth 3 (three) times daily. 10/29/16  Yes Myrlene Brokereborah R Ross, MD  amLODipine (NORVASC) 10 MG tablet Take 5 mg by mouth at bedtime.    Yes Historical Provider, MD  buPROPion (WELLBUTRIN SR) 150 MG 12 hr tablet Take 1 tablet (150 mg total) by mouth 3 (three) times daily. 10/29/16  Yes Myrlene Brokereborah R Ross, MD  busPIRone (BUSPAR) 30 MG tablet TAKE (1) TABLET BY MOUTH (3) TIMES DAILY. 10/29/16  Yes Myrlene Brokereborah R Ross, MD  carbamazepine (TEGRETOL XR) 200 MG 12 hr tablet TAKE 7 TABLETS BY MOUTH AT BEDTIME. 10/29/16  Yes Myrlene Brokereborah R Ross, MD  levETIRAcetam (KEPPRA) 500 MG tablet Take 1,000 mg by mouth 2 (two) times daily.    Yes Historical Provider, MD  lisinopril-hydrochlorothiazide (PRINZIDE,ZESTORETIC) 20-12.5 MG tablet Take 1 tablet by mouth daily.  07/23/16  Yes Historical Provider, MD  Lurasidone HCl 120 MG TABS Take 1 tablet (120 mg total) by mouth daily with supper. 10/29/16  Yes Myrlene Brokereborah R Ross, MD  minocycline (MINOCIN,DYNACIN) 100 MG capsule Take 100 mg by mouth daily.    Yes Historical Provider, MD  Morphine-Naltrexone (EMBEDA) 30-1.2 MG CPCR Take 1 capsule by mouth daily.    Yes Historical  Provider, MD  tamsulosin (FLOMAX) 0.4 MG CAPS capsule Take 0.4 mg by mouth daily. 11/04/16  Yes Historical Provider, MD  tiZANidine (ZANAFLEX) 4 MG tablet Take 4 mg by mouth 3 (three) times daily.   Yes Historical Provider, MD  tretinoin (RETIN-A) 0.05 % cream Apply 1 application topically 2 (two) times daily. 05/20/14  Yes Historical Provider, MD  zolpidem (AMBIEN CR) 12.5 MG CR tablet Take 1 tablet (12.5 mg total) by mouth at bedtime as needed for sleep. 10/29/16 10/29/17 Yes Myrlene Broker, MD  oxyCODONE-acetaminophen (PERCOCET) 10-325 MG per tablet Take 1 tablet  by mouth every 4 (four) hours as needed for pain.    Historical Provider, MD    Family History Family History  Problem Relation Age of Onset  . Bipolar disorder Mother   . Drug abuse Mother   . Anxiety disorder Mother   . Paranoid behavior Mother   . Physical abuse Mother   . Bipolar disorder Father   . Alcohol abuse Father   . Drug abuse Father   . Anxiety disorder Father   . Paranoid behavior Father   . Physical abuse Father   . Anxiety disorder Sister   . Depression Sister   . ADD / ADHD Daughter   . Dementia Neg Hx   . OCD Neg Hx   . Schizophrenia Neg Hx   . Seizures Neg Hx   . Sexual abuse Neg Hx     Social History Social History  Substance Use Topics  . Smoking status: Current Every Day Smoker    Packs/day: 1.00    Years: 15.00    Types: Cigarettes  . Smokeless tobacco: Never Used     Comment: 16-20 cigarettes a day as of 06/08/2013  . Alcohol use 3.0 oz/week    5 Cans of beer per week     Allergies   Penicillins   Review of Systems Review of Systems  Eyes: Positive for visual disturbance. Negative for pain.       Scleral icterus  Gastrointestinal: Negative for abdominal pain, nausea and vomiting.  All other systems reviewed and are negative.  hysical Exam Updated Vital Signs BP 123/69 (BP Location: Left Arm)   Pulse 77   Temp 98.4 F (36.9 C) (Oral)   Resp 16   Ht 5\' 7"  (1.702 m)   Wt 215 lb (97.5 kg)   SpO2 99%   BMI 33.67 kg/m   Physical Exam  Constitutional: He is oriented to person, place, and time. He appears well-developed.  HENT:  Head: Normocephalic and atraumatic.  Scleral icterus. EOMs intact. PERRL. Right eye is slightly nasal oriented.   Eyes: EOM are normal.  Neck: Normal range of motion.  Cardiovascular: Normal rate, regular rhythm, normal heart sounds and intact distal pulses.   Pulmonary/Chest: Effort normal and breath sounds normal. No respiratory distress.  Abdominal: Soft. He exhibits no distension and no mass. There  is no tenderness. There is no rebound and no guarding. No hernia.  Musculoskeletal: Normal range of motion.  Neurological: He is alert and oriented to person, place, and time. No cranial nerve deficit.  Skin: Skin is warm and dry. No rash noted. No erythema. No pallor.  Psychiatric: He has a normal mood and affect. Judgment normal.  Nursing note and vitals reviewed.  ED Treatments / Results  Labs (all labs ordered are listed, but only abnormal results are displayed) Labs Reviewed  COMPREHENSIVE METABOLIC PANEL - Abnormal; Notable for the following:  Result Value   Sodium 133 (*)    Chloride 100 (*)    All other components within normal limits  CBC WITH DIFFERENTIAL/PLATELET  CARBAMAZEPINE LEVEL, TOTAL  HEPATITIS PANEL, ACUTE  ACETAMINOPHEN LEVEL    EKG  EKG Interpretation None       Radiology No results found.  Procedures Procedures  COORDINATION OF CARE: 9:43 AM Discussed next steps with pt. Pt verbalized understanding and is agreeable with the plan.    Medications Ordered in ED Medications - No data to display   Initial Impression / Assessment and Plan / ED Course  I have reviewed the triage vital signs and the nursing notes.  Pertinent labs & imaging results that were available during my care of the patient were reviewed by me and considered in my medical decision making (see chart for details).  Clinical Course     Slight scleral icterus of unknown etiology. Labs within normal limits. Awaiting hepatitis panel however that wouldn't change anything we do here in the emergency department. Also with mild strabismus is causing some double vision. This is been pretty consistent over last couple years so will follow-up with his doctor if it gets any worse.   I personally performed the services described in this documentation, which was scribed in my presence. The recorded information has been reviewed and is accurate.   Final Clinical Impressions(s) / ED  Diagnoses   Final diagnoses:  Examination for, laboratory  Scleral icterus    New Prescriptions New Prescriptions   No medications on file     Marily MemosJason Huda Petrey, MD 11/20/16 1117

## 2016-11-20 NOTE — ED Triage Notes (Signed)
Girlfriend has noted yellow eyes for last month but more increasing in last 2 weeks.  Went to Dr Loney HeringBluth this am but MD out of town until next week.  [place] could not be seen there.  Denies any pain at this time.

## 2016-11-21 LAB — HEPATITIS PANEL, ACUTE
HCV Ab: <0.1 s/co ratio (ref 0.0–0.9)
HEP B C IGM: NEGATIVE
HEP B S AG: NEGATIVE
Hep A IgM: NEGATIVE

## 2016-12-08 ENCOUNTER — Other Ambulatory Visit (HOSPITAL_COMMUNITY): Payer: Self-pay | Admitting: Psychiatry

## 2016-12-11 ENCOUNTER — Telehealth (HOSPITAL_COMMUNITY): Payer: Self-pay | Admitting: *Deleted

## 2016-12-11 ENCOUNTER — Other Ambulatory Visit (HOSPITAL_COMMUNITY): Payer: Self-pay | Admitting: Psychiatry

## 2016-12-11 MED ORDER — CARBAMAZEPINE ER 200 MG PO TB12: ORAL_TABLET | ORAL | 2 refills | Status: DC

## 2016-12-11 NOTE — Telephone Encounter (Signed)
sent 

## 2016-12-11 NOTE — Telephone Encounter (Signed)
noted 

## 2016-12-11 NOTE — Telephone Encounter (Signed)
Called pt and informed him and he verbalized understanding.  

## 2016-12-11 NOTE — Telephone Encounter (Signed)
Pt called stating he would like refills for his Tegretol XR due to taking his last tablet today. Pt number [phone].

## 2016-12-25 ENCOUNTER — Other Ambulatory Visit (HOSPITAL_COMMUNITY): Payer: Self-pay | Admitting: Psychiatry

## 2016-12-30 ENCOUNTER — Telehealth (HOSPITAL_COMMUNITY): Payer: Self-pay | Admitting: *Deleted

## 2016-12-30 MED ORDER — ZOLPIDEM TARTRATE 10 MG PO TABS: 10.0000 mg | ORAL_TABLET | Freq: Every evening | ORAL | 0 refills | Status: DC | PRN

## 2016-12-30 NOTE — Telephone Encounter (Signed)
Called pt pharmacy and spoke with April. Called in Ambien 10 mg QHS PRN. Per April they will get medication ready for pt.

## 2016-12-30 NOTE — Telephone Encounter (Signed)
Called pt spoke and informed him that his Ambien CR P.A was sent out back in November and was informed that PA should have decision in 72 hrs but office will call back to f/u with it. Per pt that's fine office do not have to. Per pt he stated retaking his Ambien short acting again and it works. Per pt, he do not know what happened but the short acting Ambien is working and would just like for Dr. Tenny Crawoss to call in more refills to his pharmacy instead. Per pt he will be taking his last short acting Ambien tonight.

## 2016-12-30 NOTE — Telephone Encounter (Signed)
Called pt pharmacy due to request for Ambien 10 mg. Spoke with April at pt pharmacy. Per Dr. Tenny Crawoss to call in 30 days with 0 refill.

## 2016-12-30 NOTE — Telephone Encounter (Signed)
Please call in one month supply

## 2016-12-30 NOTE — Telephone Encounter (Signed)
Phone call from patient, he need his Ambien.   He would rather stay on what he has been on rather than have anything.

## 2017-01-06 ENCOUNTER — Other Ambulatory Visit (HOSPITAL_COMMUNITY): Payer: Self-pay | Admitting: Psychiatry

## 2017-01-29 ENCOUNTER — Encounter (HOSPITAL_COMMUNITY): Payer: Self-pay | Admitting: Psychiatry

## 2017-01-29 ENCOUNTER — Ambulatory Visit (INDEPENDENT_AMBULATORY_CARE_PROVIDER_SITE_OTHER): Payer: Medicare Other | Admitting: Psychiatry

## 2017-01-29 VITALS — BP 112/70 | HR 71 | Ht 67.0 in | Wt 218.0 lb

## 2017-01-29 DIAGNOSIS — Z79899 Other long term (current) drug therapy: Secondary | ICD-10-CM

## 2017-01-29 DIAGNOSIS — F1921 Other psychoactive substance dependence, in remission: Secondary | ICD-10-CM | POA: Diagnosis not present

## 2017-01-29 DIAGNOSIS — Z9889 Other specified postprocedural states: Secondary | ICD-10-CM | POA: Diagnosis not present

## 2017-01-29 DIAGNOSIS — Z880 Allergy status to penicillin: Secondary | ICD-10-CM

## 2017-01-29 DIAGNOSIS — F319 Bipolar disorder, unspecified: Secondary | ICD-10-CM | POA: Diagnosis not present

## 2017-01-29 MED ORDER — BUPROPION HCL ER (SR) 150 MG PO TB12: 150.0000 mg | ORAL_TABLET | Freq: Three times a day (TID) | ORAL | 3 refills | Status: DC

## 2017-01-29 MED ORDER — BUSPIRONE HCL 30 MG PO TABS: ORAL_TABLET | ORAL | 2 refills | Status: DC

## 2017-01-29 MED ORDER — CARBAMAZEPINE ER 200 MG PO TB12: ORAL_TABLET | ORAL | 2 refills | Status: DC

## 2017-01-29 MED ORDER — LURASIDONE HCL 120 MG PO TABS: 120.0000 mg | ORAL_TABLET | Freq: Every day | ORAL | 2 refills | Status: DC

## 2017-01-29 MED ORDER — ALPRAZOLAM 2 MG PO TABS: 2.0000 mg | ORAL_TABLET | Freq: Three times a day (TID) | ORAL | 2 refills | Status: DC

## 2017-01-29 MED ORDER — ZOLPIDEM TARTRATE 10 MG PO TABS: 10.0000 mg | ORAL_TABLET | Freq: Every evening | ORAL | 2 refills | Status: DC | PRN

## 2017-01-29 NOTE — Progress Notes (Signed)
Patient ID: Edward JockBryant E Machia, male   DOB: 28-Nov-1981, 36 y.o.   MRN: [id number] Patient ID: Edward Lutz, male   DOB: 28-Nov-1981, 36 y.o.   MRN: [id number] Patient ID: Edward Lutz, male   DOB: 28-Nov-1981, 36 y.o.   MRN: [id number] Patient ID: Edward Lutz, male   DOB: 28-Nov-1981, 36 y.o.   MRN: [id number] Patient ID: Edward Lutz, male   DOB: 28-Nov-1981, 36 y.o.   MRN: [id number] Patient ID: Edward Lutz, male   DOB: 28-Nov-1981, 36 y.o.   MRN: [id number] Patient ID: Edward Lutz, male   DOB: 28-Nov-1981, 36 y.o.   MRN: [id number] Patient ID: Edward Lutz, male   DOB: 28-Nov-1981, 36 y.o.   MRN: [id number] Patient ID: Edward Lutz, male   DOB: 28-Nov-1981, 36 y.o.   MRN: [id number] Patient ID: Edward JockBryant E Cormany, male   DOB: 28-Nov-1981, 36 y.o.   MRN: [id number] Patient ID: Edward JockBryant E Epler, male   DOB: 28-Nov-1981, 36 y.o.   MRN: [id number] Patient ID: Edward JockBryant E Gracia, male   DOB: 28-Nov-1981, 36 y.o.   MRN: [id number] Patient ID: Edward JockBryant E Relph, male   DOB: 28-Nov-1981, 36 y.o.   MRN: [id number] Patient ID: Edward JockBryant E Russ, male   DOB: 28-Nov-1981, 36 y.o.   MRN: [id number] [facility] [id number] Progress Note Edward Lutz MRN: [id number] DOB: 28-Nov-1981 Age: 36 y.o.  Date: 01/29/2017 Start Time: 8:35 AM End Time: 8:59 AM  Chief Complaint: Chief Complaint  Patient presents with  . Manic Behavior  . Depression  . Anxiety  . Follow-up  Subjective: "I'm A little better"  This patient is a 36 year old married white male who lives with his wife, a daughter 633 years old and a boy 37 years old in [place]. He is on disability.  The patient states that he has a history of both substance abuse and bipolar disorder. About 10 years ago he was heavily using crack cocaine and marijuana and alcohol. He developed severe mood issues and was diagnosed as bipolar. He was hospitalized twice in 2009 at [facility] for suicide attempts. He he still has significant problems with short-term  memory and temper but he feels he is more stable and he's ever been. He thinks his current combination of medicines has been very helpful. His mood is stable and he is able to take care of his family while his wife works and he is sleeping well.  The patient returns after 3 months. For the most part he is doing pretty well. He still has episodes of feeling paranoid about his wife and worrying that she might be seeing someone else. She's been faithful to him for 20 years so he realizes this is a bit ridiculous. Other than that he is functioning fairly well and staying busy with the children. His sleep is on and off but he feels like he gets a lot of sleep for the most part we could not get Ambien CR approved so he is remaining on regular Ambien. He admits that he still drinks 4-5 beers a night. He was recently seen in the ED for scleral icterus but all his labs were normal including liver function tests and hepatitis panel. He doesn't want to consider cutting down his drinking BP 112/70 (BP Location: Right Arm, Patient Position: Sitting, Cuff Size: Large)   Pulse 71   Ht 5\' 7"  (1.702 m)   Wt 218 lb (98.9 kg)   BMI 34.14 kg/m   Allergies:  Allergies  Allergen Reactions  . Penicillins Rash    Has patient had a PCN reaction causing immediate rash, facial/tongue/throat swelling, SOB or lightheadedness with hypotension: Yes Has patient had a PCN reaction causing severe rash involving mucus membranes or skin necrosis: No Has patient had a PCN reaction that required hospitalization No Has patient had a PCN reaction occurring within the last 10 years: No If all of the above answers are "NO", then may proceed with Cephalosporin use.    Medical History: Past Medical History:  Diagnosis Date  . Back pain   . Bipolar disorder (HCC)   . HTN (hypertension)   . OSA (obstructive sleep apnea)    Surgical History: Past Surgical History:  Procedure Laterality Date  . RHINOPLASTY    . TONSILLECTOMY      Family History: family history includes ADD / ADHD in his daughter; Alcohol abuse in his father; Anxiety disorder in his father, mother, and sister; Bipolar disorder in his father and mother; Depression in his sister; Drug abuse in his father and mother; Paranoid behavior in his father and mother; Physical abuse in his father and mother. Reviewed and nothing new today.   Past psychiatric history [facility] after abusing cocaine with suicidal plan to shoot himself.  Alcohol and substance use history Patient has history of using crack and cocaine in the past.   Mental status examination Patient is casually dressed and fairly groomed. He maintained fair eye contact. He is In a good mood mood and seems bright and cheerful today He denies any auditory or visual hallucination. He states that he has been a little bit paranoid t but not as much as before and he is trying to work this out with his wife He denies any thoughts of suicide or self-harm. He has no shaking or tremor He's alert and oriented x3. His insight judgment and impulse control is okay. He realizes that he has a quick temper and for the most part just stays away from people who might upset him   Lab Results:  Results for orders placed or performed during the hospital encounter of 11/20/16 (from the past 8736 hour(s))  Comprehensive metabolic panel   Collection Time: 11/20/16  9:51 AM  Result Value Ref Range   Sodium 133 (L) 135 - 145 mmol/L   Potassium 3.9 3.5 - 5.1 mmol/L   Chloride 100 (L) 101 - 111 mmol/L   CO2 27 22 - 32 mmol/L   Glucose, Bld 98 65 - 99 mg/dL   BUN 15 6 - 20 mg/dL   Creatinine, Ser 2.13 0.61 - 1.24 mg/dL   Calcium 8.9 8.9 - 08.6 mg/dL   Total Protein 7.0 6.5 - 8.1 g/dL   Albumin 4.4 3.5 - 5.0 g/dL   AST 17 15 - 41 U/L   ALT 23 17 - 63 U/L   Alkaline Phosphatase 62 38 - 126 U/L   Total Bilirubin 0.4 0.3 - 1.2 mg/dL   GFR calc non Af Amer >60 >60 mL/min    GFR calc Af Amer >60 >60 mL/min   Anion gap 6 5 - 15  CBC with Differential   Collection Time: 11/20/16  9:51 AM  Result Value Ref Range   WBC 9.7 4.0 - 10.5 K/uL   RBC 4.53 4.22 - 5.81 MIL/uL   Hemoglobin 14.8 13.0 - 17.0 g/dL   HCT 57.8 46.9 - 62.9 %   MCV 94.3 78.0 - 100.0 fL   MCH  32.7 26.0 - 34.0 pg   MCHC 34.7 30.0 - 36.0 g/dL   RDW 16.1 09.6 - 04.5 %   Platelets 330 150 - 400 K/uL   Neutrophils Relative % 72 %   Neutro Abs 7.0 1.7 - 7.7 K/uL   Lymphocytes Relative 22 %   Lymphs Abs 2.1 0.7 - 4.0 K/uL   Monocytes Relative 5 %   Monocytes Absolute 0.5 0.1 - 1.0 K/uL   Eosinophils Relative 1 %   Eosinophils Absolute 0.1 0.0 - 0.7 K/uL   Basophils Relative 0 %   Basophils Absolute 0.0 0.0 - 0.1 K/uL  Hepatitis panel, acute   Collection Time: 11/20/16  9:51 AM  Result Value Ref Range   Hepatitis B Surface Ag Negative Negative   HCV Ab <0.1 0.0 - 0.9 s/co ratio   Hep A IgM Negative Negative   Hep B C IgM Negative Negative  Carbamazepine level, total   Collection Time: 11/20/16  9:51 AM  Result Value Ref Range   Carbamazepine Lvl 11.2 4.0 - 12.0 ug/mL  Acetaminophen level   Collection Time: 11/20/16  9:51 AM  Result Value Ref Range   Acetaminophen (Tylenol), Serum <10 (L) 10 - 30 ug/mL   Assessment Axis I Bipolar disorder with psychotic features, polysubstance dependence in partial remission,  Axis II deferred Axis III see medical history Axis IV moderate  Plan/Discussion: I took his vitals.  I reviewed CC, tobacco/med/surg Hx, meds effects/ side effects, problem list, therapies and responses as well as current situation/symptoms discussed options. He'll continue Wellbutrin for depression, carbamazepine  mood stabilization and Xanax for anxiety.Xanax will be 6 mg daily. He will continue BuSpar 30 mg mg 3 times a day for anxiety. He will continue Latuda 120 mg at dinner and t Ambien 10mg  at bedtime for sleep He'll return in 3 months  or call if his mood symptoms worsen  before that  See orders and pt instructions for more details.  Meds ordered this encounter  Medications  . buPROPion (WELLBUTRIN SR) 150 MG 12 hr tablet    Sig: Take 1 tablet (150 mg total) by mouth 3 (three) times daily.    Dispense:  90 tablet    Refill:  3  . busPIRone (BUSPAR) 30 MG tablet    Sig: TAKE (1) TABLET BY MOUTH (3) TIMES DAILY.    Dispense:  90 tablet    Refill:  2  . carbamazepine (TEGRETOL XR) 200 MG 12 hr tablet    Sig: TAKE 7 TABLETS BY MOUTH AT BEDTIME.    Dispense:  210 tablet    Refill:  2  . Lurasidone HCl 120 MG TABS    Sig: Take 1 tablet (120 mg total) by mouth daily with supper.    Dispense:  30 tablet    Refill:  2  . zolpidem (AMBIEN) 10 MG tablet    Sig: Take 1 tablet (10 mg total) by mouth at bedtime as needed for sleep.    Dispense:  30 tablet    Refill:  2  . alprazolam (XANAX) 2 MG tablet    Sig: Take 1 tablet (2 mg total) by mouth 3 (three) times daily.    Dispense:  90 tablet    Refill:  2    Medical Decision Making Problem Points:  Established problem, stable/improving (1), Established problem, worsening (2), Review of last therapy session (1) and Review of psycho-social stressors (1) Data Points:  Review or order clinical lab tests (1) Review of medication regiment &  side effects (2) Review of new medications or change in dosage (2)  I certify that outpatient services furnished can reasonably be expected to improve the patient's condition.   Levonne Spiller, MD

## 2017-03-25 ENCOUNTER — Ambulatory Visit: Payer: Self-pay | Admitting: Urology

## 2017-04-07 ENCOUNTER — Other Ambulatory Visit (HOSPITAL_COMMUNITY): Payer: Self-pay | Admitting: Psychiatry

## 2017-04-07 ENCOUNTER — Telehealth (HOSPITAL_COMMUNITY): Payer: Self-pay | Admitting: *Deleted

## 2017-04-07 NOTE — Telephone Encounter (Signed)
Pt called stating he need refills for his medications and do not know which ones. Staff called pt pharmacy and spoke with Sherwood. Per Lorin Picket the only script pt need at this time is his Xanax. Per Arline Asp, the last written script they have was written in November. Asked Lorin Picket if he could look in pt hold profile and see if theres one that was written 01-29-2017. Per Lorin Picket, they have it ready for pt. Called pt and informed him with information and he verbalized understanding

## 2017-04-28 ENCOUNTER — Encounter (HOSPITAL_COMMUNITY): Payer: Self-pay | Admitting: Psychiatry

## 2017-04-28 ENCOUNTER — Ambulatory Visit (INDEPENDENT_AMBULATORY_CARE_PROVIDER_SITE_OTHER): Payer: Medicare Other | Admitting: Psychiatry

## 2017-04-28 VITALS — BP 113/70 | HR 90 | Ht 67.0 in | Wt 209.2 lb

## 2017-04-28 DIAGNOSIS — Z811 Family history of alcohol abuse and dependence: Secondary | ICD-10-CM | POA: Diagnosis not present

## 2017-04-28 DIAGNOSIS — Z813 Family history of other psychoactive substance abuse and dependence: Secondary | ICD-10-CM | POA: Diagnosis not present

## 2017-04-28 DIAGNOSIS — Z818 Family history of other mental and behavioral disorders: Secondary | ICD-10-CM | POA: Diagnosis not present

## 2017-04-28 DIAGNOSIS — Z79899 Other long term (current) drug therapy: Secondary | ICD-10-CM

## 2017-04-28 DIAGNOSIS — F319 Bipolar disorder, unspecified: Secondary | ICD-10-CM

## 2017-04-28 MED ORDER — CARBAMAZEPINE ER 200 MG PO TB12
ORAL_TABLET | ORAL | 2 refills | Status: DC
Start: 2017-04-28 — End: 2017-07-29

## 2017-04-28 MED ORDER — BUPROPION HCL ER (SR) 150 MG PO TB12: 150.0000 mg | ORAL_TABLET | Freq: Three times a day (TID) | ORAL | 3 refills | Status: DC

## 2017-04-28 MED ORDER — ZOLPIDEM TARTRATE 10 MG PO TABS: 10.0000 mg | ORAL_TABLET | Freq: Every evening | ORAL | 2 refills | Status: DC | PRN

## 2017-04-28 MED ORDER — ALPRAZOLAM 2 MG PO TABS: 2.0000 mg | ORAL_TABLET | Freq: Three times a day (TID) | ORAL | 2 refills | Status: DC

## 2017-04-28 MED ORDER — BUSPIRONE HCL 30 MG PO TABS: ORAL_TABLET | ORAL | 2 refills | Status: DC

## 2017-04-28 MED ORDER — LURASIDONE HCL 120 MG PO TABS: 120.0000 mg | ORAL_TABLET | Freq: Every day | ORAL | 2 refills | Status: DC

## 2017-04-28 NOTE — Progress Notes (Signed)
Patient ID: Edward Lutz, male   DOB: 03-Oct-1981, 36 y.o.   MRN: [id number] Patient ID: Edward Lutz, male   DOB: 1980-12-20, 36 y.o.   MRN: [id number] Patient ID: Edward Lutz, male   DOB: 06-25-1981, 36 y.o.   MRN: [id number] Patient ID: Edward Lutz, male   DOB: [birth year], 36 y.o.   MRN: [id number] Patient ID: Edward Lutz, male   DOB: [birth year], 36 y.o.   MRN: [id number] Patient ID: Edward Lutz, male   DOB: 06-15-1981, 36 y.o.   MRN: [id number] Patient ID: Edward Lutz, male   DOB: [birth year], 36 y.o.   MRN: [id number] Patient ID: Edward Lutz, male   DOB: Feb 29, 1981, 36 y.o.   MRN: [id number] Patient ID: Edward Lutz, male   DOB: 01/09/1981, 36 y.o.   MRN: [id number] Patient ID: Edward Lutz, male   DOB: Oct 20, 1981, 36 y.o.   MRN: [id number] Patient ID: Edward Lutz, male   DOB: [birth year], 36 y.o.   MRN: [id number] Patient ID: Edward Lutz, male   DOB: 1981/09/12, 36 y.o.   MRN: [id number] Patient ID: Edward Lutz, male   DOB: 24-Sep-1981, 36 y.o.   MRN: [id number] Patient ID: Edward Lutz, male   DOB: 03/18/1981, 36 y.o.   MRN: [id number] [facility] [id number] Progress Note Edward Lutz MRN: [id number] DOB: 19-Dec-1980 Age: 36 y.o.  Date: 04/28/2017 Start Time: 8:35 AM End Time: 8:59 AM  Chief Complaint: Chief Complaint  Patient presents with  . Depression  . Anxiety  . Manic Behavior  . Follow-up  Subjective: "I'm Doing okay  This patient is a 36 year old married white male who lives with his wife, a daughter 28 years old and a boy 26 years old in [place]. He is on disability.  The patient states that he has a history of both substance abuse and bipolar disorder. About 10 years ago he was heavily using crack cocaine and marijuana and alcohol. He developed severe mood issues and was diagnosed as bipolar. He was hospitalized twice in 2009 at [facility] for suicide attempts. He he still has significant problems with short-term memory  and temper but he feels he is more stable and he's ever been. He thinks his current combination of medicines has been very helpful. His mood is stable and he is able to take care of his family while his wife works and he is sleeping well.  The patient returns after 3 months. For the most part he is doing pretty well. He still has episodes of feeling paranoid about his wife when she goes to work. He tries to distract himself and stay busy with his children. His mood is been fairly stable and he sleeping well with the Ambien. He denies recent temper outbursts or suicidal or homicidal ideation BP 113/70 (BP Location: Right Arm, Patient Position: Sitting, Cuff Size: Large)   Pulse 90   Ht 5\' 7"  (1.702 m)   Wt 209 lb 3.2 oz (94.9 kg)   BMI 32.77 kg/m   Allergies: Allergies  Allergen Reactions  . Penicillins Rash    Has patient had a PCN reaction causing immediate rash, facial/tongue/throat swelling, SOB or lightheadedness with hypotension: Yes Has patient had a PCN reaction causing severe rash involving mucus membranes or skin necrosis: No Has patient had a PCN reaction that required hospitalization No Has patient had a PCN reaction occurring within the last 10 years: No If all of the above answers are "NO",  then may proceed with Cephalosporin use.    Medical History: Past Medical History:  Diagnosis Date  . Back pain   . Bipolar disorder (HCC)   . HTN (hypertension)   . OSA (obstructive sleep apnea)    Surgical History: Past Surgical History:  Procedure Laterality Date  . RHINOPLASTY    . TONSILLECTOMY     Family History: family history includes ADD / ADHD in his daughter; Alcohol abuse in his father; Anxiety disorder in his father, mother, and sister; Bipolar disorder in his father and mother; Depression in his sister; Drug abuse in his father and mother; Paranoid behavior in his father and mother; Physical abuse in his father and mother. Reviewed and nothing new today.   Past  psychiatric history [facility]using [facility] with suicidal plan to shoot himself.  Alcohol and substance use history Patient has history of using crack and cocaine in the past.   Mental status examination Patient is casually dressed and fairly groomed. He maintained fair eye contact. He states his mood is fairly good and his affect is appropriate today He denies any auditory or visual hallucination. He states that he has been a little bit paranoid t but not as much as before and he is trying to work this out with his wife He denies any thoughts of suicide or self-harm. He has no shaking or tremor He's alert and oriented x3. His insight judgment and impulse control is okay. He realizes that he has a quick temper and for the most part just stays away from people who might upset him   Lab Results:  Results for orders placed or performed during the hospital encounter of 11/20/16 (from the past 8736 hour(s))  Comprehensive metabolic panel   Collection Time: 11/20/16  9:51 AM  Result Value Ref Range   Sodium 133 (L) 135 - 145 mmol/L   Potassium 3.9 3.5 - 5.1 mmol/L   Chloride 100 (L) 101 - 111 mmol/L   CO2 27 22 - 32 mmol/L   Glucose, Bld 98 65 - 99 mg/dL   BUN 15 6 - 20 mg/dL   Creatinine, Ser 1.610.70 0.61 - 1.24 mg/dL   Calcium 8.9 8.9 - 09.610.3 mg/dL   Total Protein 7.0 6.5 - 8.1 g/dL   Albumin 4.4 3.5 - 5.0 g/dL   AST 17 15 - 41 U/L   ALT 23 17 - 63 U/L   Alkaline Phosphatase 62 38 - 126 U/L   Total Bilirubin 0.4 0.3 - 1.2 mg/dL   GFR calc non Af Amer >60 >60 mL/min   GFR calc Af Amer >60 >60 mL/min   Anion gap 6 5 - 15  CBC with Differential   Collection Time: 11/20/16  9:51 AM  Result Value Ref Range   WBC 9.7 4.0 - 10.5 K/uL   RBC 4.53 4.22 - 5.81 MIL/uL   Hemoglobin 14.8 13.0 - 17.0 g/dL   HCT 04.542.7 40.939.0 - 81.152.0 %   MCV 94.3 78.0 - 100.0 fL   MCH 32.7 26.0 - 34.0 pg   MCHC 34.7 30.0 - 36.0 g/dL   RDW 91.412.3 78.211.5 - 95.615.5 %    Platelets 330 150 - 400 K/uL   Neutrophils Relative % 72 %   Neutro Abs 7.0 1.7 - 7.7 K/uL   Lymphocytes Relative 22 %   Lymphs Abs 2.1 0.7 - 4.0 K/uL   Monocytes Relative 5 %   Monocytes Absolute 0.5 0.1 - 1.0 K/uL  Eosinophils Relative 1 %   Eosinophils Absolute 0.1 0.0 - 0.7 K/uL   Basophils Relative 0 %   Basophils Absolute 0.0 0.0 - 0.1 K/uL  Hepatitis panel, acute   Collection Time: 11/20/16  9:51 AM  Result Value Ref Range   Hepatitis B Surface Ag Negative Negative   HCV Ab <0.1 0.0 - 0.9 s/co ratio   Hep A IgM Negative Negative   Hep B C IgM Negative Negative  Carbamazepine level, total   Collection Time: 11/20/16  9:51 AM  Result Value Ref Range   Carbamazepine Lvl 11.2 4.0 - 12.0 ug/mL  Acetaminophen level   Collection Time: 11/20/16  9:51 AM  Result Value Ref Range   Acetaminophen (Tylenol), Serum <10 (L) 10 - 30 ug/mL   Assessment Axis I Bipolar disorder with psychotic features, polysubstance dependence in partial remission,  Axis II deferred Axis III see medical history Axis IV moderate  Plan/Discussion: I took his vitals.  I reviewed CC, tobacco/med/surg Hx, meds effects/ side effects, problem list, therapies and responses as well as current situation/symptoms discussed options. He'll continue Wellbutrin for depression, carbamazepine  mood stabilization and Xanax for anxiety.Xanax will be 6 mg daily. He will continue BuSpar 30 mg mg 3 times a day for anxiety. He will continue Latuda 120 mg at dinner and  Ambien 10mg  at bedtime for sleep He'll return in 3 months  or call if his mood symptoms worsen before that  See orders and pt instructions for more details.  Meds ordered this encounter  Medications  . Lurasidone HCl 120 MG TABS    Sig: Take 1 tablet (120 mg total) by mouth daily with supper.    Dispense:  90 tablet    Refill:  2  . carbamazepine (TEGRETOL XR) 200 MG 12 hr tablet    Sig: TAKE 7 TABLETS BY MOUTH AT BEDTIME.    Dispense:  210 tablet     Refill:  2  . busPIRone (BUSPAR) 30 MG tablet    Sig: TAKE (1) TABLET BY MOUTH (3) TIMES DAILY.    Dispense:  90 tablet    Refill:  2  . buPROPion (WELLBUTRIN SR) 150 MG 12 hr tablet    Sig: Take 1 tablet (150 mg total) by mouth 3 (three) times daily.    Dispense:  90 tablet    Refill:  3  . alprazolam (XANAX) 2 MG tablet    Sig: Take 1 tablet (2 mg total) by mouth 3 (three) times daily.    Dispense:  90 tablet    Refill:  2  . zolpidem (AMBIEN) 10 MG tablet    Sig: Take 1 tablet (10 mg total) by mouth at bedtime as needed for sleep.    Dispense:  30 tablet    Refill:  2    Medical Decision Making Problem Points:  Established problem, stable/improving (1), Established problem, worsening (2), Review of last therapy session (1) and Review of psycho-social stressors (1) Data Points:  Review or order clinical lab tests (1) Review of medication regiment & side effects (2) Review of new medications or change in dosage (2)  I certify that outpatient services furnished can reasonably be expected to improve the patient's condition.   [name], MD

## 2017-04-29 ENCOUNTER — Other Ambulatory Visit (HOSPITAL_COMMUNITY): Payer: Self-pay | Admitting: Psychiatry

## 2017-05-01 ENCOUNTER — Telehealth (HOSPITAL_COMMUNITY): Payer: Self-pay | Admitting: *Deleted

## 2017-05-01 NOTE — Telephone Encounter (Signed)
Dr. Tenny Crawoss handed a printed copy of pt Ambien and Xanax. Per provider to call in both medications for pt due to when pt was in office for f/u providers was having computer problems. Pt is aware medications will be called into pharmacy. Called pt pharmacy and spoke with Harrold DonathNathan the pharmacist who verbalized understanding. Scripts ID number is [id number] and Order ID numbers are [id number] and [id number]. Printed scripts were shredded.

## 2017-06-10 ENCOUNTER — Ambulatory Visit: Payer: Self-pay | Admitting: Urology

## 2017-06-11 DIAGNOSIS — M545 Low back pain: Secondary | ICD-10-CM | POA: Diagnosis not present

## 2017-06-11 DIAGNOSIS — G894 Chronic pain syndrome: Secondary | ICD-10-CM | POA: Diagnosis not present

## 2017-06-11 DIAGNOSIS — Z79899 Other long term (current) drug therapy: Secondary | ICD-10-CM | POA: Diagnosis not present

## 2017-06-11 DIAGNOSIS — J45909 Unspecified asthma, uncomplicated: Secondary | ICD-10-CM | POA: Diagnosis not present

## 2017-07-08 ENCOUNTER — Other Ambulatory Visit (HOSPITAL_COMMUNITY): Payer: Self-pay | Admitting: Psychiatry

## 2017-07-29 ENCOUNTER — Encounter (HOSPITAL_COMMUNITY): Payer: Self-pay | Admitting: Psychiatry

## 2017-07-29 ENCOUNTER — Ambulatory Visit (INDEPENDENT_AMBULATORY_CARE_PROVIDER_SITE_OTHER): Payer: Medicare Other | Admitting: Psychiatry

## 2017-07-29 VITALS — BP 100/66 | HR 65 | Ht 67.0 in | Wt 215.8 lb

## 2017-07-29 DIAGNOSIS — F319 Bipolar disorder, unspecified: Secondary | ICD-10-CM | POA: Diagnosis not present

## 2017-07-29 DIAGNOSIS — Z813 Family history of other psychoactive substance abuse and dependence: Secondary | ICD-10-CM

## 2017-07-29 DIAGNOSIS — Z736 Limitation of activities due to disability: Secondary | ICD-10-CM

## 2017-07-29 DIAGNOSIS — Z811 Family history of alcohol abuse and dependence: Secondary | ICD-10-CM

## 2017-07-29 DIAGNOSIS — Z818 Family history of other mental and behavioral disorders: Secondary | ICD-10-CM

## 2017-07-29 DIAGNOSIS — F1921 Other psychoactive substance dependence, in remission: Secondary | ICD-10-CM | POA: Diagnosis not present

## 2017-07-29 MED ORDER — CARBAMAZEPINE ER 200 MG PO TB12: ORAL_TABLET | ORAL | 2 refills | Status: DC

## 2017-07-29 MED ORDER — ZOLPIDEM TARTRATE 10 MG PO TABS: 10.0000 mg | ORAL_TABLET | Freq: Every evening | ORAL | 2 refills | Status: DC | PRN

## 2017-07-29 MED ORDER — BUSPIRONE HCL 30 MG PO TABS
ORAL_TABLET | ORAL | 2 refills | Status: DC
Start: 2017-07-29 — End: 2017-11-05

## 2017-07-29 MED ORDER — BUPROPION HCL ER (SR) 150 MG PO TB12: 150.0000 mg | ORAL_TABLET | Freq: Three times a day (TID) | ORAL | 3 refills | Status: DC

## 2017-07-29 MED ORDER — ALPRAZOLAM 2 MG PO TABS: 2.0000 mg | ORAL_TABLET | Freq: Three times a day (TID) | ORAL | 2 refills | Status: DC

## 2017-07-29 MED ORDER — LURASIDONE HCL 120 MG PO TABS: 120.0000 mg | ORAL_TABLET | Freq: Every day | ORAL | 2 refills | Status: DC

## 2017-07-29 NOTE — Progress Notes (Signed)
Patient ID: Edward Lutz, male   DOB: 02-10-1981, 36 y.o.   MRN: [id number] Patient ID: Edward Lutz, male   DOB: 02-10-1981, 36 y.o.   MRN: [id number] Patient ID: Edward Lutz, male   DOB: 02-10-1981, 36 y.o.   MRN: [id number] Patient ID: Edward Lutz, male   DOB: 02-10-1981, 36 y.o.   MRN: [id number] Patient ID: Edward Lutz, male   DOB: 02-10-1981, 36 y.o.   MRN: [id number] Patient ID: Edward Lutz, male   DOB: 02-10-1981, 36 y.o.   MRN: [id number] Patient ID: Edward Lutz, male   DOB: 02-10-1981, 36 y.o.   MRN: [id number] Patient ID: Edward Lutz, male   DOB: 02-10-1981, 36 y.o.   MRN: [id number] Patient ID: Edward JockBryant E Arceneaux, male   DOB: 02-10-1981, 36 y.o.   MRN: [id number] Patient ID: Edward JockBryant E Hammitt, male   DOB: 02-10-1981, 36 y.o.   MRN: [id number] Patient ID: Edward JockBryant E Perl, male   DOB: 02-10-1981, 36 y.o.   MRN: [id number] Patient ID: Edward JockBryant E Campione, male   DOB: 02-10-1981, 36 y.o.   MRN: [id number] Patient ID: Edward JockBryant E Baney, male   DOB: 02-10-1981, 36 y.o.   MRN: [id number] Patient ID: Edward JockBryant E Cueto, male   DOB: 02-10-1981, 36 y.o.   MRN: [id number] [facility] [id number] Progress Note Edward Lutz MRN: [id number] DOB: 02-10-1981 Age: 36 y.o.  Date: 07/29/2017 Start Time: 8:35 AM End Time: 8:59 AM  Chief Complaint: Chief Complaint  Patient presents with  . Depression  . Manic Behavior  . Follow-up  Subjective: "I'm Doing okay  This patient is a 36 year old married white male who lives with his wife, a daughter 37 years old and a boy 37 years old in [place]. He is on disability.  The patient states that he has a history of both substance abuse and bipolar disorder. About 10 years ago he was heavily using crack cocaine and marijuana and alcohol. He developed severe mood issues and was diagnosed as bipolar. He was hospitalized twice in 2009 at [facility] for suicide attempts. He he still has significant problems with short-term memory and temper  but he feels he is more stable and he's ever been. He thinks his current combination of medicines has been very helpful. His mood is stable and he is able to take care of his family while his wife works and he is sleeping well.  The patient returns after 3 months. For the most part he is doing pretty well. He still has episodes of feeling paranoid about his wife when she goes to work. He tries to distract himself and stay busy with his children. His mood is been fairly stable and he sleeping well with the Ambien. He denies recent temper outbursts or suicidal or homicidal ideation. He recently went on a camping trip with his family and had a great time. He seems much more relaxed today BP 100/66 (BP Location: Right Arm, Patient Position: Sitting, Cuff Size: Large)   Pulse 65   Ht 5\' 7"  (1.702 m)   Wt 215 lb 12.8 oz (97.9 kg)   BMI 33.80 kg/m   Allergies: Allergies  Allergen Reactions  . Penicillins Rash    Has patient had a PCN reaction causing immediate rash, facial/tongue/throat swelling, SOB or lightheadedness with hypotension: Yes Has patient had a PCN reaction causing severe rash involving mucus membranes or skin necrosis: No Has patient had a PCN reaction that required hospitalization No Has patient had  a PCN reaction occurring within the last 10 years: No If all of the above answers are "NO", then may proceed with Cephalosporin use.    Medical History: Past Medical History:  Diagnosis Date  . Back pain   . Bipolar disorder (HCC)   . HTN (hypertension)   . OSA (obstructive sleep apnea)    Surgical History: Past Surgical History:  Procedure Laterality Date  . RHINOPLASTY    . TONSILLECTOMY     Family History: family history includes ADD / ADHD in his daughter; Alcohol abuse in his father; Anxiety disorder in his father, mother, and sister; Bipolar disorder in his father and mother; Depression in his sister; Drug abuse in his father and mother; Paranoid behavior in his father  and mother; Physical abuse in his father and mother. Reviewed and nothing new today.   [facility] after abusing [facility] with suicidal plan to shoot himself.  Alcohol and substance use history Patient has history of using crack and cocaine in the past.   Mental status examination Patient is casually dressed and fairly groomed. He maintained fair eye contact. He states his mood is fairly good and his affect is appropriate today He denies any auditory or visual hallucination. He states that he has been a little bit paranoid  but not as much as before and he is trying to work this out with his wife He denies any thoughts of suicide or self-harm. He has no shaking or tremor He's alert and oriented x3. His insight judgment and impulse control is okay. He realizes that he has a quick temper and for the most part just stays away from people who might upset him. His nephew is with them and they seem very close   Lab Results:  Results for orders placed or performed during the hospital encounter of 11/20/16 (from the past 8736 hour(s))  Comprehensive metabolic panel   Collection Time: 11/20/16  9:51 AM  Result Value Ref Range   Sodium 133 (L) 135 - 145 mmol/L   Potassium 3.9 3.5 - 5.1 mmol/L   Chloride 100 (L) 101 - 111 mmol/L   CO2 27 22 - 32 mmol/L   Glucose, Bld 98 65 - 99 mg/dL   BUN 15 6 - 20 mg/dL   Creatinine, Ser 1.61 0.61 - 1.24 mg/dL   Calcium 8.9 8.9 - 09.6 mg/dL   Total Protein 7.0 6.5 - 8.1 g/dL   Albumin 4.4 3.5 - 5.0 g/dL   AST 17 15 - 41 U/L   ALT 23 17 - 63 U/L   Alkaline Phosphatase 62 38 - 126 U/L   Total Bilirubin 0.4 0.3 - 1.2 mg/dL   GFR calc non Af Amer >60 >60 mL/min   GFR calc Af Amer >60 >60 mL/min   Anion gap 6 5 - 15  CBC with Differential   Collection Time: 11/20/16  9:51 AM  Result Value Ref Range   WBC 9.7 4.0 - 10.5 K/uL   RBC 4.53 4.22 - 5.81 MIL/uL   Hemoglobin 14.8 13.0 - 17.0 g/dL    HCT 04.5 40.9 - 81.1 %   MCV 94.3 78.0 - 100.0 fL   MCH 32.7 26.0 - 34.0 pg   MCHC 34.7 30.0 - 36.0 g/dL   RDW 91.4 78.2 - 95.6 %   Platelets 330 150 - 400 K/uL   Neutrophils Relative % 72 %   Neutro Abs 7.0 1.7 - 7.7 K/uL  Lymphocytes Relative 22 %   Lymphs Abs 2.1 0.7 - 4.0 K/uL   Monocytes Relative 5 %   Monocytes Absolute 0.5 0.1 - 1.0 K/uL   Eosinophils Relative 1 %   Eosinophils Absolute 0.1 0.0 - 0.7 K/uL   Basophils Relative 0 %   Basophils Absolute 0.0 0.0 - 0.1 K/uL  Hepatitis panel, acute   Collection Time: 11/20/16  9:51 AM  Result Value Ref Range   Hepatitis B Surface Ag Negative Negative   HCV Ab <0.1 0.0 - 0.9 s/co ratio   Hep A IgM Negative Negative   Hep B C IgM Negative Negative  Carbamazepine level, total   Collection Time: 11/20/16  9:51 AM  Result Value Ref Range   Carbamazepine Lvl 11.2 4.0 - 12.0 ug/mL  Acetaminophen level   Collection Time: 11/20/16  9:51 AM  Result Value Ref Range   Acetaminophen (Tylenol), Serum <10 (L) 10 - 30 ug/mL   Assessment Axis I Bipolar disorder with psychotic features, polysubstance dependence in partial remission,  Axis II deferred Axis III see medical history Axis IV moderate  Plan/Discussion: I took his vitals.  I reviewed CC, tobacco/med/surg Hx, meds effects/ side effects, problem list, therapies and responses as well as current situation/symptoms discussed options. He'll continue Wellbutrin for depression, carbamazepine  mood stabilization and Xanax for anxiety.Xanax will be 6 mg daily. He will continue BuSpar 30 mg mg 3 times a day for anxiety. He will continue Latuda 120 mg at dinner and  Ambien 10mg  at bedtime for sleep He'll return in 3 months  or call if his mood symptoms worsen before that  See orders and pt instructions for more details.  Meds ordered this encounter  Medications  . carbamazepine (TEGRETOL XR) 200 MG 12 hr tablet    Sig: TAKE 7 TABLETS BY MOUTH AT BEDTIME.    Dispense:  210 tablet     Refill:  2  . busPIRone (BUSPAR) 30 MG tablet    Sig: TAKE (1) TABLET BY MOUTH (3) TIMES DAILY.    Dispense:  90 tablet    Refill:  2  . buPROPion (WELLBUTRIN SR) 150 MG 12 hr tablet    Sig: Take 1 tablet (150 mg total) by mouth 3 (three) times daily.    Dispense:  90 tablet    Refill:  3  . Lurasidone HCl 120 MG TABS    Sig: Take 1 tablet (120 mg total) by mouth daily with supper.    Dispense:  90 tablet    Refill:  2  . zolpidem (AMBIEN) 10 MG tablet    Sig: Take 1 tablet (10 mg total) by mouth at bedtime as needed for sleep.    Dispense:  30 tablet    Refill:  2  . alprazolam (XANAX) 2 MG tablet    Sig: Take 1 tablet (2 mg total) by mouth 3 (three) times daily.    Dispense:  90 tablet    Refill:  2    Medical Decision Making Problem Points:  Established problem, stable/improving (1), Established problem, worsening (2), Review of last therapy session (1) and Review of psycho-social stressors (1) Data Points:  Review or order clinical lab tests (1) Review of medication regiment & side effects (2) Review of new medications or change in dosage (2)  I certify that outpatient services furnished can reasonably be expected to improve the patient's condition.   [name], MD

## 2017-07-30 ENCOUNTER — Other Ambulatory Visit (HOSPITAL_COMMUNITY): Payer: Self-pay | Admitting: Psychiatry

## 2017-08-01 ENCOUNTER — Other Ambulatory Visit (HOSPITAL_COMMUNITY): Payer: Self-pay | Admitting: Psychiatry

## 2017-08-11 DIAGNOSIS — G894 Chronic pain syndrome: Secondary | ICD-10-CM | POA: Diagnosis not present

## 2017-08-11 DIAGNOSIS — Z79899 Other long term (current) drug therapy: Secondary | ICD-10-CM | POA: Diagnosis not present

## 2017-08-11 DIAGNOSIS — J45909 Unspecified asthma, uncomplicated: Secondary | ICD-10-CM | POA: Diagnosis not present

## 2017-08-11 DIAGNOSIS — M545 Low back pain: Secondary | ICD-10-CM | POA: Diagnosis not present

## 2017-09-10 DIAGNOSIS — I10 Essential (primary) hypertension: Secondary | ICD-10-CM | POA: Diagnosis not present

## 2017-09-10 DIAGNOSIS — Z23 Encounter for immunization: Secondary | ICD-10-CM | POA: Diagnosis not present

## 2017-09-10 DIAGNOSIS — G8929 Other chronic pain: Secondary | ICD-10-CM | POA: Diagnosis not present

## 2017-09-10 DIAGNOSIS — L709 Acne, unspecified: Secondary | ICD-10-CM | POA: Diagnosis not present

## 2017-09-10 DIAGNOSIS — Z0000 Encounter for general adult medical examination without abnormal findings: Secondary | ICD-10-CM | POA: Diagnosis not present

## 2017-09-26 DIAGNOSIS — Z0000 Encounter for general adult medical examination without abnormal findings: Secondary | ICD-10-CM | POA: Diagnosis not present

## 2017-09-26 DIAGNOSIS — E782 Mixed hyperlipidemia: Secondary | ICD-10-CM | POA: Diagnosis not present

## 2017-09-26 DIAGNOSIS — I10 Essential (primary) hypertension: Secondary | ICD-10-CM | POA: Diagnosis not present

## 2017-09-30 DIAGNOSIS — Z23 Encounter for immunization: Secondary | ICD-10-CM | POA: Diagnosis not present

## 2017-09-30 DIAGNOSIS — I10 Essential (primary) hypertension: Secondary | ICD-10-CM | POA: Diagnosis not present

## 2017-09-30 DIAGNOSIS — Z0000 Encounter for general adult medical examination without abnormal findings: Secondary | ICD-10-CM | POA: Diagnosis not present

## 2017-10-06 ENCOUNTER — Other Ambulatory Visit (HOSPITAL_COMMUNITY): Payer: Self-pay | Admitting: Psychiatry

## 2017-10-08 DIAGNOSIS — G894 Chronic pain syndrome: Secondary | ICD-10-CM | POA: Diagnosis not present

## 2017-10-08 DIAGNOSIS — M545 Low back pain: Secondary | ICD-10-CM | POA: Diagnosis not present

## 2017-10-08 DIAGNOSIS — Z79899 Other long term (current) drug therapy: Secondary | ICD-10-CM | POA: Diagnosis not present

## 2017-10-29 ENCOUNTER — Other Ambulatory Visit (HOSPITAL_COMMUNITY): Payer: Self-pay | Admitting: Psychiatry

## 2017-10-29 ENCOUNTER — Ambulatory Visit (HOSPITAL_COMMUNITY): Payer: Self-pay | Admitting: Psychiatry

## 2017-11-04 ENCOUNTER — Other Ambulatory Visit (HOSPITAL_COMMUNITY): Payer: Self-pay | Admitting: Psychiatry

## 2017-11-05 ENCOUNTER — Other Ambulatory Visit (HOSPITAL_COMMUNITY): Payer: Self-pay | Admitting: *Deleted

## 2017-11-05 ENCOUNTER — Telehealth (HOSPITAL_COMMUNITY): Payer: Self-pay | Admitting: *Deleted

## 2017-11-05 ENCOUNTER — Other Ambulatory Visit (HOSPITAL_COMMUNITY): Payer: Self-pay | Admitting: Psychiatry

## 2017-11-05 MED ORDER — ZOLPIDEM TARTRATE 10 MG PO TABS: 10.0000 mg | ORAL_TABLET | Freq: Every day | ORAL | 0 refills | Status: DC

## 2017-11-05 MED ORDER — CARBAMAZEPINE ER 200 MG PO TB12: ORAL_TABLET | ORAL | 2 refills | Status: DC

## 2017-11-05 MED ORDER — ALPRAZOLAM 2 MG PO TABS: 2.0000 mg | ORAL_TABLET | Freq: Three times a day (TID) | ORAL | 0 refills | Status: DC

## 2017-11-05 MED ORDER — BUPROPION HCL ER (SR) 150 MG PO TB12: 150.0000 mg | ORAL_TABLET | Freq: Three times a day (TID) | ORAL | 3 refills | Status: DC

## 2017-11-05 MED ORDER — LURASIDONE HCL 120 MG PO TABS: 120.0000 mg | ORAL_TABLET | Freq: Every day | ORAL | 2 refills | Status: DC

## 2017-11-05 MED ORDER — BUSPIRONE HCL 30 MG PO TABS: ORAL_TABLET | ORAL | 2 refills | Status: DC

## 2017-11-05 NOTE — Telephone Encounter (Signed)
Spoke with patient & informed per Dr Tenny Crawoss: that she sent in sent everything else in, and that I would call in  to [facility] the xanax 2 mg #90, one tid no refills and Ambien 10 mg #30, one at bedtime, no refills and inform pt

## 2017-11-05 NOTE — Telephone Encounter (Signed)
I sent everything else in, please call in xanax 2 mg #90, one tid no refills and Ambien 10 mg #30, one at bedtime, no refills and inform pt

## 2017-11-05 NOTE — Telephone Encounter (Signed)
Dr Tenny Crawoss Patient called in stating that [facility] informed him his refill request where denied  due to he needed appointment. Well he states that tomorrow will be his pill & that his appointment is on Monday  11/26 @ 8:00am. And will it be okay for him to miss 3 days without medication? [phone]

## 2017-11-10 ENCOUNTER — Ambulatory Visit (INDEPENDENT_AMBULATORY_CARE_PROVIDER_SITE_OTHER): Payer: Medicare Other | Admitting: Psychiatry

## 2017-11-10 ENCOUNTER — Encounter (HOSPITAL_COMMUNITY): Payer: Self-pay | Admitting: Psychiatry

## 2017-11-10 VITALS — BP 114/68 | HR 92 | Ht 67.0 in | Wt 216.0 lb

## 2017-11-10 DIAGNOSIS — Z813 Family history of other psychoactive substance abuse and dependence: Secondary | ICD-10-CM

## 2017-11-10 DIAGNOSIS — Z818 Family history of other mental and behavioral disorders: Secondary | ICD-10-CM

## 2017-11-10 DIAGNOSIS — M549 Dorsalgia, unspecified: Secondary | ICD-10-CM

## 2017-11-10 DIAGNOSIS — F319 Bipolar disorder, unspecified: Secondary | ICD-10-CM

## 2017-11-10 DIAGNOSIS — Z811 Family history of alcohol abuse and dependence: Secondary | ICD-10-CM

## 2017-11-10 DIAGNOSIS — F17210 Nicotine dependence, cigarettes, uncomplicated: Secondary | ICD-10-CM | POA: Diagnosis not present

## 2017-11-10 DIAGNOSIS — M2550 Pain in unspecified joint: Secondary | ICD-10-CM

## 2017-11-10 MED ORDER — BUSPIRONE HCL 30 MG PO TABS: ORAL_TABLET | ORAL | 2 refills | Status: DC

## 2017-11-10 MED ORDER — CARBAMAZEPINE ER 200 MG PO TB12
ORAL_TABLET | ORAL | 2 refills | Status: DC
Start: 2017-11-10 — End: 2018-02-10

## 2017-11-10 MED ORDER — ALPRAZOLAM 2 MG PO TABS: 2.0000 mg | ORAL_TABLET | Freq: Three times a day (TID) | ORAL | 2 refills | Status: DC

## 2017-11-10 MED ORDER — ZOLPIDEM TARTRATE 10 MG PO TABS: 10.0000 mg | ORAL_TABLET | Freq: Every day | ORAL | 2 refills | Status: DC

## 2017-11-10 MED ORDER — LURASIDONE HCL 120 MG PO TABS: 120.0000 mg | ORAL_TABLET | Freq: Every day | ORAL | 2 refills | Status: DC

## 2017-11-10 MED ORDER — BUPROPION HCL ER (SR) 150 MG PO TB12: 150.0000 mg | ORAL_TABLET | Freq: Three times a day (TID) | ORAL | 3 refills | Status: DC

## 2017-11-10 NOTE — Progress Notes (Signed)
BH MD/PA/NP OP Progress Note  11/10/2017 8:41 AM Edward Lutz  MRN:  [id number]  Chief Complaint:  Chief Complaint    Depression; Anxiety; Manic Behavior; Follow-up     WUJ:WJXB patient is a 36 year old married white male who lives with his wife, a daughter 64 years old and a boy 8 years old in [place]. He is on disability.  The patient states that he has a history of both substance abuse and bipolar disorder. About 10 years ago he was heavily using crack cocaine and marijuana and alcohol. He developed severe mood issues and was diagnosed as bipolar. He was hospitalized twice in 2009 at [facility] for suicide attempts. He he still has significant problems with short-term memory and temper but he feels he is more stable and he's ever been. He thinks his current combination of medicines has been very helpful. His mood is stable and he is able to take care of his family while his wife works and he is sleeping well.  The patient and his wife return after 3 months.  For the most part he is doing about the same.  He is very negative about the holidays.  This is particular because he is been arguing with his sister who wants to regain custody of her 64-year-old child that the patient and his wife are raising.  He states that he does not like being around people and does not like getting together with family in the least.  He states that his mood is fairly stable and has been "up and down" he still thinks the combination of medications is helpful and he is able to sleep and do what he needs to do. Visit Diagnosis:    ICD-10-CM   1. Bipolar 1 disorder (HCC) F31.9     Past Psychiatric History: Past admissions in 2009 for suicide attempts  Past Medical History:  Past Medical History:  Diagnosis Date  . Back pain   . Bipolar disorder (HCC)   . HTN (hypertension)   . OSA (obstructive sleep apnea)     Past Surgical History:  Procedure Laterality Date  . RHINOPLASTY    .  TONSILLECTOMY      Family Psychiatric History: See below  Family History:  Family History  Problem Relation Age of Onset  . Bipolar disorder Mother   . Drug abuse Mother   . Anxiety disorder Mother   . Paranoid behavior Mother   . Physical abuse Mother   . Bipolar disorder Father   . Alcohol abuse Father   . Drug abuse Father   . Anxiety disorder Father   . Paranoid behavior Father   . Physical abuse Father   . Anxiety disorder Sister   . Depression Sister   . ADD / ADHD Daughter   . Dementia Neg Hx   . OCD Neg Hx   . Schizophrenia Neg Hx   . Seizures Neg Hx   . Sexual abuse Neg Hx     Social History:  Social History   Socioeconomic History  . Marital status: Married    Spouse name: None  . Number of children: None  . Years of education: None  . Highest education level: None  Social Needs  . Financial resource strain: None  . Food insecurity - worry: None  . Food insecurity - inability: None  . Transportation needs - medical: None  . Transportation needs - non-medical: None  Occupational History  . None  Tobacco Use  . Smoking status:  Current Every Day Smoker    Packs/day: 1.00    Years: 15.00    Pack years: 15.00    Types: Cigarettes  . Smokeless tobacco: Never Used  . Tobacco comment: 16-20 cigarettes a day as of 06/08/2013  Substance and Sexual Activity  . Alcohol use: Yes    Alcohol/week: 3.0 oz    Types: 5 Cans of beer per week  . Drug use: No  . Sexual activity: Yes  Other Topics Concern  . None  Social History Narrative  . None    Allergies:  Allergies  Allergen Reactions  . Penicillins Rash    Has patient had a PCN reaction causing immediate rash, facial/tongue/throat swelling, SOB or lightheadedness with hypotension: Yes Has patient had a PCN reaction causing severe rash involving mucus membranes or skin necrosis: No Has patient had a PCN reaction that required hospitalization No Has patient had a PCN reaction occurring within the last  10 years: No If all of the above answers are "NO", then may proceed with Cephalosporin use.     Metabolic Disorder Labs: Lab Results  Component Value Date   HGBA1C 5.6 12/30/2012   MPG 114 12/30/2012   No results found for: PROLACTIN No results found for: CHOL, TRIG, HDL, CHOLHDL, VLDL, LDLCALC No results found for: TSH  Therapeutic Level Labs: Lab Results  Component Value Date   LITHIUM 1.05 01/21/2012   LITHIUM 0.97 10/24/2011   Lab Results  Component Value Date   VALPROATE 60.5 06/16/2008   No components found for:  CBMZ  Current Medications: Current Outpatient Medications  Medication Sig Dispense Refill  . alprazolam (XANAX) 2 MG tablet Take 1 tablet (2 mg total) by mouth 3 (three) times daily. 90 tablet 2  . amLODipine (NORVASC) 10 MG tablet Take 5 mg by mouth at bedtime.     Marland Kitchen. buPROPion (WELLBUTRIN SR) 150 MG 12 hr tablet Take 1 tablet (150 mg total) by mouth 3 (three) times daily. 90 tablet 3  . busPIRone (BUSPAR) 30 MG tablet TAKE (1) TABLET BY MOUTH (3) TIMES DAILY. 90 tablet 2  . carbamazepine (TEGRETOL XR) 200 MG 12 hr tablet TAKE 7 TABLETS BY MOUTH AT BEDTIME. 210 tablet 2  . levETIRAcetam (KEPPRA) 500 MG tablet Take 1,000 mg by mouth 2 (two) times daily.     Marland Kitchen. lisinopril-hydrochlorothiazide (PRINZIDE,ZESTORETIC) 20-12.5 MG tablet Take 1 tablet by mouth daily.     . Lurasidone HCl 120 MG TABS Take 1 tablet (120 mg total) by mouth daily with supper. 90 tablet 2  . minocycline (MINOCIN,DYNACIN) 100 MG capsule Take 100 mg by mouth daily.     . Morphine-Naltrexone (EMBEDA) 30-1.2 MG CPCR Take 1 capsule by mouth daily.     Marland Kitchen. oxyCODONE-acetaminophen (PERCOCET) 10-325 MG per tablet Take 1 tablet by mouth every 4 (four) hours as needed for pain.    . tamsulosin (FLOMAX) 0.4 MG CAPS capsule Take 0.4 mg by mouth daily.    Marland Kitchen. tiZANidine (ZANAFLEX) 4 MG tablet Take 4 mg by mouth 3 (three) times daily.    Marland Kitchen. tretinoin (RETIN-A) 0.05 % cream Apply 1 application topically 2  (two) times daily.    Marland Kitchen. zolpidem (AMBIEN) 10 MG tablet Take 1 tablet (10 mg total) by mouth at bedtime. 30 tablet 2   No current facility-administered medications for this visit.      Musculoskeletal: Strength & Muscle Tone: within normal limits Gait & Station: normal Patient leans: N/A  Psychiatric Specialty Exam: Review of Systems  Musculoskeletal: Positive  for back pain and joint pain.  All other systems reviewed and are negative.   Blood pressure 114/68, pulse 92, height 5\' 7"  (1.702 m), weight 216 lb (98 kg), SpO2 99 %.Body mass index is 33.83 kg/m.  General Appearance: Casual and Fairly Groomed  Eye Contact:  Fair  Speech:  Clear and Coherent  Volume:  Normal  Mood:  Dysphoric  Affect:  Constricted  Thought Process:  Goal Directed  Orientation:  Full (Time, Place, and Person)  Thought Content: Rumination   Suicidal Thoughts:  No  Homicidal Thoughts:  No  Memory:  Immediate;   Good Recent;   Good Remote;   Fair  Judgement:  Fair  Insight:  Lacking  Psychomotor Activity:  Normal  Concentration:  Concentration: Good and Attention Span: Good  Recall:  Good  Fund of Knowledge: Good  Language: Good  Akathisia:  No  Handed:  Right  AIMS (if indicated): not done  Assets:  Communication Skills Desire for Improvement Resilience Social Support Talents/Skills  ADL's:  Intact  Cognition: WNL  Sleep:  Fair   Screenings:   Assessment and Plan: This patient is a 36 year old male with a history of bipolar disorder, particularly evidenced by irritability and mood swings.  He seems to be fairly stable.  He will continue Tegretol 1400 mg at bedtime, Latuda 120 mg daily, BuSpar 30 mg 3 times daily, Wellbutrin SR 150 mg 3 times daily, Xanax 2 mg 3 times a day for anxiety and Ambien 10 mg daily for sleep.  He will return to see me in 3 months   Diannia Rudereborah Vladislav Axelson, MD 11/10/2017, 8:41 AM

## 2017-12-03 DIAGNOSIS — I10 Essential (primary) hypertension: Secondary | ICD-10-CM | POA: Diagnosis not present

## 2017-12-03 DIAGNOSIS — G603 Idiopathic progressive neuropathy: Secondary | ICD-10-CM | POA: Diagnosis not present

## 2017-12-03 DIAGNOSIS — M545 Low back pain: Secondary | ICD-10-CM | POA: Diagnosis not present

## 2017-12-03 DIAGNOSIS — Z79891 Long term (current) use of opiate analgesic: Secondary | ICD-10-CM | POA: Diagnosis not present

## 2017-12-30 ENCOUNTER — Encounter (HOSPITAL_COMMUNITY): Payer: Self-pay | Admitting: Psychiatry

## 2017-12-30 ENCOUNTER — Telehealth (HOSPITAL_COMMUNITY): Payer: Self-pay | Admitting: *Deleted

## 2017-12-30 NOTE — Telephone Encounter (Signed)
Dr Ross Patient called asking for a letter [facility] Patient stated "he don't think they would want him trying to make a decision".  He stated that with all his issues he doesn't think he could handle it.

## 2017-12-30 NOTE — Telephone Encounter (Signed)
Spoke with patient informed that a letter to the courts to be excused has been provide  by the doctor.  Did get the adresst to mail  If prefers.

## 2017-12-30 NOTE — Telephone Encounter (Signed)
done

## 2017-12-30 NOTE — Telephone Encounter (Signed)
Dr Tenny Crawoss Patient called back after speaking with the courts. And was informed that a letter would be accepted   but the word PERMANTELY  would need to be in the letter. Otherwise he would only be excused for a   2 year period vs. benign taken off jury duty for ever.

## 2018-01-08 ENCOUNTER — Telehealth (HOSPITAL_COMMUNITY): Payer: Self-pay | Admitting: *Deleted

## 2018-01-08 NOTE — Telephone Encounter (Signed)
Patient called left vm that letter dr Tenny Crawross wrote excusing him from jury duty PERMANTELY.  He received notice from courts that his full name including middle name has to be on the letter.

## 2018-02-02 DIAGNOSIS — I10 Essential (primary) hypertension: Secondary | ICD-10-CM | POA: Diagnosis not present

## 2018-02-02 DIAGNOSIS — Z79891 Long term (current) use of opiate analgesic: Secondary | ICD-10-CM | POA: Diagnosis not present

## 2018-02-02 DIAGNOSIS — G603 Idiopathic progressive neuropathy: Secondary | ICD-10-CM | POA: Diagnosis not present

## 2018-02-02 DIAGNOSIS — M545 Low back pain: Secondary | ICD-10-CM | POA: Diagnosis not present

## 2018-02-10 ENCOUNTER — Encounter (HOSPITAL_COMMUNITY): Payer: Self-pay | Admitting: Psychiatry

## 2018-02-10 ENCOUNTER — Ambulatory Visit (INDEPENDENT_AMBULATORY_CARE_PROVIDER_SITE_OTHER): Payer: Medicare Other | Admitting: Psychiatry

## 2018-02-10 VITALS — BP 138/87 | HR 83 | Ht 67.0 in | Wt 217.0 lb

## 2018-02-10 DIAGNOSIS — Z818 Family history of other mental and behavioral disorders: Secondary | ICD-10-CM

## 2018-02-10 DIAGNOSIS — Z813 Family history of other psychoactive substance abuse and dependence: Secondary | ICD-10-CM

## 2018-02-10 DIAGNOSIS — Z811 Family history of alcohol abuse and dependence: Secondary | ICD-10-CM | POA: Diagnosis not present

## 2018-02-10 DIAGNOSIS — M549 Dorsalgia, unspecified: Secondary | ICD-10-CM

## 2018-02-10 DIAGNOSIS — F1099 Alcohol use, unspecified with unspecified alcohol-induced disorder: Secondary | ICD-10-CM

## 2018-02-10 DIAGNOSIS — F319 Bipolar disorder, unspecified: Secondary | ICD-10-CM | POA: Diagnosis not present

## 2018-02-10 DIAGNOSIS — Z736 Limitation of activities due to disability: Secondary | ICD-10-CM | POA: Diagnosis not present

## 2018-02-10 DIAGNOSIS — Z915 Personal history of self-harm: Secondary | ICD-10-CM

## 2018-02-10 DIAGNOSIS — F17210 Nicotine dependence, cigarettes, uncomplicated: Secondary | ICD-10-CM | POA: Diagnosis not present

## 2018-02-10 MED ORDER — CARBAMAZEPINE ER 200 MG PO TB12: ORAL_TABLET | ORAL | 2 refills | Status: DC

## 2018-02-10 MED ORDER — BUPROPION HCL ER (SR) 150 MG PO TB12: 150.0000 mg | ORAL_TABLET | Freq: Three times a day (TID) | ORAL | 3 refills | Status: DC

## 2018-02-10 MED ORDER — ZOLPIDEM TARTRATE 10 MG PO TABS: 10.0000 mg | ORAL_TABLET | Freq: Every day | ORAL | 2 refills | Status: DC

## 2018-02-10 MED ORDER — LURASIDONE HCL 120 MG PO TABS: 120.0000 mg | ORAL_TABLET | Freq: Every day | ORAL | 2 refills | Status: DC

## 2018-02-10 MED ORDER — ALPRAZOLAM 2 MG PO TABS: 2.0000 mg | ORAL_TABLET | Freq: Three times a day (TID) | ORAL | 2 refills | Status: DC

## 2018-02-10 MED ORDER — BUSPIRONE HCL 30 MG PO TABS: ORAL_TABLET | ORAL | 2 refills | Status: DC

## 2018-02-10 NOTE — Progress Notes (Signed)
BH MD/PA/NP OP Progress Note  02/10/2018 8:23 AM Edward Lutz  MRN:  [id number]  Chief Complaint:  Chief Complaint    Depression; Anxiety; Manic Behavior; Follow-up     HPI: This patient is a 37 year old married white male who lives with his wife, a daughter 65 years old and a boy 31 years old in [place]. He is on disability.  The patient states that he has a history of both substance abuse and bipolar disorder. About 10 years ago he was heavily using crack cocaine and marijuana and alcohol. He developed severe mood issues and was diagnosed as bipolar. He was hospitalized twice in 2009 at [facility] for suicide attempts. He he still has significant problems with short-term memory and temper but he feels he is more stable and he's ever been. He thinks his current combination of medicines has been very helpful. His mood is stable and he is able to take care of his family while his wife works and he is sleeping well.  The patient returns after 3 months.  He is by himself today.  He states that he is doing well.  Spending most of his time taking his kids back and forth to school and making sure they are getting homework done etc.  He and his wife have custody of his 38-year-old nephew as well.  He states that his mood is pretty good and is no longer as angry and irritable as he used to be.  His sleep is pretty good with the Ambien although he still wakes up a few times at night.  He denies being tired during the day he denies being angry or paranoid like he has been in the past.  He still feels like his current regimen is been very helpful to control his mood and anger Visit Diagnosis:    ICD-10-CM   1. Bipolar 1 disorder (HCC) F31.9     Past Psychiatric History: Past admissions in 2009 for suicide attempts.  Past Medical History:  Past Medical History:  Diagnosis Date  . Back pain   . Bipolar disorder (HCC)   . HTN (hypertension)   . OSA (obstructive sleep apnea)      Past Surgical History:  Procedure Laterality Date  . RHINOPLASTY    . TONSILLECTOMY      Family Psychiatric History: See below  Family History:  Family History  Problem Relation Age of Onset  . Bipolar disorder Mother   . Drug abuse Mother   . Anxiety disorder Mother   . Paranoid behavior Mother   . Physical abuse Mother   . Bipolar disorder Father   . Alcohol abuse Father   . Drug abuse Father   . Anxiety disorder Father   . Paranoid behavior Father   . Physical abuse Father   . Anxiety disorder Sister   . Depression Sister   . ADD / ADHD Daughter   . Dementia Neg Hx   . OCD Neg Hx   . Schizophrenia Neg Hx   . Seizures Neg Hx   . Sexual abuse Neg Hx     Social History:  Social History   Socioeconomic History  . Marital status: Married    Spouse name: None  . Number of children: None  . Years of education: None  . Highest education level: None  Social Needs  . Financial resource strain: None  . Food insecurity - worry: None  . Food insecurity - inability: None  . Transportation needs - medical:  None  . Transportation needs - non-medical: None  Occupational History  . None  Tobacco Use  . Smoking status: Current Every Day Smoker    Packs/day: 1.00    Years: 15.00    Pack years: 15.00    Types: Cigarettes  . Smokeless tobacco: Never Used  . Tobacco comment: 16-20 cigarettes a day as of 06/08/2013  Substance and Sexual Activity  . Alcohol use: Yes    Alcohol/week: 3.0 oz    Types: 5 Cans of beer per week  . Drug use: No  . Sexual activity: Yes  Other Topics Concern  . None  Social History Narrative  . None    Allergies:  Allergies  Allergen Reactions  . Penicillins Rash    Has patient had a PCN reaction causing immediate rash, facial/tongue/throat swelling, SOB or lightheadedness with hypotension: Yes Has patient had a PCN reaction causing severe rash involving mucus membranes or skin necrosis: No Has patient had a PCN reaction that required  hospitalization No Has patient had a PCN reaction occurring within the last 10 years: No If all of the above answers are "NO", then may proceed with Cephalosporin use.     Metabolic Disorder Labs: Lab Results  Component Value Date   HGBA1C 5.6 12/30/2012   MPG 114 12/30/2012   No results found for: PROLACTIN No results found for: CHOL, TRIG, HDL, CHOLHDL, VLDL, LDLCALC No results found for: TSH  Therapeutic Level Labs: Lab Results  Component Value Date   LITHIUM 1.05 01/21/2012   LITHIUM 0.97 10/24/2011   Lab Results  Component Value Date   VALPROATE 60.5 06/16/2008   No components found for:  CBMZ  Current Medications: Current Outpatient Medications  Medication Sig Dispense Refill  . alprazolam (XANAX) 2 MG tablet Take 1 tablet (2 mg total) by mouth 3 (three) times daily. 90 tablet 2  . amLODipine (NORVASC) 10 MG tablet Take 5 mg by mouth at bedtime.     Marland Kitchen. buPROPion (WELLBUTRIN SR) 150 MG 12 hr tablet Take 1 tablet (150 mg total) by mouth 3 (three) times daily. 90 tablet 3  . busPIRone (BUSPAR) 30 MG tablet TAKE (1) TABLET BY MOUTH (3) TIMES DAILY. 90 tablet 2  . carbamazepine (TEGRETOL XR) 200 MG 12 hr tablet TAKE 7 TABLETS BY MOUTH AT BEDTIME. 210 tablet 2  . levETIRAcetam (KEPPRA) 500 MG tablet Take 1,000 mg by mouth 2 (two) times daily.     Marland Kitchen. lisinopril-hydrochlorothiazide (PRINZIDE,ZESTORETIC) 20-12.5 MG tablet Take 1 tablet by mouth daily.     . Lurasidone HCl 120 MG TABS Take 1 tablet (120 mg total) by mouth daily with supper. 90 tablet 2  . minocycline (MINOCIN,DYNACIN) 100 MG capsule Take 100 mg by mouth daily.     . Morphine-Naltrexone (EMBEDA) 30-1.2 MG CPCR Take 1 capsule by mouth daily.     Marland Kitchen. oxyCODONE-acetaminophen (PERCOCET) 10-325 MG per tablet Take 1 tablet by mouth every 4 (four) hours as needed for pain.    . tamsulosin (FLOMAX) 0.4 MG CAPS capsule Take 0.4 mg by mouth daily.    Marland Kitchen. tiZANidine (ZANAFLEX) 4 MG tablet Take 4 mg by mouth 3 (three) times  daily.    Marland Kitchen. tretinoin (RETIN-A) 0.05 % cream Apply 1 application topically 2 (two) times daily.    Marland Kitchen. zolpidem (AMBIEN) 10 MG tablet Take 1 tablet (10 mg total) by mouth at bedtime. 30 tablet 2   No current facility-administered medications for this visit.      Musculoskeletal: Strength & Muscle  Tone: within normal limits Gait & Station: normal Patient leans: N/A  Psychiatric Specialty Exam: Review of Systems  Musculoskeletal: Positive for back pain.  All other systems reviewed and are negative.   Blood pressure 138/87, pulse 83, height 5\' 7"  (1.702 m), weight 217 lb (98.4 kg), SpO2 99 %.Body mass index is 33.99 kg/m.  General Appearance: Casual and Fairly Groomed  Eye Contact:  Fair  Speech:  Clear and Coherent  Volume:  Normal  Mood:  Euthymic  Affect:  Congruent  Thought Process:  Goal Directed  Orientation:  Full (Time, Place, and Person)  Thought Content: WDL   Suicidal Thoughts:  No  Homicidal Thoughts:  No  Memory:  Immediate;   Good Recent;   Good Remote;   Good  Judgement:  Fair  Insight:  Fair  Psychomotor Activity:  Normal  Concentration:  Concentration: Good and Attention Span: Good  Recall:  Good  Fund of Knowledge: Good  Language: Good  Akathisia:  No  Handed:  Right  AIMS (if indicated): not done  Assets:  Communication Skills Desire for Improvement Physical Health Resilience Social Support Talents/Skills  ADL's:  Intact  Cognition: WNL  Sleep:  Fair   Screenings:   Assessment and Plan: Patient is a 37 year old white male with a history of significant mood issues probable bipolar disorder and a remote history of polysubstance abuse.  He is been very stable on his current regimen.  He will continue Wellbutrin SR 150 mg 3 times a day for depression, BuSpar 30 mg 3 times a day for anxiety, Xanax 2 mg 3 times a day also for anxiety, Tegretol-XR 1400 mg at bedtime for mood stabilization and Ambien 10 mg at bedtime for sleep.  He will return to see me  in 3 months   [name], MD 02/10/2018, 8:23 AM

## 2018-03-30 DIAGNOSIS — M545 Low back pain: Secondary | ICD-10-CM | POA: Diagnosis not present

## 2018-03-30 DIAGNOSIS — I10 Essential (primary) hypertension: Secondary | ICD-10-CM | POA: Diagnosis not present

## 2018-03-30 DIAGNOSIS — G603 Idiopathic progressive neuropathy: Secondary | ICD-10-CM | POA: Diagnosis not present

## 2018-03-30 DIAGNOSIS — Z79891 Long term (current) use of opiate analgesic: Secondary | ICD-10-CM | POA: Diagnosis not present

## 2018-03-31 DIAGNOSIS — G894 Chronic pain syndrome: Secondary | ICD-10-CM | POA: Diagnosis not present

## 2018-03-31 DIAGNOSIS — I10 Essential (primary) hypertension: Secondary | ICD-10-CM | POA: Diagnosis not present

## 2018-03-31 DIAGNOSIS — E782 Mixed hyperlipidemia: Secondary | ICD-10-CM | POA: Diagnosis not present

## 2018-03-31 DIAGNOSIS — L709 Acne, unspecified: Secondary | ICD-10-CM | POA: Diagnosis not present

## 2018-05-12 ENCOUNTER — Encounter (HOSPITAL_COMMUNITY): Payer: Self-pay | Admitting: Psychiatry

## 2018-05-12 ENCOUNTER — Ambulatory Visit (INDEPENDENT_AMBULATORY_CARE_PROVIDER_SITE_OTHER): Payer: Medicare Other | Admitting: Psychiatry

## 2018-05-12 VITALS — BP 107/74 | HR 76 | Ht 67.0 in | Wt 210.0 lb

## 2018-05-12 DIAGNOSIS — Z811 Family history of alcohol abuse and dependence: Secondary | ICD-10-CM | POA: Diagnosis not present

## 2018-05-12 DIAGNOSIS — G4700 Insomnia, unspecified: Secondary | ICD-10-CM

## 2018-05-12 DIAGNOSIS — F419 Anxiety disorder, unspecified: Secondary | ICD-10-CM

## 2018-05-12 DIAGNOSIS — Z736 Limitation of activities due to disability: Secondary | ICD-10-CM | POA: Diagnosis not present

## 2018-05-12 DIAGNOSIS — F319 Bipolar disorder, unspecified: Secondary | ICD-10-CM

## 2018-05-12 DIAGNOSIS — Z818 Family history of other mental and behavioral disorders: Secondary | ICD-10-CM | POA: Diagnosis not present

## 2018-05-12 DIAGNOSIS — F17210 Nicotine dependence, cigarettes, uncomplicated: Secondary | ICD-10-CM

## 2018-05-12 DIAGNOSIS — R450 Nervousness: Secondary | ICD-10-CM

## 2018-05-12 DIAGNOSIS — Z813 Family history of other psychoactive substance abuse and dependence: Secondary | ICD-10-CM

## 2018-05-12 MED ORDER — BUSPIRONE HCL 30 MG PO TABS: ORAL_TABLET | ORAL | 2 refills | Status: DC

## 2018-05-12 MED ORDER — ZOLPIDEM TARTRATE 10 MG PO TABS: 10.0000 mg | ORAL_TABLET | Freq: Every day | ORAL | 2 refills | Status: DC

## 2018-05-12 MED ORDER — BUPROPION HCL ER (SR) 150 MG PO TB12: 150.0000 mg | ORAL_TABLET | Freq: Three times a day (TID) | ORAL | 3 refills | Status: DC

## 2018-05-12 MED ORDER — ALPRAZOLAM 2 MG PO TABS: 2.0000 mg | ORAL_TABLET | Freq: Three times a day (TID) | ORAL | 2 refills | Status: DC

## 2018-05-12 MED ORDER — CARBAMAZEPINE ER 200 MG PO TB12: ORAL_TABLET | ORAL | 2 refills | Status: DC

## 2018-05-12 MED ORDER — LURASIDONE HCL 120 MG PO TABS: 120.0000 mg | ORAL_TABLET | Freq: Every day | ORAL | 2 refills | Status: DC

## 2018-05-12 NOTE — Patient Instructions (Signed)
Try melatonin 5 to 10 mg at bedtime 

## 2018-05-12 NOTE — Progress Notes (Signed)
BH MD/PA/NP OP Progress Note  05/12/2018 8:29 AM Edward Lutz  MRN:  [id number]  Chief Complaint:  Chief Complaint    Depression; Anxiety; Follow-up     HPI: This patient is a 37 year old married white male who lives with his wife, a daughter 22 years old and a boy 41 years old in [place].  His wife off so have custody of their 102-year-old nephew. he is on disability.  The patient states that he has a history of both substance abuse and bipolar disorder. About 10 years ago he was heavily using crack cocaine and marijuana and alcohol. He developed severe mood issues and was diagnosed as bipolar. He was hospitalized twice in 2009 at [facility] for suicide attempts. He he still has significant problems with short-term memory and temper but he feels he is more stable and he's ever been. He thinks his current combination of medicines has been very helpful. His mood is stable and he is able to take care of his family while his wife works and he is sleeping well.  He and his wife turn for follow-up after 3 months.  He states for the most part he is doing okay he denies significant depression or suicidal ideation.  Still has bouts of anxiety and is not sure why.  This week and his mother was visiting the area and he did not want to see her and this brought on some anxiety.  He sometimes sleeps well and other nights he wakes up every hour.  Again he does not know why.  He is on Ambien 10 mg at bedtime.  I suggested adding melatonin and if this does not work we can try something else.  Overall he feels like the medicines are helping his anxiety and mood and he is much less irritable than he used to be  Visit Diagnosis:    ICD-10-CM   1. Bipolar 1 disorder (HCC) F31.9     Past Psychiatric History: Past admissions in 2009 for suicide attempts  Past Medical History:  Past Medical History:  Diagnosis Date  . Back pain   . Bipolar disorder (HCC)   . HTN (hypertension)   . OSA  (obstructive sleep apnea)     Past Surgical History:  Procedure Laterality Date  . RHINOPLASTY    . TONSILLECTOMY      Family Psychiatric History: See below  Family History:  Family History  Problem Relation Age of Onset  . Bipolar disorder Mother   . Drug abuse Mother   . Anxiety disorder Mother   . Paranoid behavior Mother   . Physical abuse Mother   . Bipolar disorder Father   . Alcohol abuse Father   . Drug abuse Father   . Anxiety disorder Father   . Paranoid behavior Father   . Physical abuse Father   . Anxiety disorder Sister   . Depression Sister   . ADD / ADHD Daughter   . Dementia Neg Hx   . OCD Neg Hx   . Schizophrenia Neg Hx   . Seizures Neg Hx   . Sexual abuse Neg Hx     Social History:  Social History   Socioeconomic History  . Marital status: Married    Spouse name: Not on file  . Number of children: Not on file  . Years of education: Not on file  . Highest education level: Not on file  Occupational History  . Not on file  Social Needs  . Physicist, medical  strain: Not on file  . Food insecurity:    Worry: Not on file    Inability: Not on file  . Transportation needs:    Medical: Not on file    Non-medical: Not on file  Tobacco Use  . Smoking status: Current Every Day Smoker    Packs/day: 1.00    Years: 15.00    Pack years: 15.00    Types: Cigarettes  . Smokeless tobacco: Never Used  . Tobacco comment: 16-20 cigarettes a day as of 06/08/2013  Substance and Sexual Activity  . Alcohol use: Yes    Alcohol/week: 3.0 oz    Types: 5 Cans of beer per week  . Drug use: No  . Sexual activity: Yes  Lifestyle  . Physical activity:    Days per week: Not on file    Minutes per session: Not on file  . Stress: Not on file  Relationships  . Social connections:    Talks on phone: Not on file    Gets together: Not on file    Attends religious service: Not on file    Active member of club or organization: Not on file    Attends meetings of  clubs or organizations: Not on file    Relationship status: Not on file  Other Topics Concern  . Not on file  Social History Narrative  . Not on file    Allergies:  Allergies  Allergen Reactions  . Penicillins Rash    Has patient had a PCN reaction causing immediate rash, facial/tongue/throat swelling, SOB or lightheadedness with hypotension: Yes Has patient had a PCN reaction causing severe rash involving mucus membranes or skin necrosis: No Has patient had a PCN reaction that required hospitalization No Has patient had a PCN reaction occurring within the last 10 years: No If all of the above answers are "NO", then may proceed with Cephalosporin use.     Metabolic Disorder Labs: Lab Results  Component Value Date   HGBA1C 5.6 12/30/2012   MPG 114 12/30/2012   No results found for: PROLACTIN No results found for: CHOL, TRIG, HDL, CHOLHDL, VLDL, LDLCALC No results found for: TSH  Therapeutic Level Labs: Lab Results  Component Value Date   LITHIUM 1.05 01/21/2012   LITHIUM 0.97 10/24/2011   Lab Results  Component Value Date   VALPROATE 60.5 06/16/2008   No components found for:  CBMZ  Current Medications: Current Outpatient Medications  Medication Sig Dispense Refill  . alprazolam (XANAX) 2 MG tablet Take 1 tablet (2 mg total) by mouth 3 (three) times daily. 90 tablet 2  . amLODipine (NORVASC) 10 MG tablet Take 5 mg by mouth at bedtime.     Marland Kitchen buPROPion (WELLBUTRIN SR) 150 MG 12 hr tablet Take 1 tablet (150 mg total) by mouth 3 (three) times daily. 90 tablet 3  . busPIRone (BUSPAR) 30 MG tablet TAKE (1) TABLET BY MOUTH (3) TIMES DAILY. 90 tablet 2  . carbamazepine (TEGRETOL XR) 200 MG 12 hr tablet TAKE 7 TABLETS BY MOUTH AT BEDTIME. 210 tablet 2  . levETIRAcetam (KEPPRA) 500 MG tablet Take 1,000 mg by mouth 2 (two) times daily.     Marland Kitchen lisinopril-hydrochlorothiazide (PRINZIDE,ZESTORETIC) 20-12.5 MG tablet Take 1 tablet by mouth daily.     . Lurasidone HCl 120 MG TABS  Take 1 tablet (120 mg total) by mouth daily with supper. 90 tablet 2  . minocycline (MINOCIN,DYNACIN) 100 MG capsule Take 100 mg by mouth daily.     . Morphine-Naltrexone (EMBEDA)  30-1.2 MG CPCR Take 1 capsule by mouth daily.     Marland Kitchen oxyCODONE-acetaminophen (PERCOCET) 10-325 MG per tablet Take 1 tablet by mouth every 4 (four) hours as needed for pain.    . tamsulosin (FLOMAX) 0.4 MG CAPS capsule Take 0.4 mg by mouth daily.    Marland Kitchen tiZANidine (ZANAFLEX) 4 MG tablet Take 4 mg by mouth 3 (three) times daily.    Marland Kitchen tretinoin (RETIN-A) 0.05 % cream Apply 1 application topically 2 (two) times daily.    Marland Kitchen zolpidem (AMBIEN) 10 MG tablet Take 1 tablet (10 mg total) by mouth at bedtime. 30 tablet 2   No current facility-administered medications for this visit.      Musculoskeletal: Strength & Muscle Tone: within normal limits Gait & Station: normal Patient leans: N/A  Psychiatric Specialty Exam: Review of Systems  Musculoskeletal: Positive for back pain.  Psychiatric/Behavioral: The patient is nervous/anxious and has insomnia.   All other systems reviewed and are negative.   Blood pressure 107/74, pulse 76, height  (1.702 m), weight 210 lb (95.3 kg), SpO2 99 %.Body mass index is 32.89 kg/m.  General Appearance: Casual and Fairly Groomed  Eye Contact:  Good  Speech:  Clear and Coherent  Volume:  Normal  Mood:  Anxious  Affect:  Congruent  Thought Process:  Goal Directed  Orientation:  Full (Time, Place, and Person)  Thought Content: Rumination   Suicidal Thoughts:  No  Homicidal Thoughts:  No  Memory:  Immediate;   Good Recent;   Good Remote;   Fair  Judgement:  Fair  Insight:  Fair  Psychomotor Activity:  Normal  Concentration:  Concentration: Good and Attention Span: Good  Recall:  Good  Fund of Knowledge: Good  Language: Good  Akathisia:  No  Handed:  Right  AIMS (if indicated): not done  Assets:  Communication Skills Desire for Improvement Resilience Social  Support Talents/Skills  ADL's:  Intact  Cognition: WNL  Sleep:  Poor   Screenings:   Assessment and Plan: This patient is a 37 year old male with a history of bipolar disorder and significant anxiety as well as insomnia.  He is not sleeping as well as he would like.  For now we will continue Ambien 10 mg at bedtime and add melatonin 5 to 10 mg with it.  If this does not help we will have to change medication.  He will continue Xanax 2 mg 3 times daily and BuSpar 30 mg 3 times daily for anxiety, Wellbutrin SR 150 mg 3 times daily for depression, Tegretol-XR 200 mg - 7 tablets at bedtime, and lurasidone 120 mg daily with supper for mood stabilization.  He will return to see me in 3 months   [name], MD 05/12/2018, 8:29 AM

## 2018-06-01 DIAGNOSIS — M545 Low back pain: Secondary | ICD-10-CM | POA: Diagnosis not present

## 2018-06-01 DIAGNOSIS — Z79891 Long term (current) use of opiate analgesic: Secondary | ICD-10-CM | POA: Diagnosis not present

## 2018-06-01 DIAGNOSIS — G603 Idiopathic progressive neuropathy: Secondary | ICD-10-CM | POA: Diagnosis not present

## 2018-06-01 DIAGNOSIS — I10 Essential (primary) hypertension: Secondary | ICD-10-CM | POA: Diagnosis not present

## 2018-08-04 DIAGNOSIS — I10 Essential (primary) hypertension: Secondary | ICD-10-CM | POA: Diagnosis not present

## 2018-08-04 DIAGNOSIS — Z79891 Long term (current) use of opiate analgesic: Secondary | ICD-10-CM | POA: Diagnosis not present

## 2018-08-04 DIAGNOSIS — M545 Low back pain: Secondary | ICD-10-CM | POA: Diagnosis not present

## 2018-08-04 DIAGNOSIS — G603 Idiopathic progressive neuropathy: Secondary | ICD-10-CM | POA: Diagnosis not present

## 2018-08-12 ENCOUNTER — Encounter (HOSPITAL_COMMUNITY): Payer: Self-pay | Admitting: Psychiatry

## 2018-08-12 ENCOUNTER — Ambulatory Visit (INDEPENDENT_AMBULATORY_CARE_PROVIDER_SITE_OTHER): Payer: Medicare Other | Admitting: Psychiatry

## 2018-08-12 VITALS — BP 108/70 | HR 74 | Ht 67.0 in | Wt 215.0 lb

## 2018-08-12 DIAGNOSIS — F319 Bipolar disorder, unspecified: Secondary | ICD-10-CM

## 2018-08-12 MED ORDER — ZOLPIDEM TARTRATE 10 MG PO TABS: 10.0000 mg | ORAL_TABLET | Freq: Every day | ORAL | 2 refills | Status: DC

## 2018-08-12 MED ORDER — LURASIDONE HCL 120 MG PO TABS: 120.0000 mg | ORAL_TABLET | Freq: Every day | ORAL | 2 refills | Status: DC

## 2018-08-12 MED ORDER — CARBAMAZEPINE ER 200 MG PO TB12: ORAL_TABLET | ORAL | 2 refills | Status: DC

## 2018-08-12 MED ORDER — BUSPIRONE HCL 30 MG PO TABS: ORAL_TABLET | ORAL | 2 refills | Status: DC

## 2018-08-12 MED ORDER — ALPRAZOLAM 2 MG PO TABS: 2.0000 mg | ORAL_TABLET | Freq: Three times a day (TID) | ORAL | 2 refills | Status: DC

## 2018-08-12 MED ORDER — BUPROPION HCL ER (SR) 150 MG PO TB12: 150.0000 mg | ORAL_TABLET | Freq: Three times a day (TID) | ORAL | 3 refills | Status: DC

## 2018-08-12 NOTE — Progress Notes (Signed)
BH MD/PA/NP OP Progress Note  08/12/2018 8:26 AM Edward Lutz  MRN:  [id number]  Chief Complaint:  Chief Complaint    Depression; Anxiety; Manic Behavior; Follow-up     HPI: This patient is a 37 year old married white male who lives with his wife, a daughter 74years old and a boy 26years old in [place].  His wife off so have custody of their 49-year-old nephew. he is on disability.  The patient states that he has a history of both substance abuse and bipolar disorder. About 10 years ago he was heavily using crack cocaine and marijuana and alcohol. He developed severe mood issues and was diagnosed as bipolar. He was hospitalized twice in 2009 at [facility] for suicide attempts. He he still has significant problems with short-term memory and temper but he feels he is more stable and he's ever been. He thinks his current combination of medicines has been very helpful. His mood is stable and he is able to take care of his family while his wife works and he is sleeping well.  The patient wife return after 3 months.  He states that he has been stable.  He still has nights where he does not sleep all that well and gets up several times but other nights he sleeps well.  His mood has been stable on his current medications.  He denies suicidal ideation.  He is still doing all the things he needs to do at home.  He denies serious depression or manic symptoms and his anxiety is under fairly good control.   Visit Diagnosis:    ICD-10-CM   1. Bipolar 1 disorder (HCC) F31.9     Past Psychiatric History: Past admissions in 2009 for suicide attempts  Past Medical History:  Past Medical History:  Diagnosis Date  . Back pain   . Bipolar disorder (HCC)   . HTN (hypertension)   . OSA (obstructive sleep apnea)     Past Surgical History:  Procedure Laterality Date  . RHINOPLASTY    . TONSILLECTOMY      Family Psychiatric History: See below  Family History:  Family History   Problem Relation Age of Onset  . Bipolar disorder Mother   . Drug abuse Mother   . Anxiety disorder Mother   . Paranoid behavior Mother   . Physical abuse Mother   . Bipolar disorder Father   . Alcohol abuse Father   . Drug abuse Father   . Anxiety disorder Father   . Paranoid behavior Father   . Physical abuse Father   . Anxiety disorder Sister   . Depression Sister   . ADD / ADHD Daughter   . Dementia Neg Hx   . OCD Neg Hx   . Schizophrenia Neg Hx   . Seizures Neg Hx   . Sexual abuse Neg Hx     Social History:  Social History   Socioeconomic History  . Marital status: Married    Spouse name: Not on file  . Number of children: Not on file  . Years of education: Not on file  . Highest education level: Not on file  Occupational History  . Not on file  Social Needs  . Financial resource strain: Not on file  . Food insecurity:    Worry: Not on file    Inability: Not on file  . Transportation needs:    Medical: Not on file    Non-medical: Not on file  Tobacco Use  . Smoking status:  Current Every Day Smoker    Packs/day: 1.00    Years: 15.00    Pack years: 15.00    Types: Cigarettes  . Smokeless tobacco: Never Used  . Tobacco comment: 16-20 cigarettes a day as of 06/08/2013  Substance and Sexual Activity  . Alcohol use: Yes    Alcohol/week: 5.0 standard drinks    Types: 5 Cans of beer per week  . Drug use: No  . Sexual activity: Yes  Lifestyle  . Physical activity:    Days per week: Not on file    Minutes per session: Not on file  . Stress: Not on file  Relationships  . Social connections:    Talks on phone: Not on file    Gets together: Not on file    Attends religious service: Not on file    Active member of club or organization: Not on file    Attends meetings of clubs or organizations: Not on file    Relationship status: Not on file  Other Topics Concern  . Not on file  Social History Narrative  . Not on file    Allergies:  Allergies   Allergen Reactions  . Penicillins Rash    Has patient had a PCN reaction causing immediate rash, facial/tongue/throat swelling, SOB or lightheadedness with hypotension: Yes Has patient had a PCN reaction causing severe rash involving mucus membranes or skin necrosis: No Has patient had a PCN reaction that required hospitalization No Has patient had a PCN reaction occurring within the last 10 years: No If all of the above answers are "NO", then may proceed with Cephalosporin use.     Metabolic Disorder Labs: Lab Results  Component Value Date   HGBA1C 5.6 12/30/2012   MPG 114 12/30/2012   No results found for: PROLACTIN No results found for: CHOL, TRIG, HDL, CHOLHDL, VLDL, LDLCALC No results found for: TSH  Therapeutic Level Labs: Lab Results  Component Value Date   LITHIUM 1.05 01/21/2012   LITHIUM 0.97 10/24/2011   Lab Results  Component Value Date   VALPROATE 60.5 06/16/2008   No components found for:  CBMZ  Current Medications: Current Outpatient Medications  Medication Sig Dispense Refill  . alprazolam (XANAX) 2 MG tablet Take 1 tablet (2 mg total) by mouth 3 (three) times daily. 90 tablet 2  . amLODipine (NORVASC) 10 MG tablet Take 5 mg by mouth at bedtime.     Marland Kitchen buPROPion (WELLBUTRIN SR) 150 MG 12 hr tablet Take 1 tablet (150 mg total) by mouth 3 (three) times daily. 90 tablet 3  . busPIRone (BUSPAR) 30 MG tablet TAKE (1) TABLET BY MOUTH (3) TIMES DAILY. 90 tablet 2  . carbamazepine (TEGRETOL XR) 200 MG 12 hr tablet TAKE 7 TABLETS BY MOUTH AT BEDTIME. 210 tablet 2  . levETIRAcetam (KEPPRA) 500 MG tablet Take 1,000 mg by mouth 2 (two) times daily.     Marland Kitchen lisinopril-hydrochlorothiazide (PRINZIDE,ZESTORETIC) 20-12.5 MG tablet Take 1 tablet by mouth daily.     . Lurasidone HCl 120 MG TABS Take 1 tablet (120 mg total) by mouth daily with supper. 90 tablet 2  . minocycline (MINOCIN,DYNACIN) 100 MG capsule Take 100 mg by mouth daily.     . Morphine-Naltrexone (EMBEDA)  30-1.2 MG CPCR Take 1 capsule by mouth daily.     Marland Kitchen oxyCODONE-acetaminophen (PERCOCET) 10-325 MG per tablet Take 1 tablet by mouth every 4 (four) hours as needed for pain.    . tamsulosin (FLOMAX) 0.4 MG CAPS capsule Take 0.4 mg  by mouth daily.    Marland Kitchen. tiZANidine (ZANAFLEX) 4 MG tablet Take 4 mg by mouth 3 (three) times daily.    Marland Kitchen. tretinoin (RETIN-A) 0.05 % cream Apply 1 application topically 2 (two) times daily.    Marland Kitchen. zolpidem (AMBIEN) 10 MG tablet Take 1 tablet (10 mg total) by mouth at bedtime. 30 tablet 2   No current facility-administered medications for this visit.      Musculoskeletal: Strength & Muscle Tone: within normal limits Gait & Station: normal Patient leans: N/A  Psychiatric Specialty Exam: Review of Systems  Psychiatric/Behavioral: The patient is nervous/anxious.   All other systems reviewed and are negative.   Blood pressure 108/70, pulse 74, height 5\' 7"  (1.702 m), weight 215 lb (97.5 kg), SpO2 98 %.Body mass index is 33.67 kg/m.  General Appearance: Casual, Neat and Well Groomed  Eye Contact:  Good  Speech:  Clear and Coherent  Volume:  Normal  Mood:  Anxious  Affect:  Congruent  Thought Process:  Goal Directed  Orientation:  Full (Time, Place, and Person)  Thought Content: Rumination   Suicidal Thoughts:  No  Homicidal Thoughts:  No  Memory:  Immediate;   Good Recent;   Good Remote;   Fair  Judgement:  Fair  Insight:  Fair  Psychomotor Activity:  Normal  Concentration:  Concentration: Fair and Attention Span: Fair  Recall:  Good  Fund of Knowledge: Good  Language: Good  Akathisia:  No  Handed:  Right  AIMS (if indicated): not done  Assets:  Communication Skills Desire for Improvement Physical Health Resilience Social Support Talents/Skills  ADL's:  Intact  Cognition: WNL  Sleep:  Fair   Screenings:   Assessment and Plan: This patient is a 37 year old male with a history of bipolar disorder and a past history of substance abuse.  He  continues to be stable on his current medications.  The patient will continue BuSpar 30 mg 3 times daily for anxiety, Xanax 2 mg 3 times daily also for anxiety, Wellbutrin SR 150 mg 3 times daily for depression, lurasidone 120 mg daily for mood stabilization as well as Tegretol 1400 mg at bedtime.  He will also continue Ambien 10 mg at bedtime for sleep.  He will return to see me in 3 months   [name], MD 08/12/2018, 8:26 AM

## 2018-09-23 DIAGNOSIS — I10 Essential (primary) hypertension: Secondary | ICD-10-CM | POA: Diagnosis not present

## 2018-09-23 DIAGNOSIS — E782 Mixed hyperlipidemia: Secondary | ICD-10-CM | POA: Diagnosis not present

## 2018-09-23 DIAGNOSIS — Z0000 Encounter for general adult medical examination without abnormal findings: Secondary | ICD-10-CM | POA: Diagnosis not present

## 2018-09-23 DIAGNOSIS — R7301 Impaired fasting glucose: Secondary | ICD-10-CM | POA: Diagnosis not present

## 2018-09-24 DIAGNOSIS — I10 Essential (primary) hypertension: Secondary | ICD-10-CM | POA: Diagnosis not present

## 2018-09-24 DIAGNOSIS — Z0000 Encounter for general adult medical examination without abnormal findings: Secondary | ICD-10-CM | POA: Diagnosis not present

## 2018-09-24 DIAGNOSIS — R7301 Impaired fasting glucose: Secondary | ICD-10-CM | POA: Diagnosis not present

## 2018-09-24 DIAGNOSIS — E782 Mixed hyperlipidemia: Secondary | ICD-10-CM | POA: Diagnosis not present

## 2018-09-24 DIAGNOSIS — Z23 Encounter for immunization: Secondary | ICD-10-CM | POA: Diagnosis not present

## 2018-11-02 DIAGNOSIS — G603 Idiopathic progressive neuropathy: Secondary | ICD-10-CM | POA: Diagnosis not present

## 2018-11-02 DIAGNOSIS — M545 Low back pain: Secondary | ICD-10-CM | POA: Diagnosis not present

## 2018-11-02 DIAGNOSIS — Z79891 Long term (current) use of opiate analgesic: Secondary | ICD-10-CM | POA: Diagnosis not present

## 2018-11-02 DIAGNOSIS — I10 Essential (primary) hypertension: Secondary | ICD-10-CM | POA: Diagnosis not present

## 2018-11-16 ENCOUNTER — Ambulatory Visit (INDEPENDENT_AMBULATORY_CARE_PROVIDER_SITE_OTHER): Payer: Medicare Other | Admitting: Psychiatry

## 2018-11-16 ENCOUNTER — Encounter (HOSPITAL_COMMUNITY): Payer: Self-pay | Admitting: Psychiatry

## 2018-11-16 VITALS — BP 118/79 | HR 78 | Ht 67.0 in | Wt 217.0 lb

## 2018-11-16 DIAGNOSIS — F319 Bipolar disorder, unspecified: Secondary | ICD-10-CM

## 2018-11-16 MED ORDER — LURASIDONE HCL 120 MG PO TABS: 120.0000 mg | ORAL_TABLET | Freq: Every day | ORAL | 2 refills | Status: DC

## 2018-11-16 MED ORDER — CARBAMAZEPINE ER 200 MG PO TB12: ORAL_TABLET | ORAL | 2 refills | Status: DC

## 2018-11-16 MED ORDER — ALPRAZOLAM 2 MG PO TABS: 2.0000 mg | ORAL_TABLET | Freq: Three times a day (TID) | ORAL | 2 refills | Status: DC

## 2018-11-16 MED ORDER — ZOLPIDEM TARTRATE 10 MG PO TABS: 10.0000 mg | ORAL_TABLET | Freq: Every day | ORAL | 2 refills | Status: DC

## 2018-11-16 MED ORDER — BUSPIRONE HCL 30 MG PO TABS: ORAL_TABLET | ORAL | 2 refills | Status: DC

## 2018-11-16 MED ORDER — BUPROPION HCL ER (SR) 150 MG PO TB12: 150.0000 mg | ORAL_TABLET | Freq: Three times a day (TID) | ORAL | 3 refills | Status: DC

## 2018-11-16 NOTE — Progress Notes (Signed)
BH MD/PA/NP OP Progress Note  11/16/2018 8:54 AM Edward Lutz  MRN:  [id number]  Chief Complaint:  Chief Complaint    Depression; Anxiety; Manic Behavior; Follow-up     HPI: This patient is a 37 year old married white male who lives with his wife, a daughter 7382years old and a boy 9749years old in [place].His wife off so have custody of their 38-year-old nephew.he is on disability.  The patient states that he has a history of both substance abuse and bipolar disorder. About 10 years ago he was heavily using crack cocaine and marijuana and alcohol. He developed severe mood issues and was diagnosed as bipolar. He was hospitalized twice in 2009 at [facility] for suicide attempts. He he still has significant problems with short-term memory and temper but he feels he is more stable and he's ever been. He thinks his current combination of medicines has been very helpful. His mood is stable and he is able to take care of his family while his wife works and he is sleeping well.  Patient returns after 3 months.  He states he has been doing okay for the most part.  He still has ups and downs in mood.  His sister is come back into the picture and wants to get her 38-year-old son back.  The patient and his wife have had custody of this child for 4 years because of the sister's substance abuse.  She is clean now but he does not feel that she is ready to take the child back.  He is hoping it will go into a court battle.  Most of the time he feels fairly good but he does have days that are very sad.  He is on a lot of medication and he states that he is learning to cope with this for the most part.  He denies being suicidal and he seems pretty positive.  His sleep is variable but sometimes he wakes up several times at night and other nights he sleeps quite well. Visit Diagnosis:    ICD-10-CM   1. Bipolar 1 disorder (HCC) F31.9     Past Psychiatric History: Past admissions in 2009 for  suicide attempts  Past Medical History:  Past Medical History:  Diagnosis Date  . Back pain   . Bipolar disorder (HCC)   . HTN (hypertension)   . OSA (obstructive sleep apnea)     Past Surgical History:  Procedure Laterality Date  . RHINOPLASTY    . TONSILLECTOMY      Family Psychiatric History: See below  Family History:  Family History  Problem Relation Age of Onset  . Bipolar disorder Mother   . Drug abuse Mother   . Anxiety disorder Mother   . Paranoid behavior Mother   . Physical abuse Mother   . Bipolar disorder Father   . Alcohol abuse Father   . Drug abuse Father   . Anxiety disorder Father   . Paranoid behavior Father   . Physical abuse Father   . Anxiety disorder Sister   . Depression Sister   . ADD / ADHD Daughter   . Dementia Neg Hx   . OCD Neg Hx   . Schizophrenia Neg Hx   . Seizures Neg Hx   . Sexual abuse Neg Hx     Social History:  Social History   Socioeconomic History  . Marital status: Married    Spouse name: Not on file  . Number of children: Not on file  .  Years of education: Not on file  . Highest education level: Not on file  Occupational History  . Not on file  Social Needs  . Financial resource strain: Not on file  . Food insecurity:    Worry: Not on file    Inability: Not on file  . Transportation needs:    Medical: Not on file    Non-medical: Not on file  Tobacco Use  . Smoking status: Current Every Day Smoker    Packs/day: 1.00    Years: 15.00    Pack years: 15.00    Types: Cigarettes  . Smokeless tobacco: Never Used  . Tobacco comment: 16-20 cigarettes a day as of 06/08/2013  Substance and Sexual Activity  . Alcohol use: Yes    Alcohol/week: 5.0 standard drinks    Types: 5 Cans of beer per week  . Drug use: No  . Sexual activity: Yes  Lifestyle  . Physical activity:    Days per week: Not on file    Minutes per session: Not on file  . Stress: Not on file  Relationships  . Social connections:    Talks on  phone: Not on file    Gets together: Not on file    Attends religious service: Not on file    Active member of club or organization: Not on file    Attends meetings of clubs or organizations: Not on file    Relationship status: Not on file  Other Topics Concern  . Not on file  Social History Narrative  . Not on file    Allergies:  Allergies  Allergen Reactions  . Penicillins Rash    Has patient had a PCN reaction causing immediate rash, facial/tongue/throat swelling, SOB or lightheadedness with hypotension: Yes Has patient had a PCN reaction causing severe rash involving mucus membranes or skin necrosis: No Has patient had a PCN reaction that required hospitalization No Has patient had a PCN reaction occurring within the last 10 years: No If all of the above answers are "NO", then may proceed with Cephalosporin use.     Metabolic Disorder Labs: Lab Results  Component Value Date   HGBA1C 5.6 12/30/2012   MPG 114 12/30/2012   No results found for: PROLACTIN No results found for: CHOL, TRIG, HDL, CHOLHDL, VLDL, LDLCALC No results found for: TSH  Therapeutic Level Labs: Lab Results  Component Value Date   LITHIUM 1.05 01/21/2012   LITHIUM 0.97 10/24/2011   Lab Results  Component Value Date   VALPROATE 60.5 06/16/2008   No components found for:  CBMZ  Current Medications: Current Outpatient Medications  Medication Sig Dispense Refill  . alprazolam (XANAX) 2 MG tablet Take 1 tablet (2 mg total) by mouth 3 (three) times daily. 90 tablet 2  . amLODipine (NORVASC) 10 MG tablet Take 5 mg by mouth at bedtime.     Marland Kitchen buPROPion (WELLBUTRIN SR) 150 MG 12 hr tablet Take 1 tablet (150 mg total) by mouth 3 (three) times daily. 90 tablet 3  . busPIRone (BUSPAR) 30 MG tablet TAKE (1) TABLET BY MOUTH (3) TIMES DAILY. 90 tablet 2  . carbamazepine (TEGRETOL XR) 200 MG 12 hr tablet TAKE 7 TABLETS BY MOUTH AT BEDTIME. 210 tablet 2  . levETIRAcetam (KEPPRA) 500 MG tablet Take 1,000 mg by  mouth 2 (two) times daily.     Marland Kitchen lisinopril-hydrochlorothiazide (PRINZIDE,ZESTORETIC) 20-12.5 MG tablet Take 1 tablet by mouth daily.     . Lurasidone HCl 120 MG TABS Take 1 tablet (120  mg total) by mouth daily with supper. 90 tablet 2  . minocycline (MINOCIN,DYNACIN) 100 MG capsule Take 100 mg by mouth daily.     . Morphine-Naltrexone (EMBEDA) 30-1.2 MG CPCR Take 1 capsule by mouth daily.     Marland Kitchen oxyCODONE-acetaminophen (PERCOCET) 10-325 MG per tablet Take 1 tablet by mouth every 4 (four) hours as needed for pain.    . tamsulosin (FLOMAX) 0.4 MG CAPS capsule Take 0.4 mg by mouth daily.    Marland Kitchen tiZANidine (ZANAFLEX) 4 MG tablet Take 4 mg by mouth 3 (three) times daily.    Marland Kitchen tretinoin (RETIN-A) 0.05 % cream Apply 1 application topically 2 (two) times daily.    Marland Kitchen zolpidem (AMBIEN) 10 MG tablet Take 1 tablet (10 mg total) by mouth at bedtime. 30 tablet 2   No current facility-administered medications for this visit.      Musculoskeletal: Strength & Muscle Tone: within normal limits Gait & Station: normal Patient leans: N/A  Psychiatric Specialty Exam: Review of Systems  Musculoskeletal: Positive for back pain.  All other systems reviewed and are negative.   Blood pressure 118/79, pulse 78, height 5\' 7"  (1.702 m), weight 217 lb (98.4 kg), SpO2 99 %.Body mass index is 33.99 kg/m.  General Appearance: Casual and Fairly Groomed  Eye Contact:  Good  Speech:  Clear and Coherent  Volume:  Normal  Mood:  Anxious  Affect:  Appropriate and Congruent  Thought Process:  Goal Directed  Orientation:  Full (Time, Place, and Person)  Thought Content: Rumination   Suicidal Thoughts:  No  Homicidal Thoughts:  No  Memory:  Immediate;   Good Recent;   Good Remote;   Fair  Judgement:  Fair  Insight:  Fair  Psychomotor Activity:  Normal  Concentration:  Concentration: Fair and Attention Span: Fair  Recall:  Good  Fund of Knowledge: Fair  Language: Good  Akathisia:  No  Handed:  Right  AIMS (if  indicated): not done  Assets:  Communication Skills Desire for Improvement Resilience Social Support Talents/Skills  ADL's:  Intact  Cognition: WNL  Sleep:  Fair   Screenings:   Assessment and Plan: This patient is a 37 year old male with a history of bipolar disorder and polysubstance abuse.  He still drinks a bit but is not using other substances.  He feels that his mood is stable on his current regimen.  He will continue Ambien 10 mg at bedtime for sleep, Latuda 120 mg at supper for mood stabilization, Tegretol-XR 1400 mg at bedtime, BuSpar 30 mg times daily for anxiety, Wellbutrin SR 150 mg twice daily for depression and Xanax 2 mg 3 times daily for anxiety.  He will return to see me in 3 months   [name], MD 11/16/2018, 8:54 AM

## 2019-01-27 DIAGNOSIS — I10 Essential (primary) hypertension: Secondary | ICD-10-CM | POA: Diagnosis not present

## 2019-01-27 DIAGNOSIS — G603 Idiopathic progressive neuropathy: Secondary | ICD-10-CM | POA: Diagnosis not present

## 2019-01-27 DIAGNOSIS — M545 Low back pain: Secondary | ICD-10-CM | POA: Diagnosis not present

## 2019-01-27 DIAGNOSIS — Z79891 Long term (current) use of opiate analgesic: Secondary | ICD-10-CM | POA: Diagnosis not present

## 2019-02-15 ENCOUNTER — Ambulatory Visit (INDEPENDENT_AMBULATORY_CARE_PROVIDER_SITE_OTHER): Payer: Medicare Other | Admitting: Psychiatry

## 2019-02-15 ENCOUNTER — Encounter (HOSPITAL_COMMUNITY): Payer: Self-pay | Admitting: Psychiatry

## 2019-02-15 VITALS — BP 129/80 | HR 76 | Ht 66.0 in | Wt 222.0 lb

## 2019-02-15 DIAGNOSIS — F319 Bipolar disorder, unspecified: Secondary | ICD-10-CM | POA: Diagnosis not present

## 2019-02-15 MED ORDER — BUSPIRONE HCL 30 MG PO TABS: ORAL_TABLET | ORAL | 2 refills | Status: DC

## 2019-02-15 MED ORDER — ZOLPIDEM TARTRATE 10 MG PO TABS: 10.0000 mg | ORAL_TABLET | Freq: Every day | ORAL | 2 refills | Status: DC

## 2019-02-15 MED ORDER — CARBAMAZEPINE ER 200 MG PO TB12: ORAL_TABLET | ORAL | 2 refills | Status: DC

## 2019-02-15 MED ORDER — LURASIDONE HCL 120 MG PO TABS: 120.0000 mg | ORAL_TABLET | Freq: Every day | ORAL | 2 refills | Status: DC

## 2019-02-15 MED ORDER — BUPROPION HCL ER (SR) 150 MG PO TB12: 150.0000 mg | ORAL_TABLET | Freq: Three times a day (TID) | ORAL | 3 refills | Status: DC

## 2019-02-15 MED ORDER — ALPRAZOLAM 2 MG PO TABS: 2.0000 mg | ORAL_TABLET | Freq: Three times a day (TID) | ORAL | 2 refills | Status: DC

## 2019-02-15 NOTE — Progress Notes (Signed)
BH MD/PA/NP OP Progress Note  02/15/2019 8:45 AM Edward Lutz  MRN:  [id number]  Chief Complaint:  Chief Complaint    Anxiety; Depression; Manic Behavior; Follow-up     HPI: This patient is a 38 year old married white male who lives with his wife, a daughter 69years old and a boy 79years old in [place].His wife off so have custody of their 10-year-old nephew.he is on disability.  The patient states that he has a history of both substance abuse and bipolar disorder. About 10 years ago he was heavily using crack cocaine and marijuana and alcohol. He developed severe mood issues and was diagnosed as bipolar. He was hospitalized twice in 2009 at [facility] for suicide attempts. He he still has significant problems with short-term memory and temper but he feels he is more stable and he's ever been. He thinks his current combination of medicines has been very helpful. His mood is stable and he is able to take care of his family while his wife works and he is sleeping well.  The patient and wife return for follow-up after 3 months.  The wife states she is that she is worried that her husband may be more depressed.  He seems "obsessed with sex."  He wants to have sex with her at least twice a day.  In the past he was obsessed that she was having an affair while she was at work even though this was not at all true.  It seems to me this might be some sort of control issue.  She states "he gets angry and irritable when he cannot have it."  I encouraged him to listen to her needs and understand that she also has to have a say in their sexual relationship.  He claims he is not depressed or suicidal.  His sleep is variable but he states he gets enough sleep and is doing everything he needs to do to take care of the children.  He feels like he is "stable on his medications but he no longer feels comfortable around people other than his close nuclear family.  He denies any thoughts of harm  to self or others. Visit Diagnosis:    ICD-10-CM   1. Bipolar 1 disorder (HCC) F31.9     Past Psychiatric History: Past admissions in 2009 for suicide attempts  Past Medical History:  Past Medical History:  Diagnosis Date  . Back pain   . Bipolar disorder (HCC)   . HTN (hypertension)   . OSA (obstructive sleep apnea)     Past Surgical History:  Procedure Laterality Date  . RHINOPLASTY    . TONSILLECTOMY      Family Psychiatric History: see below  Family History:  Family History  Problem Relation Age of Onset  . Bipolar disorder Mother   . Drug abuse Mother   . Anxiety disorder Mother   . Paranoid behavior Mother   . Physical abuse Mother   . Bipolar disorder Father   . Alcohol abuse Father   . Drug abuse Father   . Anxiety disorder Father   . Paranoid behavior Father   . Physical abuse Father   . Anxiety disorder Sister   . Depression Sister   . ADD / ADHD Daughter   . Dementia Neg Hx   . OCD Neg Hx   . Schizophrenia Neg Hx   . Seizures Neg Hx   . Sexual abuse Neg Hx     Social History:  Social History  Socioeconomic History  . Marital status: Married    Spouse name: Not on file  . Number of children: Not on file  . Years of education: Not on file  . Highest education level: Not on file  Occupational History  . Not on file  Social Needs  . Financial resource strain: Not on file  . Food insecurity:    Worry: Not on file    Inability: Not on file  . Transportation needs:    Medical: Not on file    Non-medical: Not on file  Tobacco Use  . Smoking status: Current Every Day Smoker    Packs/day: 1.00    Years: 15.00    Pack years: 15.00    Types: Cigarettes  . Smokeless tobacco: Never Used  . Tobacco comment: 16-20 cigarettes a day as of 06/08/2013  Substance and Sexual Activity  . Alcohol use: Yes    Alcohol/week: 36.0 standard drinks    Types: 36 Cans of beer per week    Comment: Reports drinks 5-6 beers a day.   . Drug use: No  . Sexual  activity: Yes    Partners: Female    Birth control/protection: None  Lifestyle  . Physical activity:    Days per week: Not on file    Minutes per session: Not on file  . Stress: Not on file  Relationships  . Social connections:    Talks on phone: Not on file    Gets together: Not on file    Attends religious service: Not on file    Active member of club or organization: Not on file    Attends meetings of clubs or organizations: Not on file    Relationship status: Not on file  Other Topics Concern  . Not on file  Social History Narrative  . Not on file    Allergies:  Allergies  Allergen Reactions  . Penicillins Rash    Has patient had a PCN reaction causing immediate rash, facial/tongue/throat swelling, SOB or lightheadedness with hypotension: Yes Has patient had a PCN reaction causing severe rash involving mucus membranes or skin necrosis: No Has patient had a PCN reaction that required hospitalization No Has patient had a PCN reaction occurring within the last 10 years: No If all of the above answers are "NO", then may proceed with Cephalosporin use.     Metabolic Disorder Labs: Lab Results  Component Value Date   HGBA1C 5.6 12/30/2012   MPG 114 12/30/2012   No results found for: PROLACTIN No results found for: CHOL, TRIG, HDL, CHOLHDL, VLDL, LDLCALC No results found for: TSH  Therapeutic Level Labs: Lab Results  Component Value Date   LITHIUM 1.05 01/21/2012   LITHIUM 0.97 10/24/2011   Lab Results  Component Value Date   VALPROATE 60.5 06/16/2008   No components found for:  CBMZ  Current Medications: Current Outpatient Medications  Medication Sig Dispense Refill  . alprazolam (XANAX) 2 MG tablet Take 1 tablet (2 mg total) by mouth 3 (three) times daily. 90 tablet 2  . amLODipine (NORVASC) 10 MG tablet Take 5 mg by mouth at bedtime.     Marland Kitchen buPROPion (WELLBUTRIN SR) 150 MG 12 hr tablet Take 1 tablet (150 mg total) by mouth 3 (three) times daily. 90 tablet 3   . busPIRone (BUSPAR) 30 MG tablet TAKE (1) TABLET BY MOUTH (3) TIMES DAILY. 90 tablet 2  . carbamazepine (TEGRETOL XR) 200 MG 12 hr tablet TAKE 7 TABLETS BY MOUTH AT BEDTIME. 210 tablet  2  . clindamycin (CLEOCIN T) 1 % SWAB     . levETIRAcetam (KEPPRA) 500 MG tablet Take 1,000 mg by mouth 2 (two) times daily.     Marland Kitchen lisinopril-hydrochlorothiazide (PRINZIDE,ZESTORETIC) 20-12.5 MG tablet Take 1 tablet by mouth daily.     . Lurasidone HCl 120 MG TABS Take 1 tablet (120 mg total) by mouth daily with supper. 90 tablet 2  . morphine (MS CONTIN) 30 MG 12 hr tablet Take 30 mg by mouth 2 (two) times daily.    Marland Kitchen oxyCODONE-acetaminophen (PERCOCET) 10-325 MG per tablet Take 1 tablet by mouth every 4 (four) hours as needed for pain.    Marland Kitchen tiZANidine (ZANAFLEX) 4 MG tablet Take 4 mg by mouth 3 (three) times daily.    Marland Kitchen tretinoin (RETIN-A) 0.05 % cream Apply 1 application topically 2 (two) times daily.    . minocycline (MINOCIN,DYNACIN) 100 MG capsule Take 100 mg by mouth daily.     . tamsulosin (FLOMAX) 0.4 MG CAPS capsule Take 0.4 mg by mouth daily.    Marland Kitchen zolpidem (AMBIEN) 10 MG tablet Take 1 tablet (10 mg total) by mouth at bedtime for 30 days. 30 tablet 2   No current facility-administered medications for this visit.      Musculoskeletal: Strength & Muscle Tone: within normal limits Gait & Station: normal Patient leans: N/A  Psychiatric Specialty Exam: Review of Systems  Musculoskeletal: Positive for back pain.  All other systems reviewed and are negative.   Blood pressure 129/80, pulse 76, height  (1.676 m), weight 222 lb (100.7 kg).Body mass index is 35.83 kg/m.  General Appearance: Casual and Fairly Groomed  Eye Contact:  Good  Speech:  Clear and Coherent  Volume:  Normal  Mood:  Irritable  Affect:  Appropriate and Congruent  Thought Process:  Goal Directed  Orientation:  Full (Time, Place, and Person)  Thought Content: Rumination   Suicidal Thoughts:  No  Homicidal Thoughts:  No   Memory:  Immediate;   Good Recent;   Good Remote;   Good  Judgement:  Fair  Insight:  Fair  Psychomotor Activity:  Normal  Concentration:  Concentration: Good and Attention Span: Good  Recall:  Good  Fund of Knowledge: Fair  Language: Good  Akathisia:  No  Handed:  Right  AIMS (if indicated): not done  Assets:  Communication Skills Desire for Improvement Resilience Social Support Talents/Skills  ADL's:  Intact  Cognition: WNL  Sleep:  Good   Screenings:   Assessment and Plan: This patient is a 38 year old male with a remote history of substance abuse as well as a diagnosis of bipolar disorder.  He is stable on his current medications and is able to maintain his household duties but does not appear to be able to work in [employer] as he is still very anxious among people.  He will continue Xanax 2 mg 3 times daily as well as BuSpar 30 mg 3 times daily for anxiety, Wellbutrin SR 150 mg 3 times daily for depression and lurasidone 120 mg daily for mood stabilization as well as Ambien 10 mg at bedtime for sleep and Tegretol XR 1400 mg for mood stabilization.  He will return to see me in 3 months.  Counseling has been suggested to him many times but he always declines.   [name], MD 02/15/2019, 8:45 AM

## 2019-04-19 DIAGNOSIS — Z0000 Encounter for general adult medical examination without abnormal findings: Secondary | ICD-10-CM | POA: Diagnosis not present

## 2019-04-21 ENCOUNTER — Other Ambulatory Visit (HOSPITAL_COMMUNITY): Payer: Self-pay | Admitting: Psychiatry

## 2019-04-27 DIAGNOSIS — I10 Essential (primary) hypertension: Secondary | ICD-10-CM | POA: Diagnosis not present

## 2019-04-27 DIAGNOSIS — M545 Low back pain: Secondary | ICD-10-CM | POA: Diagnosis not present

## 2019-04-27 DIAGNOSIS — Z79891 Long term (current) use of opiate analgesic: Secondary | ICD-10-CM | POA: Diagnosis not present

## 2019-04-27 DIAGNOSIS — G603 Idiopathic progressive neuropathy: Secondary | ICD-10-CM | POA: Diagnosis not present

## 2019-05-18 ENCOUNTER — Other Ambulatory Visit: Payer: Self-pay

## 2019-05-18 ENCOUNTER — Other Ambulatory Visit (HOSPITAL_COMMUNITY): Payer: Self-pay | Admitting: Psychiatry

## 2019-05-18 ENCOUNTER — Ambulatory Visit (INDEPENDENT_AMBULATORY_CARE_PROVIDER_SITE_OTHER): Payer: Medicare Other | Admitting: Psychiatry

## 2019-05-18 ENCOUNTER — Encounter (HOSPITAL_COMMUNITY): Payer: Self-pay | Admitting: Psychiatry

## 2019-05-18 DIAGNOSIS — F319 Bipolar disorder, unspecified: Secondary | ICD-10-CM | POA: Diagnosis not present

## 2019-05-18 MED ORDER — CARBAMAZEPINE ER 200 MG PO TB12: ORAL_TABLET | ORAL | 2 refills | Status: DC

## 2019-05-18 MED ORDER — BUPROPION HCL ER (SR) 150 MG PO TB12: 150.0000 mg | ORAL_TABLET | Freq: Three times a day (TID) | ORAL | 3 refills | Status: DC

## 2019-05-18 MED ORDER — LURASIDONE HCL 120 MG PO TABS: 120.0000 mg | ORAL_TABLET | Freq: Every day | ORAL | 2 refills | Status: DC

## 2019-05-18 MED ORDER — ALPRAZOLAM 2 MG PO TABS: 2.0000 mg | ORAL_TABLET | Freq: Three times a day (TID) | ORAL | 2 refills | Status: DC

## 2019-05-18 MED ORDER — ZOLPIDEM TARTRATE ER 12.5 MG PO TBCR: 12.5000 mg | EXTENDED_RELEASE_TABLET | Freq: Every evening | ORAL | 2 refills | Status: DC | PRN

## 2019-05-18 MED ORDER — BUSPIRONE HCL 30 MG PO TABS: ORAL_TABLET | ORAL | 2 refills | Status: DC

## 2019-05-18 NOTE — Progress Notes (Signed)
Virtual Visit via Video Note  I connected with Edward Lutz on 05/18/19 at  8:20 AM EDT by a video enabled telemedicine application and verified that I am speaking with the correct person using two identifiers.   I discussed the limitations of evaluation and management by telemedicine and the availability of in person appointments. The patient expressed understanding and agreed to proceed.     I discussed the assessment and treatment plan with the patient. The patient was provided an opportunity to ask questions and all were answered. The patient agreed with the plan and demonstrated an understanding of the instructions.   The patient was advised to call back or seek an in-person evaluation if the symptoms worsen or if the condition fails to improve as anticipated.  I provided 15 minutes of non-face-to-face time during this encounter.   Edward Rudereborah Ross, MD  [facility] MD/PA/NP OP Progress Note  05/18/2019 8:47 AM Edward JockBryant E Lutz  MRN:  [id number]  Chief Complaint:  Chief Complaint    Depression; Anxiety; Follow-up; Manic Behavior     HPI: This patient is a 38 year old married white male who lives with his wife, a daughter 4590years old and a boy 6713years old in [place].His wife off so have custody of their 39-year-old nephew.he is on disability.  The patient states that he has a history of both substance abuse and bipolar disorder. About 10 years ago he was heavily using crack cocaine and marijuana and alcohol. He developed severe mood issues and was diagnosed as bipolar. He was hospitalized twice in 2009 at [facility] for suicide attempts. He he still has significant problems with short-term memory and temper but he feels he is more stable and he's ever been. He thinks his current combination of medicines has been very helpful. His mood is stable and he is able to take care of his family while his wife works and he is sleeping well.  The patient returns for follow-up  after 3 months.  He returns via telemedicine due to the coronavirus pandemic.  He states that he has been doing "about the same."  His mood is been fairly stable and his anxiety is under good control.  He has trouble staying asleep through the night and often wakes up repeatedly.  I suggested he switch from Ambien to Ambien CR and he agrees.  He states that he is getting along well with family members and has not had any altercations or blowups.  He denies any thoughts of self-harm or harm to others.  He states that the Xanax and BuSpar is working well to control his anxiety Visit Diagnosis:    ICD-10-CM   1. Bipolar 1 disorder (HCC) F31.9     Past Psychiatric History: Past admissions in 2009 for suicide attempts  Past Medical History:  Past Medical History:  Diagnosis Date  . Back pain   . Bipolar disorder (HCC)   . HTN (hypertension)   . OSA (obstructive sleep apnea)     Past Surgical History:  Procedure Laterality Date  . RHINOPLASTY    . TONSILLECTOMY      Family Psychiatric History: See below  Family History:  Family History  Problem Relation Age of Onset  . Bipolar disorder Mother   . Drug abuse Mother   . Anxiety disorder Mother   . Paranoid behavior Mother   . Physical abuse Mother   . Bipolar disorder Father   . Alcohol abuse Father   . Drug abuse Father   .  Anxiety disorder Father   . Paranoid behavior Father   . Physical abuse Father   . Anxiety disorder Sister   . Depression Sister   . ADD / ADHD Daughter   . Dementia Neg Hx   . OCD Neg Hx   . Schizophrenia Neg Hx   . Seizures Neg Hx   . Sexual abuse Neg Hx     Social History:  Social History   Socioeconomic History  . Marital status: Married    Spouse name: Not on file  . Number of children: Not on file  . Years of education: Not on file  . Highest education level: Not on file  Occupational History  . Not on file  Social Needs  . Financial resource strain: Not on file  . Food insecurity:     Worry: Not on file    Inability: Not on file  . Transportation needs:    Medical: Not on file    Non-medical: Not on file  Tobacco Use  . Smoking status: Current Every Day Smoker    Packs/day: 1.00    Years: 15.00    Pack years: 15.00    Types: Cigarettes  . Smokeless tobacco: Never Used  . Tobacco comment: 16-20 cigarettes a day as of 06/08/2013  Substance and Sexual Activity  . Alcohol use: Yes    Alcohol/week: 36.0 standard drinks    Types: 36 Cans of beer per week    Comment: Reports drinks 5-6 beers a day.   . Drug use: No  . Sexual activity: Yes    Partners: Female    Birth control/protection: None  Lifestyle  . Physical activity:    Days per week: Not on file    Minutes per session: Not on file  . Stress: Not on file  Relationships  . Social connections:    Talks on phone: Not on file    Gets together: Not on file    Attends religious service: Not on file    Active member of club or organization: Not on file    Attends meetings of clubs or organizations: Not on file    Relationship status: Not on file  Other Topics Concern  . Not on file  Social History Narrative  . Not on file    Allergies:  Allergies  Allergen Reactions  . Penicillins Rash    Has patient had a PCN reaction causing immediate rash, facial/tongue/throat swelling, SOB or lightheadedness with hypotension: Yes Has patient had a PCN reaction causing severe rash involving mucus membranes or skin necrosis: No Has patient had a PCN reaction that required hospitalization No Has patient had a PCN reaction occurring within the last 10 years: No If all of the above answers are "NO", then may proceed with Cephalosporin use.     Metabolic Disorder Labs: Lab Results  Component Value Date   HGBA1C 5.6 12/30/2012   MPG 114 12/30/2012   No results found for: PROLACTIN No results found for: CHOL, TRIG, HDL, CHOLHDL, VLDL, LDLCALC No results found for: TSH  Therapeutic Level Labs: Lab Results   Component Value Date   LITHIUM 1.05 01/21/2012   LITHIUM 0.97 10/24/2011   Lab Results  Component Value Date   VALPROATE 60.5 06/16/2008   No components found for:  CBMZ  Current Medications: Current Outpatient Medications  Medication Sig Dispense Refill  . alprazolam (XANAX) 2 MG tablet Take 1 tablet (2 mg total) by mouth 3 (three) times daily. 90 tablet 2  . amLODipine (NORVASC)  10 MG tablet Take 5 mg by mouth at bedtime.     Marland Kitchen buPROPion (WELLBUTRIN SR) 150 MG 12 hr tablet Take 1 tablet (150 mg total) by mouth 3 (three) times daily. 90 tablet 3  . busPIRone (BUSPAR) 30 MG tablet TAKE (1) TABLET BY MOUTH (3) TIMES DAILY. 90 tablet 2  . carbamazepine (TEGRETOL XR) 200 MG 12 hr tablet TAKE 7 TABLETS BY MOUTH AT BEDTIME. 210 tablet 2  . clindamycin (CLEOCIN T) 1 % SWAB     . levETIRAcetam (KEPPRA) 500 MG tablet Take 1,000 mg by mouth 2 (two) times daily.     Marland Kitchen lisinopril-hydrochlorothiazide (PRINZIDE,ZESTORETIC) 20-12.5 MG tablet Take 1 tablet by mouth daily.     . Lurasidone HCl 120 MG TABS Take 1 tablet (120 mg total) by mouth daily with supper. 90 tablet 2  . minocycline (MINOCIN,DYNACIN) 100 MG capsule Take 100 mg by mouth daily.     Marland Kitchen morphine (MS CONTIN) 30 MG 12 hr tablet Take 30 mg by mouth 2 (two) times daily.    Marland Kitchen oxyCODONE-acetaminophen (PERCOCET) 10-325 MG per tablet Take 1 tablet by mouth every 4 (four) hours as needed for pain.    . tamsulosin (FLOMAX) 0.4 MG CAPS capsule Take 0.4 mg by mouth daily.    Marland Kitchen tiZANidine (ZANAFLEX) 4 MG tablet Take 4 mg by mouth 3 (three) times daily.    Marland Kitchen tretinoin (RETIN-A) 0.05 % cream Apply 1 application topically 2 (two) times daily.    Marland Kitchen zolpidem (AMBIEN CR) 12.5 MG CR tablet Take 1 tablet (12.5 mg total) by mouth at bedtime as needed for sleep. 30 tablet 2   No current facility-administered medications for this visit.      Musculoskeletal: Strength & Muscle Tone: within normal limits Gait & Station: normal Patient leans:  N/A  Psychiatric Specialty Exam: Review of Systems  Psychiatric/Behavioral: The patient is nervous/anxious.   All other systems reviewed and are negative.   There were no vitals taken for this visit.There is no height or weight on file to calculate BMI.  General Appearance: Casual and Fairly Groomed  Eye Contact:  Good  Speech:  Clear and Coherent  Volume:  Normal  Mood:  Anxious  Affect:  Appropriate and Congruent  Thought Process:  Goal Directed  Orientation:  Full (Time, Place, and Person)  Thought Content: Rumination   Suicidal Thoughts:  No  Homicidal Thoughts:  No  Memory:  Immediate;   Good Recent;   Good Remote;   Fair  Judgement:  Fair  Insight:  Fair  Psychomotor Activity:  Normal  Concentration:  Concentration: Good and Attention Span: Good  Recall:  Good  Fund of Knowledge: Fair  Language: Good  Akathisia:  No  Handed:  Right  AIMS (if indicated): not done  Assets:  Communication Skills Desire for Improvement Physical Health Resilience Social Support Talents/Skills  ADL's:  Intact  Cognition: WNL  Sleep:  Poor   Screenings:   Assessment and Plan:  This patient is a 38 year old male with a history of bipolar disorder.  He is doing well on his current regimen other than problems with sleep.  He will therefore change Ambien to Ambien CR 12.5 mg at bedtime.  He will continue Xanax 2 mg 3 times daily for anxiety as well as BuSpar 30 mg 3 times daily for anxiety, continue Wellbutrin SR 150 mg 3 times daily for depression, lurasidone 120 mg daily for mood stabilization and carbamazepine 200 mg - 7 tablets at bedtime.  He  will return to see me in 3 months  Edward Ruder, MD 05/18/2019, 8:47 AM

## 2019-05-25 ENCOUNTER — Telehealth (HOSPITAL_COMMUNITY): Payer: Self-pay | Admitting: *Deleted

## 2019-05-25 NOTE — Telephone Encounter (Signed)
[facility]  APPROVED ZOLPIDEM TARTRATE ER   AUTHORIZATION # [id number]  VALID THRU:  June 02,2020  ----  December 31,2020

## 2019-07-27 DIAGNOSIS — I10 Essential (primary) hypertension: Secondary | ICD-10-CM | POA: Diagnosis not present

## 2019-07-27 DIAGNOSIS — Z0000 Encounter for general adult medical examination without abnormal findings: Secondary | ICD-10-CM | POA: Diagnosis not present

## 2019-07-27 DIAGNOSIS — E782 Mixed hyperlipidemia: Secondary | ICD-10-CM | POA: Diagnosis not present

## 2019-07-29 DIAGNOSIS — M545 Low back pain: Secondary | ICD-10-CM | POA: Diagnosis not present

## 2019-07-29 DIAGNOSIS — I10 Essential (primary) hypertension: Secondary | ICD-10-CM | POA: Diagnosis not present

## 2019-07-29 DIAGNOSIS — Z79891 Long term (current) use of opiate analgesic: Secondary | ICD-10-CM | POA: Diagnosis not present

## 2019-07-29 DIAGNOSIS — G603 Idiopathic progressive neuropathy: Secondary | ICD-10-CM | POA: Diagnosis not present

## 2019-08-10 ENCOUNTER — Other Ambulatory Visit (HOSPITAL_COMMUNITY): Payer: Self-pay | Admitting: Psychiatry

## 2019-08-18 ENCOUNTER — Ambulatory Visit (INDEPENDENT_AMBULATORY_CARE_PROVIDER_SITE_OTHER): Payer: Medicare Other | Admitting: Psychiatry

## 2019-08-18 ENCOUNTER — Other Ambulatory Visit: Payer: Self-pay

## 2019-08-18 ENCOUNTER — Encounter (HOSPITAL_COMMUNITY): Payer: Self-pay | Admitting: Psychiatry

## 2019-08-18 DIAGNOSIS — F319 Bipolar disorder, unspecified: Secondary | ICD-10-CM

## 2019-08-18 MED ORDER — LURASIDONE HCL 120 MG PO TABS: 120.0000 mg | ORAL_TABLET | Freq: Every day | ORAL | 2 refills | Status: DC

## 2019-08-18 MED ORDER — ALPRAZOLAM 2 MG PO TABS: ORAL_TABLET | ORAL | 2 refills | Status: DC

## 2019-08-18 MED ORDER — CARBAMAZEPINE ER 200 MG PO TB12: ORAL_TABLET | ORAL | 2 refills | Status: DC

## 2019-08-18 MED ORDER — ZOLPIDEM TARTRATE ER 12.5 MG PO TBCR: 12.5000 mg | EXTENDED_RELEASE_TABLET | Freq: Every evening | ORAL | 2 refills | Status: DC | PRN

## 2019-08-18 MED ORDER — BUPROPION HCL ER (SR) 150 MG PO TB12: 150.0000 mg | ORAL_TABLET | Freq: Three times a day (TID) | ORAL | 3 refills | Status: DC

## 2019-08-18 MED ORDER — BUSPIRONE HCL 30 MG PO TABS: 30.0000 mg | ORAL_TABLET | Freq: Three times a day (TID) | ORAL | 2 refills | Status: DC

## 2019-08-18 NOTE — Progress Notes (Signed)
Virtual Visit via Video Note  I connected with Edward Lutz on 08/18/19 at  8:40 AM EDT by a video enabled telemedicine application and verified that I am speaking with the correct person using two identifiers.   I discussed the limitations of evaluation and management by telemedicine and the availability of in person appointments. The patient expressed understanding and agreed to proceed.     I discussed the assessment and treatment plan with the patient. The patient was provided an opportunity to ask questions and all were answered. The patient agreed with the plan and demonstrated an understanding of the instructions.   The patient was advised to call back or seek an in-person evaluation if the symptoms worsen or if the condition fails to improve as anticipated.  I provided 15 minutes of non-face-to-face time during this encounter.   Levonne Spiller, MD  [facility] MD/PA/NP OP Progress Note  08/18/2019 9:05 AM Edward Lutz  MRN:  [id number]  Chief Complaint:  Chief Complaint    Follow-up; Manic Behavior; Depression; Anxiety     HPI: This patient is a 38 year old married white male who lives with his wife, a daughter 56years old and a boy 17years old in [place].He and his wife  have custody of their 29-year-old nephew.he is on disability.  The patient states that he has a history of both substance abuse and bipolar disorder. About 10 years ago he was heavily using crack cocaine and marijuana and alcohol. He developed severe mood issues and was diagnosed as bipolar. He was [facility] in 2009 at  [facility] for suicide attempts. He he still has significant problems with short-term memory and temper but he feels he is more stable and he's ever been. He thinks his current combination of medicines has been very helpful. His mood is stable and he is able to take care of his family while his wife works and he is sleeping well.  The patient returns after 3 months.   He states that he is doing fairly well.  He has custody of his 79-year-old nephew who is his sister's child.  He has been allowing the child to go visit his sister but she has been acting erratically not having any sort of structure or rules for the child.  The child is also made reports that people have been wandering around the house at night he is very frightened.  He states that his sister is trying to get custody back and he is going to have to fight this in court.  He is stressed about this but in general his mood is fairly stable.  I have changed his Ambien to Ambien CR and he is sleeping better.  He denies any auditory visual hallucinations paranoia serious depression or suicidal ideation. Visit Diagnosis:    ICD-10-CM   1. Bipolar 1 disorder ([place])  F31.9     Past Psychiatric History: Past admissions in 2009 for suicide attempts  Past Medical History:  Past Medical History:  Diagnosis Date  . Back pain   . Bipolar disorder ([place])   . HTN (hypertension)   . OSA (obstructive sleep apnea)     Past Surgical History:  Procedure Laterality Date  . RHINOPLASTY    . TONSILLECTOMY      Family Psychiatric History: see below  Family History:  Family History  Problem Relation Age of Onset  . Bipolar disorder Mother   . Drug abuse Mother   . Anxiety disorder Mother   . Paranoid behavior  Mother   . Physical abuse Mother   . Bipolar disorder Father   . Alcohol abuse Father   . Drug abuse Father   . Anxiety disorder Father   . Paranoid behavior Father   . Physical abuse Father   . Anxiety disorder Sister   . Depression Sister   . ADD / ADHD Daughter   . Dementia Neg Hx   . OCD Neg Hx   . Schizophrenia Neg Hx   . Seizures Neg Hx   . Sexual abuse Neg Hx     Social History:  Social History   Socioeconomic History  . Marital status: Married    Spouse name: Not on file  . Number of children: Not on file  . Years of education: Not on file  . Highest education level: Not on  file  Occupational History  . Not on file  Social Needs  . Financial resource strain: Not on file  . Food insecurity    Worry: Not on file    Inability: Not on file  . Transportation needs    Medical: Not on file    Non-medical: Not on file  Tobacco Use  . Smoking status: Current Every Day Smoker    Packs/day: 1.00    Years: 15.00    Pack years: 15.00    Types: Cigarettes  . Smokeless tobacco: Never Used  . Tobacco comment: 16-20 cigarettes a day as of 06/08/2013  Substance and Sexual Activity  . Alcohol use: Yes    Alcohol/week: 36.0 standard drinks    Types: 36 Cans of beer per week    Comment: Reports drinks 5-6 beers a day.   . Drug use: No  . Sexual activity: Yes    Partners: Female    Birth control/protection: None  Lifestyle  . Physical activity    Days per week: Not on file    Minutes per session: Not on file  . Stress: Not on file  Relationships  . Social Musician on phone: Not on file    Gets together: Not on file    Attends religious service: Not on file    Active member of club or organization: Not on file    Attends meetings of clubs or organizations: Not on file    Relationship status: Not on file  Other Topics Concern  . Not on file  Social History Narrative  . Not on file    Allergies:  Allergies  Allergen Reactions  . Penicillins Rash    Has patient had a PCN reaction causing immediate rash, facial/tongue/throat swelling, SOB or lightheadedness with hypotension: Yes Has patient had a PCN reaction causing severe rash involving mucus membranes or skin necrosis: No Has patient had a PCN reaction that required hospitalization No Has patient had a PCN reaction occurring within the last 10 years: No If all of the above answers are "NO", then may proceed with Cephalosporin use.     Metabolic Disorder Labs: Lab Results  Component Value Date   HGBA1C 5.6 12/30/2012   MPG 114 12/30/2012   No results found for: PROLACTIN No results  found for: CHOL, TRIG, HDL, CHOLHDL, VLDL, LDLCALC No results found for: TSH  Therapeutic Level Labs: Lab Results  Component Value Date   LITHIUM 1.05 01/21/2012   LITHIUM 0.97 10/24/2011   Lab Results  Component Value Date   VALPROATE 60.5 06/16/2008   No components found for:  CBMZ  Current Medications: Current Outpatient Medications  Medication Sig  Dispense Refill  . alprazolam (XANAX) 2 MG tablet TAKE ONE TABLET BY MOUTH 3 TIMES A DAY. 90 tablet 2  . amLODipine (NORVASC) 10 MG tablet Take 5 mg by mouth at bedtime.     Marland Kitchen. buPROPion (WELLBUTRIN SR) 150 MG 12 hr tablet Take 1 tablet (150 mg total) by mouth 3 (three) times daily. 90 tablet 3  . busPIRone (BUSPAR) 30 MG tablet Take 1 tablet (30 mg total) by mouth 3 (three) times daily. 90 tablet 2  . carbamazepine (TEGRETOL XR) 200 MG 12 hr tablet Take 7 tablets at bedtime 210 tablet 2  . clindamycin (CLEOCIN T) 1 % SWAB     . levETIRAcetam (KEPPRA) 500 MG tablet Take 1,000 mg by mouth 2 (two) times daily.     Marland Kitchen. lisinopril-hydrochlorothiazide (PRINZIDE,ZESTORETIC) 20-12.5 MG tablet Take 1 tablet by mouth daily.     . Lurasidone HCl 120 MG TABS Take 1 tablet (120 mg total) by mouth daily with supper. 90 tablet 2  . minocycline (MINOCIN,DYNACIN) 100 MG capsule Take 100 mg by mouth daily.     Marland Kitchen. morphine (MS CONTIN) 30 MG 12 hr tablet Take 30 mg by mouth 2 (two) times daily.    Marland Kitchen. oxyCODONE-acetaminophen (PERCOCET) 10-325 MG per tablet Take 1 tablet by mouth every 4 (four) hours as needed for pain.    . tamsulosin (FLOMAX) 0.4 MG CAPS capsule Take 0.4 mg by mouth daily.    Marland Kitchen. tiZANidine (ZANAFLEX) 4 MG tablet Take 4 mg by mouth 3 (three) times daily.    Marland Kitchen. tretinoin (RETIN-A) 0.05 % cream Apply 1 application topically 2 (two) times daily.    Marland Kitchen. zolpidem (AMBIEN CR) 12.5 MG CR tablet Take 1 tablet (12.5 mg total) by mouth at bedtime as needed for sleep. 30 tablet 2   No current facility-administered medications for this visit.       Musculoskeletal: Strength & Muscle Tone: within normal limits Gait & Station: normal Patient leans: N/A  Psychiatric Specialty Exam: Review of Systems  All other systems reviewed and are negative.   There were no vitals taken for this visit.There is no height or weight on file to calculate BMI.  General Appearance: Casual and Fairly Groomed  Eye Contact:  Good  Speech:  Clear and Coherent  Volume:  Normal  Mood:  Euthymic  Affect:  Appropriate and Congruent  Thought Process:  Goal Directed  Orientation:  Full (Time, Place, and Person)  Thought Content: Rumination   Suicidal Thoughts:  No  Homicidal Thoughts:  No  Memory:  Immediate;   Good Recent;   Good Remote;   Fair  Judgement:  Good  Insight:  Fair  Psychomotor Activity:  Normal  Concentration:  Concentration: Good and Attention Span: Good  Recall:  Good  Fund of Knowledge: Fair  Language: Good  Akathisia:  No  Handed:  Right  AIMS (if indicated): not done  Assets:  Communication Skills Desire for Improvement Physical Health Resilience Social Support Talents/Skills  ADL's:  Intact  Cognition: WNL  Sleep:  Fair   Screenings:   Assessment and Plan: This patient is a 38 year old male with a history of bipolar disorder.  He is doing well on his current regimen.  He will continue Ambien CR 12.5 mg at bedtime for sleep, Xanax 2 mg 3 times daily for anxiety, BuSpar 30 mg 3 times daily for anxiety, Wellbutrin SR 150 mg 3 times daily for depression, lurasidone 120 mg daily for mood stabilization and carbamazepine 2100 mg at bedtime  for mood stabilization.  He will return to see me in 3 months   Diannia Rudereborah Amal Renbarger, MD 08/18/2019, 9:05 AM

## 2019-09-28 DIAGNOSIS — I10 Essential (primary) hypertension: Secondary | ICD-10-CM | POA: Diagnosis not present

## 2019-09-28 DIAGNOSIS — R7301 Impaired fasting glucose: Secondary | ICD-10-CM | POA: Diagnosis not present

## 2019-09-28 DIAGNOSIS — E782 Mixed hyperlipidemia: Secondary | ICD-10-CM | POA: Diagnosis not present

## 2019-10-07 DIAGNOSIS — Z0001 Encounter for general adult medical examination with abnormal findings: Secondary | ICD-10-CM | POA: Diagnosis not present

## 2019-10-07 DIAGNOSIS — R7301 Impaired fasting glucose: Secondary | ICD-10-CM | POA: Diagnosis not present

## 2019-10-07 DIAGNOSIS — E782 Mixed hyperlipidemia: Secondary | ICD-10-CM | POA: Diagnosis not present

## 2019-10-07 DIAGNOSIS — I10 Essential (primary) hypertension: Secondary | ICD-10-CM | POA: Diagnosis not present

## 2019-10-07 DIAGNOSIS — Z720 Tobacco use: Secondary | ICD-10-CM | POA: Diagnosis not present

## 2019-10-21 DIAGNOSIS — I10 Essential (primary) hypertension: Secondary | ICD-10-CM | POA: Diagnosis not present

## 2019-10-21 DIAGNOSIS — M545 Low back pain: Secondary | ICD-10-CM | POA: Diagnosis not present

## 2019-10-21 DIAGNOSIS — G603 Idiopathic progressive neuropathy: Secondary | ICD-10-CM | POA: Diagnosis not present

## 2019-10-21 DIAGNOSIS — Z79891 Long term (current) use of opiate analgesic: Secondary | ICD-10-CM | POA: Diagnosis not present

## 2019-10-25 ENCOUNTER — Other Ambulatory Visit (HOSPITAL_COMMUNITY): Payer: Self-pay | Admitting: Psychiatry

## 2019-11-03 DIAGNOSIS — Z0000 Encounter for general adult medical examination without abnormal findings: Secondary | ICD-10-CM | POA: Diagnosis not present

## 2019-11-03 DIAGNOSIS — I10 Essential (primary) hypertension: Secondary | ICD-10-CM | POA: Diagnosis not present

## 2019-11-03 DIAGNOSIS — E782 Mixed hyperlipidemia: Secondary | ICD-10-CM | POA: Diagnosis not present

## 2019-11-08 ENCOUNTER — Other Ambulatory Visit (HOSPITAL_COMMUNITY): Payer: Self-pay | Admitting: Psychiatry

## 2019-11-17 ENCOUNTER — Ambulatory Visit (INDEPENDENT_AMBULATORY_CARE_PROVIDER_SITE_OTHER): Payer: Medicare Other | Admitting: Psychiatry

## 2019-11-17 ENCOUNTER — Encounter (HOSPITAL_COMMUNITY): Payer: Self-pay | Admitting: Psychiatry

## 2019-11-17 ENCOUNTER — Other Ambulatory Visit: Payer: Self-pay

## 2019-11-17 DIAGNOSIS — F319 Bipolar disorder, unspecified: Secondary | ICD-10-CM | POA: Diagnosis not present

## 2019-11-17 MED ORDER — BUPROPION HCL ER (SR) 150 MG PO TB12: 150.0000 mg | ORAL_TABLET | Freq: Three times a day (TID) | ORAL | 3 refills | Status: DC

## 2019-11-17 MED ORDER — CARBAMAZEPINE ER 200 MG PO TB12: ORAL_TABLET | ORAL | 2 refills | Status: DC

## 2019-11-17 MED ORDER — BUSPIRONE HCL 30 MG PO TABS: 30.0000 mg | ORAL_TABLET | Freq: Three times a day (TID) | ORAL | 2 refills | Status: DC

## 2019-11-17 MED ORDER — ZOLPIDEM TARTRATE ER 12.5 MG PO TBCR: 12.5000 mg | EXTENDED_RELEASE_TABLET | Freq: Every evening | ORAL | 2 refills | Status: DC | PRN

## 2019-11-17 MED ORDER — ALPRAZOLAM 2 MG PO TABS: ORAL_TABLET | ORAL | 2 refills | Status: DC

## 2019-11-17 MED ORDER — LURASIDONE HCL 120 MG PO TABS: 120.0000 mg | ORAL_TABLET | Freq: Every day | ORAL | 2 refills | Status: DC

## 2019-11-17 NOTE — Progress Notes (Signed)
Virtual Visit via Video Note  I connected with Edward Lutz on 11/17/19 at  8:40 AM EST by a video enabled telemedicine application and verified that I am speaking with the correct person using two identifiers.   I discussed the limitations of evaluation and management by telemedicine and the availability of in person appointments. The patient expressed understanding and agreed to proceed.    I discussed the assessment and treatment plan with the patient. The patient was provided an opportunity to ask questions and all were answered. The patient agreed with the plan and demonstrated an understanding of the instructions.   The patient was advised to call back or seek an in-person evaluation if the symptoms worsen or if the condition fails to improve as anticipated.  I provided 15 minutes of non-face-to-face time during this encounter.   Edward Spiller, MD  [facility] MD/PA/NP OP Progress Note  11/17/2019 9:01 AM Edward Lutz  MRN:  [id number]  Chief Complaint:  Chief Complaint    Depression; Anxiety; Manic Behavior; Follow-up     HPI: This patient is a 38 year old married white male who lives with his wife, a daughter 70years old and a boy 96years old in [place].He and his wife  have custody of their 93-year-old nephew.he is on disability.  The patient states that he has a history of both substance abuse and bipolar disorder. About 10 years ago he was heavily using crack cocaine and marijuana and alcohol. He developed severe mood issues and was diagnosed as bipolar. He was hospitalized twice in 2009 at [facility] for suicide attempts. He he still has significant problems with short-term memory and temper but he feels he is more stable and he's ever been. He thinks his current combination of medicines has been very helpful. His mood is stable and he is able to take care of his family while his wife works and he is sleeping well.  The patient returns for follow-up  after 3 months.  He states in general he is doing okay but he has been more irritable lately.  He has not yet had the mediation hearing regarding his 73-year-old nephew.  He states that his sister, the boy's mother, claims that she wants full custody.  Yet he does not feel that she is mentally stable enough to take care of him.  The hearing has been postponed because of the coronavirus.  The patient was recently started on phentermine for weight loss by his primary physician and I wondered if this might be causing some irritability.  He is not yet sure.  His cholesterol is up and he was started on Crestor.  He has fewer outlets during the day since he has to spend most of his time helping his nephew with virtual school.  He has very little room to go up on any of his mood stabilizers but we can go up a little bit on the Tegretol.  He still drinking about 6-8 beers a day which has not changed since I first met him.   Visit Diagnosis:    ICD-10-CM   1. Bipolar 1 disorder ([place])  F31.9     Past Psychiatric History: Past admissions in 2009 for suicide attempts.  Past Medical History:  Past Medical History:  Diagnosis Date  . Back pain   . Bipolar disorder ([place])   . HTN (hypertension)   . OSA (obstructive sleep apnea)     Past Surgical History:  Procedure Laterality Date  . RHINOPLASTY    .  TONSILLECTOMY      Family Psychiatric History: See below  Family History:  Family History  Problem Relation Age of Onset  . Bipolar disorder Mother   . Drug abuse Mother   . Anxiety disorder Mother   . Paranoid behavior Mother   . Physical abuse Mother   . Bipolar disorder Father   . Alcohol abuse Father   . Drug abuse Father   . Anxiety disorder Father   . Paranoid behavior Father   . Physical abuse Father   . Anxiety disorder Sister   . Depression Sister   . ADD / ADHD Daughter   . Dementia Neg Hx   . OCD Neg Hx   . Schizophrenia Neg Hx   . Seizures Neg Hx   . Sexual abuse Neg Hx      Social History:  Social History   Socioeconomic History  . Marital status: Married    Spouse name: Not on file  . Number of children: Not on file  . Years of education: Not on file  . Highest education level: Not on file  Occupational History  . Not on file  Social Needs  . Financial resource strain: Not on file  . Food insecurity    Worry: Not on file    Inability: Not on file  . Transportation needs    Medical: Not on file    Non-medical: Not on file  Tobacco Use  . Smoking status: Current Every Day Smoker    Packs/day: 1.00    Years: 15.00    Pack years: 15.00    Types: Cigarettes  . Smokeless tobacco: Never Used  . Tobacco comment: 16-20 cigarettes a day as of 06/08/2013  Substance and Sexual Activity  . Alcohol use: Yes    Alcohol/week: 36.0 standard drinks    Types: 36 Cans of beer per week    Comment: Reports drinks 5-6 beers a day.   . Drug use: No  . Sexual activity: Yes    Partners: Female    Birth control/protection: None  Lifestyle  . Physical activity    Days per week: Not on file    Minutes per session: Not on file  . Stress: Not on file  Relationships  . Social Herbalist on phone: Not on file    Gets together: Not on file    Attends religious service: Not on file    Active member of club or organization: Not on file    Attends meetings of clubs or organizations: Not on file    Relationship status: Not on file  Other Topics Concern  . Not on file  Social History Narrative  . Not on file    Allergies:  Allergies  Allergen Reactions  . Penicillins Rash    Has patient had a PCN reaction causing immediate rash, facial/tongue/throat swelling, SOB or lightheadedness with hypotension: Yes Has patient had a PCN reaction causing severe rash involving mucus membranes or skin necrosis: No Has patient had a PCN reaction that required hospitalization No Has patient had a PCN reaction occurring within the last 10 years: No If all of the  above answers are "NO", then may proceed with Cephalosporin use.     Metabolic Disorder Labs: Lab Results  Component Value Date   HGBA1C 5.6 12/30/2012   MPG 114 12/30/2012   No results found for: PROLACTIN No results found for: CHOL, TRIG, HDL, CHOLHDL, VLDL, LDLCALC No results found for: TSH  Therapeutic Level Labs: Lab  Results  Component Value Date   LITHIUM 1.05 01/21/2012   LITHIUM 0.97 10/24/2011   Lab Results  Component Value Date   VALPROATE 60.5 06/16/2008   No components found for:  CBMZ  Current Medications: Current Outpatient Medications  Medication Sig Dispense Refill  . alprazolam (XANAX) 2 MG tablet TAKE ONE TABLET BY MOUTH 3 TIMES A DAY. 90 tablet 2  . amLODipine (NORVASC) 10 MG tablet Take 5 mg by mouth at bedtime.     Marland Kitchen buPROPion (WELLBUTRIN SR) 150 MG 12 hr tablet Take 1 tablet (150 mg total) by mouth 3 (three) times daily. 90 tablet 3  . busPIRone (BUSPAR) 30 MG tablet Take 1 tablet (30 mg total) by mouth 3 (three) times daily. 90 tablet 2  . carbamazepine (TEGRETOL XR) 200 MG 12 hr tablet Take 8 tablets at bedtime 240 tablet 2  . clindamycin (CLEOCIN T) 1 % SWAB     . levETIRAcetam (KEPPRA) 500 MG tablet Take 1,000 mg by mouth 2 (two) times daily.     Marland Kitchen lisinopril-hydrochlorothiazide (PRINZIDE,ZESTORETIC) 20-12.5 MG tablet Take 1 tablet by mouth daily.     . Lurasidone HCl 120 MG TABS Take 1 tablet (120 mg total) by mouth daily with supper. 90 tablet 2  . minocycline (MINOCIN,DYNACIN) 100 MG capsule Take 100 mg by mouth daily.     Marland Kitchen morphine (MS CONTIN) 30 MG 12 hr tablet Take 30 mg by mouth 2 (two) times daily.    Marland Kitchen oxyCODONE-acetaminophen (PERCOCET) 10-325 MG per tablet Take 1 tablet by mouth every 4 (four) hours as needed for pain.    . phentermine (ADIPEX-P) 37.5 MG tablet Take 37.5 mg by mouth daily.    . phentermine 15 MG capsule Take 15 mg by mouth daily.    . rosuvastatin (CRESTOR) 10 MG tablet Take 10 mg by mouth at bedtime.    . tamsulosin  (FLOMAX) 0.4 MG CAPS capsule Take 0.4 mg by mouth daily.    Marland Kitchen tiZANidine (ZANAFLEX) 4 MG tablet Take 4 mg by mouth 3 (three) times daily.    Marland Kitchen tretinoin (RETIN-A) 0.05 % cream Apply 1 application topically 2 (two) times daily.    Marland Kitchen zolpidem (AMBIEN CR) 12.5 MG CR tablet Take 1 tablet (12.5 mg total) by mouth at bedtime as needed for sleep. 30 tablet 2   No current facility-administered medications for this visit.      Musculoskeletal: Strength & Muscle Tone: within normal limits Gait & Station: normal Patient leans: N/A  Psychiatric Specialty Exam: Review of Systems  All other systems reviewed and are negative.   There were no vitals taken for this visit.There is no height or weight on file to calculate BMI.  General Appearance: Casual and Fairly Groomed  Eye Contact:  Good  Speech:  Clear and Coherent  Volume:  Normal  Mood:  Irritable  Affect:  Appropriate and Congruent  Thought Process:  Goal Directed  Orientation:  Full (Time, Place, and Person)  Thought Content: Rumination   Suicidal Thoughts:  No  Homicidal Thoughts:  No  Memory:  Immediate;   Good Recent;   Good Remote;   Good  Judgement:  Good  Insight:  Fair  Psychomotor Activity:  Normal  Concentration:  Concentration: Good and Attention Span: Good  Recall:  Good  Fund of Knowledge: Fair  Language: Good  Akathisia:  No  Handed:  Right  AIMS (if indicated): not done  Assets:  Communication Skills Desire for Improvement Physical Health Resilience Social Support Talents/Skills  ADL's:  Intact  Cognition: WNL  Sleep:  Fair   Screenings:   Assessment and Plan: This patient is a 38 year old male with a history of bipolar disorder.  He reports increased irritability and temper outbursts.  We will therefore increase Tegretol 200 mg to 8 tablets at bedtime.  He will continue Ambien CR 12.5 mg at bedtime for sleep, Xanax 2 mg 3 times daily for anxiety, BuSpar 30 mg 3 times daily for anxiety, Wellbutrin SR 150  mg 3 times daily for depression and lurasidone 120 mg daily for mood stabilization.  He denies any thoughts of self-harm.  He will return to see me in 2 months or call sooner if his irritability worsens   Edward Spiller, MD 11/17/2019, 9:01 AM

## 2019-11-19 DIAGNOSIS — I10 Essential (primary) hypertension: Secondary | ICD-10-CM | POA: Diagnosis not present

## 2019-11-19 DIAGNOSIS — Z0000 Encounter for general adult medical examination without abnormal findings: Secondary | ICD-10-CM | POA: Diagnosis not present

## 2019-11-19 DIAGNOSIS — E782 Mixed hyperlipidemia: Secondary | ICD-10-CM | POA: Diagnosis not present

## 2019-12-31 DIAGNOSIS — E782 Mixed hyperlipidemia: Secondary | ICD-10-CM | POA: Diagnosis not present

## 2019-12-31 DIAGNOSIS — I10 Essential (primary) hypertension: Secondary | ICD-10-CM | POA: Diagnosis not present

## 2019-12-31 DIAGNOSIS — Z0000 Encounter for general adult medical examination without abnormal findings: Secondary | ICD-10-CM | POA: Diagnosis not present

## 2020-01-13 DIAGNOSIS — G603 Idiopathic progressive neuropathy: Secondary | ICD-10-CM | POA: Diagnosis not present

## 2020-01-13 DIAGNOSIS — Z79891 Long term (current) use of opiate analgesic: Secondary | ICD-10-CM | POA: Diagnosis not present

## 2020-01-13 DIAGNOSIS — M545 Low back pain: Secondary | ICD-10-CM | POA: Diagnosis not present

## 2020-01-13 DIAGNOSIS — I10 Essential (primary) hypertension: Secondary | ICD-10-CM | POA: Diagnosis not present

## 2020-01-13 DIAGNOSIS — E782 Mixed hyperlipidemia: Secondary | ICD-10-CM | POA: Diagnosis not present

## 2020-01-13 DIAGNOSIS — R7301 Impaired fasting glucose: Secondary | ICD-10-CM | POA: Diagnosis not present

## 2020-01-18 ENCOUNTER — Ambulatory Visit (INDEPENDENT_AMBULATORY_CARE_PROVIDER_SITE_OTHER): Payer: Medicare Other | Admitting: Psychiatry

## 2020-01-18 ENCOUNTER — Encounter (HOSPITAL_COMMUNITY): Payer: Self-pay | Admitting: Psychiatry

## 2020-01-18 ENCOUNTER — Other Ambulatory Visit: Payer: Self-pay

## 2020-01-18 DIAGNOSIS — F319 Bipolar disorder, unspecified: Secondary | ICD-10-CM

## 2020-01-18 MED ORDER — BUSPIRONE HCL 30 MG PO TABS: 30.0000 mg | ORAL_TABLET | Freq: Three times a day (TID) | ORAL | 2 refills | Status: DC

## 2020-01-18 MED ORDER — ALPRAZOLAM 2 MG PO TABS: ORAL_TABLET | ORAL | 2 refills | Status: DC

## 2020-01-18 MED ORDER — ZOLPIDEM TARTRATE ER 12.5 MG PO TBCR: 12.5000 mg | EXTENDED_RELEASE_TABLET | Freq: Every evening | ORAL | 2 refills | Status: DC | PRN

## 2020-01-18 MED ORDER — LURASIDONE HCL 120 MG PO TABS: 120.0000 mg | ORAL_TABLET | Freq: Every day | ORAL | 2 refills | Status: DC

## 2020-01-18 MED ORDER — CARBAMAZEPINE ER 200 MG PO TB12: ORAL_TABLET | ORAL | 2 refills | Status: DC

## 2020-01-18 MED ORDER — BUPROPION HCL ER (SR) 150 MG PO TB12: 150.0000 mg | ORAL_TABLET | Freq: Three times a day (TID) | ORAL | 3 refills | Status: DC

## 2020-01-18 NOTE — Progress Notes (Signed)
Virtual Visit via Video Note  I connected with Edward Lutz on 01/18/20 at  8:40 AM EST by a video enabled telemedicine application and verified that I am speaking with the correct person using two identifiers.   I discussed the limitations of evaluation and management by telemedicine and the availability of in person appointments. The patient expressed understanding and agreed to proceed    I discussed the assessment and treatment plan with the patient. The patient was provided an opportunity to ask questions and all were answered. The patient agreed with the plan and demonstrated an understanding of the instructions.   The patient was advised to call back or seek an in-person evaluation if the symptoms worsen or if the condition fails to improve as anticipated.  I provided 15 minutes of non-face-to-face time during this encounter.   Edward Spiller, MD  [facility] MD/PA/NP OP Progress Note  01/18/2020 9:16 AM Edward Lutz  MRN:  [id number]  Chief Complaint:  Chief Complaint    Depression; Manic Behavior; Anxiety; Follow-up     HPI:  This patient is a39 year old married white male who lives with his wife, a daughter 54years old and a boy 51years old in [place].He and his wifehave custody of their 37-year-old nephew.he is on disability.  The patient states that he has a history of both substance abuse and bipolar disorder. About 10 years ago he was heavily using crack cocaine and marijuana and alcohol. He developed severe mood issues and was diagnosed as bipolar. He was hospitalized twice in 2009 at [facility] for suicide attempts. He he still has significant problems with short-term memory and temper but he feels he is more stable and he's ever been. He thinks his current combination of medicines has been very helpful. His mood is stable and he is able to take care of his family while his wife works and he is sleeping well.  The patient returns for follow-up after  3 months.  Last time we increased his Tegretol because he was complaining about increased irritability.  It seems to have helped a little bit.  He still is quite worried because his 30-year-old nephew is still not in his permanent custody.  His sister, the boy's mother did not show up for the mediation hearing and claims she wants to take it to court.  However she spends very little time with her son and often does not respond to his calls or texts.  It does not sound like he she has much of a case for regaining custody but the patient continues to worry about this.  He also states as does his wife that he is "obsessed" with sex.  He often wants to have sex with her multiple times a day.  He denies obsessions with anything else although he claims he does not like change in his life.  It sounds as if this is a way of him maintaining control and keeping her close.  In the past he was worried that she was having affairs even though that this was not the case.  I suggested that he start therapy regarding this but he declined.  They did state that they would work this up between them as best they can.  The patient denies serious depression suicidal ideation auditory visual hallucinations.  His sleep is variable but the Ambien and Xanax help a good deal with it. Visit Diagnosis:    ICD-10-CM   1. Bipolar 1 disorder ([facility])  F31.9  Past Psychiatric History: Past admissions in 2009 for suicide attempts  Past Medical History:  Past Medical History:  Diagnosis Date  . Back pain   . Bipolar disorder ([facility])   . HTN (hypertension)   . OSA (obstructive sleep apnea)     Past Surgical History:  Procedure Laterality Date  . RHINOPLASTY    . TONSILLECTOMY      Family Psychiatric History: see below  Family History:  Family History  Problem Relation Age of Onset  . Bipolar disorder Mother   . Drug abuse Mother   . Anxiety disorder Mother   . Paranoid behavior Mother   . Physical abuse Mother   . Bipolar  disorder Father   . Alcohol abuse Father   . Drug abuse Father   . Anxiety disorder Father   . Paranoid behavior Father   . Physical abuse Father   . Anxiety disorder Sister   . Depression Sister   . ADD / ADHD Daughter   . Dementia Neg Hx   . OCD Neg Hx   . Schizophrenia Neg Hx   . Seizures Neg Hx   . Sexual abuse Neg Hx     Social History:  Social History   Socioeconomic History  . Marital status: Married    Spouse name: Not on file  . Number of children: Not on file  . Years of education: Not on file  . Highest education level: Not on file  Occupational History  . Not on file  Tobacco Use  . Smoking status: Current Every Day Smoker    Packs/day: 1.00    Years: 15.00    Pack years: 15.00    Types: Cigarettes  . Smokeless tobacco: Never Used  . Tobacco comment: 16-20 cigarettes a day as of 06/08/2013  Substance and Sexual Activity  . Alcohol use: Yes    Alcohol/week: 36.0 standard drinks    Types: 36 Cans of beer per week    Comment: Reports drinks 5-6 beers a day.   . Drug use: No  . Sexual activity: Yes    Partners: Female    Birth control/protection: None  Other Topics Concern  . Not on file  Social History Narrative  . Not on file   Social Determinants of Health   Financial Resource Strain:   . Difficulty of Paying Living Expenses: Not on file  Food Insecurity:   . Worried About Programme researcher, broadcasting/film/video in the Last Year: Not on file  . Ran Out of Food in the Last Year: Not on file  Transportation Needs:   . Lack of Transportation (Medical): Not on file  . Lack of Transportation (Non-Medical): Not on file  Physical Activity:   . Days of Exercise per Week: Not on file  . Minutes of Exercise per Session: Not on file  Stress:   . Feeling of Stress : Not on file  Social Connections:   . Frequency of Communication with Friends and Family: Not on file  . Frequency of Social Gatherings with Friends and Family: Not on file  . Attends Religious Services: Not on  file  . Active Member of Clubs or Organizations: Not on file  . Attends Banker Meetings: Not on file  . Marital Status: Not on file    Allergies:  Allergies  Allergen Reactions  . Penicillins Rash    Has patient had a PCN reaction causing immediate rash, facial/tongue/throat swelling, SOB or lightheadedness with hypotension: Yes Has patient had a PCN reaction  causing severe rash involving mucus membranes or skin necrosis: No Has patient had a PCN reaction that required hospitalization No Has patient had a PCN reaction occurring within the last 10 years: No If all of the above answers are "NO", then may proceed with Cephalosporin use.     Metabolic Disorder Labs: Lab Results  Component Value Date   HGBA1C 5.6 12/30/2012   MPG 114 12/30/2012   No results found for: PROLACTIN No results found for: CHOL, TRIG, HDL, CHOLHDL, VLDL, LDLCALC No results found for: TSH  Therapeutic Level Labs: Lab Results  Component Value Date   LITHIUM 1.05 01/21/2012   LITHIUM 0.97 10/24/2011   Lab Results  Component Value Date   VALPROATE 60.5 06/16/2008   No components found for:  CBMZ  Current Medications: Current Outpatient Medications  Medication Sig Dispense Refill  . alprazolam (XANAX) 2 MG tablet TAKE ONE TABLET BY MOUTH 3 TIMES A DAY. 270 tablet 2  . amLODipine (NORVASC) 10 MG tablet Take 5 mg by mouth at bedtime.     Marland Kitchen buPROPion (WELLBUTRIN SR) 150 MG 12 hr tablet Take 1 tablet (150 mg total) by mouth 3 (three) times daily. 270 tablet 3  . busPIRone (BUSPAR) 30 MG tablet Take 1 tablet (30 mg total) by mouth 3 (three) times daily. 270 tablet 2  . carbamazepine (TEGRETOL XR) 200 MG 12 hr tablet Take 8 tablets at bedtime 240 tablet 2  . clindamycin (CLEOCIN T) 1 % SWAB     . levETIRAcetam (KEPPRA) 500 MG tablet Take 1,000 mg by mouth 2 (two) times daily.     Marland Kitchen lisinopril-hydrochlorothiazide (PRINZIDE,ZESTORETIC) 20-12.5 MG tablet Take 1 tablet by mouth daily.     .  Lurasidone HCl 120 MG TABS Take 1 tablet (120 mg total) by mouth daily with supper. 90 tablet 2  . minocycline (MINOCIN,DYNACIN) 100 MG capsule Take 100 mg by mouth daily.     Marland Kitchen morphine (MS CONTIN) 30 MG 12 hr tablet Take 30 mg by mouth 2 (two) times daily.    Marland Kitchen oxyCODONE-acetaminophen (PERCOCET) 10-325 MG per tablet Take 1 tablet by mouth every 4 (four) hours as needed for pain.    . phentermine (ADIPEX-P) 37.5 MG tablet Take 37.5 mg by mouth daily.    . phentermine 15 MG capsule Take 15 mg by mouth daily.    . rosuvastatin (CRESTOR) 10 MG tablet Take 10 mg by mouth at bedtime.    . tamsulosin (FLOMAX) 0.4 MG CAPS capsule Take 0.4 mg by mouth daily.    Marland Kitchen tiZANidine (ZANAFLEX) 4 MG tablet Take 4 mg by mouth 3 (three) times daily.    Marland Kitchen tretinoin (RETIN-A) 0.05 % cream Apply 1 application topically 2 (two) times daily.    Marland Kitchen zolpidem (AMBIEN CR) 12.5 MG CR tablet Take 1 tablet (12.5 mg total) by mouth at bedtime as needed for sleep. 90 tablet 2   No current facility-administered medications for this visit.     Musculoskeletal: Strength & Muscle Tone: within normal limits Gait & Station: normal Patient leans: N/A  Psychiatric Specialty Exam: Review of Systems  Psychiatric/Behavioral: The patient is nervous/anxious.   All other systems reviewed and are negative.   There were no vitals taken for this visit.There is no height or weight on file to calculate BMI.  General Appearance: Casual and Fairly Groomed  Eye Contact:  Good  Speech:  Clear and Coherent  Volume:  Normal  Mood:  Anxious  Affect:  Appropriate and Congruent  Thought Process:  Goal Directed  Orientation:  Full (Time, Place, and Person)  Thought Content: Obsessions and Rumination   Suicidal Thoughts:  No  Homicidal Thoughts:  No  Memory:  Immediate;   Good Recent;   Good Remote;   Fair  Judgement:  Good  Insight:  Fair  Psychomotor Activity:  Normal  Concentration:  Concentration: Good and Attention Span: Good   Recall:  Good  Fund of Knowledge: Good  Language: Good  Akathisia:  No  Handed:  Right  AIMS (if indicated): not done  Assets:  Communication Skills Desire for Improvement Physical Health Resilience Social Support Talents/Skills  ADL's:  Intact  Cognition: WNL  Sleep:  Fair   Screenings:   Assessment and Plan: This patient is a 39 year old male with a history of bipolar disorder and obsessional symptoms at times.  He does state he has had some improvement in irritability with the increase Tegretol.  I think he would benefit from counseling but he declines adamantly.  We will therefore continue Tegretol 200 mg - 8 tablets at bedtime for mood stability, lurasidone 120 mg daily also for mood stability, Ambien CR 12.5 mg at bedtime for sleep, Xanax 2 mg 3 times daily for anxiety, BuSpar 30 mg 3 times daily for anxiety, Wellbutrin SR 150 mg 3 times daily for depression.  He will return to see me in 3 months   [name], MD 01/18/2020, 9:16 AM

## 2020-01-20 DIAGNOSIS — Z720 Tobacco use: Secondary | ICD-10-CM | POA: Diagnosis not present

## 2020-01-20 DIAGNOSIS — R7301 Impaired fasting glucose: Secondary | ICD-10-CM | POA: Diagnosis not present

## 2020-01-20 DIAGNOSIS — E782 Mixed hyperlipidemia: Secondary | ICD-10-CM | POA: Diagnosis not present

## 2020-01-20 DIAGNOSIS — I10 Essential (primary) hypertension: Secondary | ICD-10-CM | POA: Diagnosis not present

## 2020-01-27 DIAGNOSIS — E782 Mixed hyperlipidemia: Secondary | ICD-10-CM | POA: Diagnosis not present

## 2020-01-27 DIAGNOSIS — I10 Essential (primary) hypertension: Secondary | ICD-10-CM | POA: Diagnosis not present

## 2020-01-27 DIAGNOSIS — E7849 Other hyperlipidemia: Secondary | ICD-10-CM | POA: Diagnosis not present

## 2020-02-07 ENCOUNTER — Telehealth (HOSPITAL_COMMUNITY): Payer: Self-pay | Admitting: *Deleted

## 2020-02-07 NOTE — Telephone Encounter (Signed)
[employer] MEDICARE [phone]  QUANTITY LIMIT #30/30 DAYS  Lurasidone HCl 120 MG TABS   MEMBER SERVICES CAN GRANT MEMBER'S OVERRIDE @ THEIR REQUEST. MEMBER HAS TO CALL THE #

## 2020-04-01 DIAGNOSIS — Z0000 Encounter for general adult medical examination without abnormal findings: Secondary | ICD-10-CM | POA: Diagnosis not present

## 2020-04-01 DIAGNOSIS — I10 Essential (primary) hypertension: Secondary | ICD-10-CM | POA: Diagnosis not present

## 2020-04-01 DIAGNOSIS — E7849 Other hyperlipidemia: Secondary | ICD-10-CM | POA: Diagnosis not present

## 2020-04-06 DIAGNOSIS — G894 Chronic pain syndrome: Secondary | ICD-10-CM | POA: Diagnosis not present

## 2020-04-06 DIAGNOSIS — E782 Mixed hyperlipidemia: Secondary | ICD-10-CM | POA: Diagnosis not present

## 2020-04-06 DIAGNOSIS — I10 Essential (primary) hypertension: Secondary | ICD-10-CM | POA: Diagnosis not present

## 2020-04-06 DIAGNOSIS — M25512 Pain in left shoulder: Secondary | ICD-10-CM | POA: Diagnosis not present

## 2020-04-06 DIAGNOSIS — M542 Cervicalgia: Secondary | ICD-10-CM | POA: Diagnosis not present

## 2020-04-13 ENCOUNTER — Ambulatory Visit (HOSPITAL_COMMUNITY): Payer: Medicare Other | Attending: Orthopaedic Surgery | Admitting: Physical Therapy

## 2020-04-13 ENCOUNTER — Other Ambulatory Visit: Payer: Self-pay

## 2020-04-13 ENCOUNTER — Encounter (HOSPITAL_COMMUNITY): Payer: Self-pay | Admitting: Physical Therapy

## 2020-04-13 DIAGNOSIS — M79622 Pain in left upper arm: Secondary | ICD-10-CM

## 2020-04-13 DIAGNOSIS — M542 Cervicalgia: Secondary | ICD-10-CM | POA: Insufficient documentation

## 2020-04-13 DIAGNOSIS — M25512 Pain in left shoulder: Secondary | ICD-10-CM | POA: Diagnosis not present

## 2020-04-13 NOTE — Therapy (Signed)
[facility] [address] [place], [place], [address] Phone: [phone]   Fax:  [phone]  Physical Therapy Evaluation  Patient Details  Name: Edward Lutz MRN: [id number] Date of Birth: Aug 29, 1981 Referring Provider (PT): Ramond Marrow MD    Encounter Date: 04/13/2020  PT End of Session - 04/13/20 0851    Visit Number  1    Number of Visits  8    Date for PT Re-Evaluation  05/12/20    Authorization Type  UHC Medicare; Medicaid Secondary No auth    Progress Note Due on Visit  8    PT Start Time  0815    PT Stop Time  0855    PT Time Calculation (min)  40 min    Activity Tolerance  Patient limited by pain    Behavior During Therapy  [facility] for tasks assessed/performed       Past Medical History:  Diagnosis Date  . Back pain   . Bipolar disorder (HCC)   . HTN (hypertension)   . OSA (obstructive sleep apnea)     Past Surgical History:  Procedure Laterality Date  . RHINOPLASTY    . TONSILLECTOMY      There were no vitals filed for this visit.   Subjective Assessment - 04/13/20 0820    Subjective  Patient presents to physical therapy with complaint of neck and LT shoulder pain. Says pain began insidiously about 3 weeks ago. Says he noticed pain in his elbow one evening, then to his wrist, then to his shoulder and by the next day had pain in his neck as well. Patient says pain is worse in evening, Patient has had xrays and was diagnosed with pinched nerve in his neck. Patient says he was given 2 injections and prescribed steroids which he says were not helping. Notes that he has low back issues, and says he thinks something else is wrong and is wanting to get an MRI but "has to try this first".    Limitations  Sitting;Reading;Lifting;House hold activities    Diagnostic tests  xrays    Currently in Pain?  Yes    Pain Location  Arm    Pain Orientation  Left    Pain Descriptors / Indicators  Burning;Stabbing    Pain Type  Acute  pain    Pain Radiating Towards  LT hand    Pain Onset  1 to 4 weeks ago    Pain Frequency  Constant    Aggravating Factors   movement    Pain Relieving Factors  holding arm close to body    Effect of Pain on Daily Activities  Limits         [facility] PT Assessment - 04/13/20 0001      Assessment   Medical Diagnosis  cervical spine and shoulder pain (LT)    Referring Provider (PT)  Ramond Marrow MD     Onset Date/Surgical Date  --   early April   Next MD Visit  --   Not sure, end of May    Prior Therapy  No       Precautions   Precautions  None      Restrictions   Weight Bearing Restrictions  No      Balance Screen   Has the patient fallen in the past 6 months  No      Home Environment   Living Environment  Private residence      Prior Function  Level of Independence  Independent      Cognition   Overall Cognitive Status  Within Functional Limits for tasks assessed      Observation/Other Assessments   Observations  --   Holds LUE in flexed, IR, gaurded position    Focus on Therapeutic Outcomes (FOTO)   34% limited       Sensation   Light Touch  Impaired by gross assessment   decreased sensation to light touch about LUE      Posture/Postural Control   Posture/Postural Control  Postural limitations    Postural Limitations  Rounded Shoulders;Forward head      ROM / Strength   AROM / PROM / Strength  AROM;Strength      AROM   Overall AROM Comments  --   Min restriciton in LT shoulder flexion and funcitonal ER    AROM Assessment Site  Cervical    Cervical Flexion  34    Cervical Extension  32   mild pain    Cervical - Right Side Bend  19    Cervical - Left Side Bend  30   pain on LT    Cervical - Right Rotation  55    Cervical - Left Rotation  54      Strength   Strength Assessment Site  Shoulder;Elbow    Right/Left Shoulder  Right;Left    Right Shoulder Flexion  5/5    Right Shoulder ABduction  5/5    Right Shoulder External Rotation  4+/5    Left  Shoulder Flexion  4/5    Left Shoulder ABduction  4/5    Left Shoulder External Rotation  4/5    Right/Left Elbow  Right;Left    Right Elbow Flexion  4+/5    Right Elbow Extension  4+/5    Left Elbow Flexion  4/5    Left Elbow Extension  4/5      Palpation   Palpation comment  Mod tenderness to palpation                 Objective measurements completed on examination: See above findings.      [facility] PT Treatment/Exercise - 04/13/20 0001      Exercises   Exercises  Neck      Neck Exercises: Seated   Other Seated Exercise  retraction x 10 (worse), retraction extension (worse), retraction LT rotation (no effect)              PT Education - 04/13/20 0822    Education Details  on evaluation findings, and POC    Person(s) Educated  Patient    Methods  Explanation    Comprehension  Verbalized understanding       PT Short Term Goals - 04/13/20 0856      PT SHORT TERM GOAL #1   Title  Patient will be independent with initial HEP and self-management strategies to improve functional outcomes    Time  2    Period  Weeks    Status  New    Target Date  04/28/20        PT Long Term Goals - 04/13/20 0856      PT LONG TERM GOAL #1   Title  Patient will improve FOTO score to <25% to indicate improvement in functional outcomes    Time  4    Period  Weeks    Status  New    Target Date  05/12/20      PT LONG  TERM GOAL #2   Title  Patient will report at least 75% overall improvement in subjective complaint to indicate improvement in ability to perform ADLs.    Time  4    Period  Weeks    Status  New    Target Date  05/12/20      PT LONG TERM GOAL #3   Title  Patient will have equal to or > 4+/5 MMT throughout LUE to improve ability to perform UE ADLs    Time  4    Period  Weeks    Status  New    Target Date  05/12/20      PT LONG TERM GOAL #4   Title  Patient will have 0/10 pain at rest to improve sleep and overall quality of life    Time  4     Period  Weeks    Status  New    Target Date  05/12/20             Plan - 04/13/20 1308    Clinical Impression Statement  Patient is a 39 y.o. male who presents to physical therapy with complaint of LT side neck and arm pain. Patient demonstrates decreased strength, ROM restriction, reduced flexibility, increased tenderness to palpation and postural abnormalities which are likely contributing to symptoms of pain and are negatively impacting patient ability to perform ADLs. Patient will benefit from skilled physical therapy services to address these deficits to reduce pain and improve level of function with ADLs    Examination-Activity Limitations  Lift;Carry;Sleep;Hygiene/Grooming;Dressing;Other;Self Feeding;Caring for Others;Reach Overhead    Examination-Participation Restrictions  Yard Work;Cleaning;Laundry;Volunteer;Driving;Community Activity    Stability/Clinical Decision Making  Stable/Uncomplicated    Clinical Decision Making  Low    Rehab Potential  Good    PT Frequency  2x / week    PT Duration  4 weeks    PT Treatment/Interventions  ADLs/Self Care Home Management;Aquatic Therapy;Biofeedback;Cryotherapy;Electrical Stimulation;Contrast Bath;Fluidtherapy;Parrafin;Ultrasound;Therapeutic activities;Therapeutic exercise;Traction;DME Instruction;Iontophoresis 4mg /ml Dexamethasone;Moist Heat;Neuromuscular re-education;Scar mobilization;Passive range of motion;Dry needling;Spinal Manipulations;Manual techniques;Patient/family education;Manual lymph drainage;Energy conservation;Splinting;Joint Manipulations;Taping;Compression bandaging;Orthotic Fit/Training;Vasopneumatic Device    PT Next Visit Plan  Review goals, Issue HEP. HEP to consist of supine cervical retraction, scap retraction. Add manual to address cervical restricitons. Progress cervical mobility and postural strengthening as tolerated. Investigate possibility of radial nerve entrapment.    PT Home Exercise Plan  Issue next visit     Consulted and Agree with Plan of Care  Patient       Patient will benefit from skilled therapeutic intervention in order to improve the following deficits and impairments:  Pain, Improper body mechanics, Impaired sensation, Increased fascial restricitons, Decreased mobility, Postural dysfunction, Decreased activity tolerance, Decreased range of motion, Decreased strength, Hypomobility, Impaired UE functional use, Impaired flexibility  Visit Diagnosis: Cervicalgia  Left shoulder pain, unspecified chronicity  Pain in left upper arm     Problem List Patient Active Problem List   Diagnosis Date Noted  . OCD (obsessive compulsive disorder) 04/08/2013  . Elevated liver enzymes 12/30/2012  . Unspecified vitamin D deficiency 12/30/2012  . Family history of diabetes mellitus 12/30/2012  . Pain 11/11/2012  . 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 Memory difficulties 11/11/2012  . Bipolar 1 disorder (HCC) 01/21/2012    9:02 AM, 04/13/20 04/15/20 PT DPT  Physical Therapist with [place]  [facility]  [phone]   [facility] [address] [place], [place], [place] Phone: [phone]   Fax:  [phone]  Name: Edward Lutz MRN: [id number] Date of Birth: 1981-07-27

## 2020-04-17 ENCOUNTER — Other Ambulatory Visit: Payer: Self-pay

## 2020-04-17 ENCOUNTER — Telehealth (INDEPENDENT_AMBULATORY_CARE_PROVIDER_SITE_OTHER): Payer: Medicare Other | Admitting: Psychiatry

## 2020-04-17 ENCOUNTER — Encounter (HOSPITAL_COMMUNITY): Payer: Self-pay | Admitting: Psychiatry

## 2020-04-17 DIAGNOSIS — F319 Bipolar disorder, unspecified: Secondary | ICD-10-CM | POA: Diagnosis not present

## 2020-04-17 MED ORDER — ZOLPIDEM TARTRATE ER 12.5 MG PO TBCR: 12.5000 mg | EXTENDED_RELEASE_TABLET | Freq: Every evening | ORAL | 2 refills | Status: DC | PRN

## 2020-04-17 MED ORDER — BUSPIRONE HCL 30 MG PO TABS: 30.0000 mg | ORAL_TABLET | Freq: Three times a day (TID) | ORAL | 2 refills | Status: DC

## 2020-04-17 MED ORDER — CARBAMAZEPINE ER 200 MG PO TB12: ORAL_TABLET | ORAL | 2 refills | Status: DC

## 2020-04-17 MED ORDER — ALPRAZOLAM 2 MG PO TABS: ORAL_TABLET | ORAL | 2 refills | Status: DC

## 2020-04-17 MED ORDER — LURASIDONE HCL 120 MG PO TABS: 120.0000 mg | ORAL_TABLET | Freq: Every day | ORAL | 2 refills | Status: DC

## 2020-04-17 MED ORDER — BUPROPION HCL ER (SR) 150 MG PO TB12: 150.0000 mg | ORAL_TABLET | Freq: Three times a day (TID) | ORAL | 3 refills | Status: DC

## 2020-04-17 NOTE — Progress Notes (Signed)
Virtual Visit via Telephone Note  I connected with Edward Lutz on 04/17/20 at  8:40 AM EDT by telephone and verified that I am speaking with the correct person using two identifiers.   I discussed the limitations, risks, security and privacy concerns of performing an evaluation and management service by telephone and the availability of in person appointments. I also discussed with the patient that there may be a patient responsible charge related to this service. The patient expressed understanding and agreed to proceed.    I discussed the assessment and treatment plan with the patient. The patient was provided an opportunity to ask questions and all were answered. The patient agreed with the plan and demonstrated an understanding of the instructions.   The patient was advised to call back or seek an in-person evaluation if the symptoms worsen or if the condition fails to improve as anticipated.  I provided 15 minutes of non-face-to-face time during this encounter.   Levonne Spiller, MD  [facility] MD/PA/NP OP Progress Note  04/17/2020 9:08 AM Edward Lutz  MRN:  [id number]  Chief Complaint:  Chief Complaint    Depression; Anxiety; Manic Behavior; Follow-up     HPI: This patient is a39 year old married white male who lives with his wife, a daughter 14years old and a boy 85years old in [place].He and his wifehave custody of their75-year-old nephew.he is on disability.  The patient states that he has a history of both substance abuse and bipolar disorder. About 10 years ago he was heavily using crack cocaine and marijuana and alcohol. He developed severe mood issues and was diagnosed as bipolar. He was hospitalized twice in 2009 at [facility] for suicide attempts. He he still has significant problems with short-term memory and temper but he feels he is more stable and he's ever been. He thinks his current combination of medicines has been very helpful. His mood is  stable and he is able to take care of his family while his wife works and he is sleeping well.  The patient returns for follow-up after 3 months.  In general he is doing okay.  He is very worried about his sister who is the mother of the nephew that he cares for.  She states that "she is gone off the deep end."  He states that she has been prostituting herself heavily involved in substance abuse and crashed up her car.  She has another 20-year-old boy living with her who is autistic and not getting the services he needs.  Apparently DSS has been called but nothing yet has been done about the other child.  He worries about this a lot.  However that he realizes there is not much he can do other than to care of his nephew.  For the most part his mood has been stable particularly since we increased his Tegretol.  His sleep is variable but he feels like he gets enough most nights.  He denies any temper outbursts or violent spells.  He denies severe depression or suicidal ideation Visit Diagnosis:    ICD-10-CM   1. Bipolar 1 disorder ([place])  F31.9     Past Psychiatric History: Past admissions in 2009 for suicide attempts  Past Medical History:  Past Medical History:  Diagnosis Date  . Back pain   . Bipolar disorder ([place])   . HTN (hypertension)   . OSA (obstructive sleep apnea)     Past Surgical History:  Procedure Laterality Date  . RHINOPLASTY    .  TONSILLECTOMY      Family Psychiatric History: see below   Family History:  Family History  Problem Relation Age of Onset  . Bipolar disorder Mother   . Drug abuse Mother   . Anxiety disorder Mother   . Paranoid behavior Mother   . Physical abuse Mother   . Bipolar disorder Father   . Alcohol abuse Father   . Drug abuse Father   . Anxiety disorder Father   . Paranoid behavior Father   . Physical abuse Father   . Anxiety disorder Sister   . Depression Sister   . ADD / ADHD Daughter   . Dementia Neg Hx   . OCD Neg Hx   . Schizophrenia  Neg Hx   . Seizures Neg Hx   . Sexual abuse Neg Hx     Social History:  Social History   Socioeconomic History  . Marital status: Married    Spouse name: Not on file  . Number of children: Not on file  . Years of education: Not on file  . Highest education level: Not on file  Occupational History  . Not on file  Tobacco Use  . Smoking status: Current Every Day Smoker    Packs/day: 1.00    Years: 15.00    Pack years: 15.00    Types: Cigarettes  . Smokeless tobacco: Never Used  . Tobacco comment: 16-20 cigarettes a day as of 06/08/2013  Substance and Sexual Activity  . Alcohol use: Yes    Alcohol/week: 36.0 standard drinks    Types: 36 Cans of beer per week    Comment: Reports drinks 5-6 beers a day.   . Drug use: No  . Sexual activity: Yes    Partners: Female    Birth control/protection: None  Other Topics Concern  . Not on file  Social History Narrative  . Not on file   Social Determinants of Health   Financial Resource Strain:   . Difficulty of Paying Living Expenses:   Food Insecurity:   . Worried About Programme researcher, broadcasting/film/video in the Last Year:   . Barista in the Last Year:   Transportation Needs:   . Freight forwarder (Medical):   Marland Kitchen Lack of Transportation (Non-Medical):   Physical Activity:   . Days of Exercise per Week:   . Minutes of Exercise per Session:   Stress:   . Feeling of Stress :   Social Connections:   . Frequency of Communication with Friends and Family:   . Frequency of Social Gatherings with Friends and Family:   . Attends Religious Services:   . Active Member of Clubs or Organizations:   . Attends Banker Meetings:   Marland Kitchen Marital Status:     Allergies:  Allergies  Allergen Reactions  . Penicillins Rash    Has patient had a PCN reaction causing immediate rash, facial/tongue/throat swelling, SOB or lightheadedness with hypotension: Yes Has patient had a PCN reaction causing severe rash involving mucus membranes or  skin necrosis: No Has patient had a PCN reaction that required hospitalization No Has patient had a PCN reaction occurring within the last 10 years: No If all of the above answers are "NO", then may proceed with Cephalosporin use.     Metabolic Disorder Labs: Lab Results  Component Value Date   HGBA1C 5.6 12/30/2012   MPG 114 12/30/2012   No results found for: PROLACTIN No results found for: CHOL, TRIG, HDL, CHOLHDL, VLDL, LDLCALC No  results found for: TSH  Therapeutic Level Labs: Lab Results  Component Value Date   LITHIUM 1.05 01/21/2012   LITHIUM 0.97 10/24/2011   Lab Results  Component Value Date   VALPROATE 60.5 06/16/2008   No components found for:  CBMZ  Current Medications: Current Outpatient Medications  Medication Sig Dispense Refill  . alprazolam (XANAX) 2 MG tablet TAKE ONE TABLET BY MOUTH 3 TIMES A DAY. 270 tablet 2  . amLODipine (NORVASC) 10 MG tablet Take 5 mg by mouth at bedtime.     Marland Kitchen buPROPion (WELLBUTRIN SR) 150 MG 12 hr tablet Take 1 tablet (150 mg total) by mouth 3 (three) times daily. 270 tablet 3  . busPIRone (BUSPAR) 30 MG tablet Take 1 tablet (30 mg total) by mouth 3 (three) times daily. 270 tablet 2  . carbamazepine (TEGRETOL XR) 200 MG 12 hr tablet Take 8 tablets at bedtime 240 tablet 2  . clindamycin (CLEOCIN T) 1 % SWAB     . levETIRAcetam (KEPPRA) 500 MG tablet Take 1,000 mg by mouth 2 (two) times daily.     Marland Kitchen lisinopril-hydrochlorothiazide (PRINZIDE,ZESTORETIC) 20-12.5 MG tablet Take 1 tablet by mouth daily.     . Lurasidone HCl 120 MG TABS Take 1 tablet (120 mg total) by mouth daily with supper. 90 tablet 2  . minocycline (MINOCIN,DYNACIN) 100 MG capsule Take 100 mg by mouth daily.     Marland Kitchen morphine (MS CONTIN) 30 MG 12 hr tablet Take 30 mg by mouth 2 (two) times daily.    Marland Kitchen oxyCODONE-acetaminophen (PERCOCET) 10-325 MG per tablet Take 1 tablet by mouth every 4 (four) hours as needed for pain.    . phentermine (ADIPEX-P) 37.5 MG tablet Take 37.5  mg by mouth daily.    . phentermine 15 MG capsule Take 15 mg by mouth daily.    . rosuvastatin (CRESTOR) 10 MG tablet Take 10 mg by mouth at bedtime.    . tamsulosin (FLOMAX) 0.4 MG CAPS capsule Take 0.4 mg by mouth daily.    Marland Kitchen tiZANidine (ZANAFLEX) 4 MG tablet Take 4 mg by mouth 3 (three) times daily.    Marland Kitchen tretinoin (RETIN-A) 0.05 % cream Apply 1 application topically 2 (two) times daily.    Marland Kitchen zolpidem (AMBIEN CR) 12.5 MG CR tablet Take 1 tablet (12.5 mg total) by mouth at bedtime as needed for sleep. 90 tablet 2   No current facility-administered medications for this visit.     Musculoskeletal: Strength & Muscle Tone: within normal limits Gait & Station: normal Patient leans: N/A  Psychiatric Specialty Exam: Review of Systems  Psychiatric/Behavioral: The patient is nervous/anxious.   All other systems reviewed and are negative.   There were no vitals taken for this visit.There is no height or weight on file to calculate BMI.  General Appearance: NA  Eye Contact: NA  Speech:  Clear and Coherent  Volume:  Normal  Mood:  Euthymic  Affect:  NA  Thought Process:  Goal Directed  Orientation:  Full (Time, Place, and Person)  Thought Content: Rumination   Suicidal Thoughts:  No  Homicidal Thoughts:  No  Memory:  Immediate;   Good Recent;   Good Remote;   Good  Judgement:  Good  Insight:  Fair  Psychomotor Activity:  Normal  Concentration:  Concentration: Good and Attention Span: Good  Recall:  Good  Fund of Knowledge: Fair  Language: Good  Akathisia:  No  Handed:  Right  AIMS (if indicated): not done  Assets:  Manufacturing systems engineer  Desire for Improvement Resilience Social Support Talents/Skills  ADL's:  Intact  Cognition: WNL  Sleep:  Fair   Screenings:   Assessment and Plan: This patient is a 39 year old male with a history bipolar disorder and obsessional symptoms at time.  Overall he is doing fairly well.  He will continue Tegretol 200 mg - 8 tablets at bedtime  for mood stability, lurasidone 120 mg daily also for mood stability, Ambien CR 12.5 mg at bedtime for sleep, Xanax 2 mg 3 times daily for anxiety, BuSpar 30 milligrams 3 times daily for anxiety, Wellbutrin SR 150 mg 3 times daily for depression.  He will return to see me in 3 months   [name], MD 04/17/2020, 9:08 AM

## 2020-04-18 ENCOUNTER — Ambulatory Visit (HOSPITAL_COMMUNITY): Payer: Medicare Other | Attending: Orthopaedic Surgery

## 2020-04-18 ENCOUNTER — Other Ambulatory Visit: Payer: Self-pay

## 2020-04-18 ENCOUNTER — Encounter (HOSPITAL_COMMUNITY): Payer: Self-pay

## 2020-04-18 DIAGNOSIS — M25512 Pain in left shoulder: Secondary | ICD-10-CM | POA: Insufficient documentation

## 2020-04-18 DIAGNOSIS — M79622 Pain in left upper arm: Secondary | ICD-10-CM | POA: Insufficient documentation

## 2020-04-18 DIAGNOSIS — M542 Cervicalgia: Secondary | ICD-10-CM | POA: Diagnosis not present

## 2020-04-18 NOTE — Therapy (Signed)
[facility] [address] [place], [place], [address] Phone: [phone]   Fax:  [phone]  Physical Therapy Treatment  Patient Details  Name: Edward Lutz MRN: [id number] Date of Birth: 22-Mar-1981 Referring Provider (PT): Ramond Marrow MD    Encounter Date: 04/18/2020  PT End of Session - 04/18/20 1141    Visit Number  2    Number of Visits  8    Date for PT Re-Evaluation  05/12/20    Authorization Type  UHC Medicare; Medicaid Secondary No auth    Progress Note Due on Visit  8    PT Start Time  1134    PT Stop Time  1214    PT Time Calculation (min)  40 min    Activity Tolerance  Patient limited by pain    Behavior During Therapy  [facility] for tasks assessed/performed       Past Medical History:  Diagnosis Date  . Back pain   . Bipolar disorder (HCC)   . HTN (hypertension)   . OSA (obstructive sleep apnea)     Past Surgical History:  Procedure Laterality Date  . RHINOPLASTY    . TONSILLECTOMY      There were no vitals filed for this visit.  Subjective Assessment - 04/18/20 1138    Subjective  Pt reports neck when rotates to left and back, has radicular symptoms down to fingers except for pinky and thumb.    Currently in Pain?  Yes    Pain Location  Neck    Pain Orientation  Left    Pain Type  Acute pain    Pain Radiating Towards  Lt UE, index, middle and ring fingers.    Pain Onset  1 to 4 weeks ago    Pain Frequency  Constant    Aggravating Factors   movement, sitting    Pain Relieving Factors  helping arm close to body, laying on back    Effect of Pain on Daily Activities  limits                       [facility] Adult PT Treatment/Exercise - 04/18/20 0001      Posture/Postural Control   Posture/Postural Control  Postural limitations    Postural Limitations  Rounded Shoulders;Forward head    Posture Comments  elevated Lt shoulder      Exercises   Exercises  Neck      Neck Exercises: Seated   Cervical Rotation  5 reps    Cervical Rotation Limitations  3D cervical excursion in pain free range    Postural Training  Educated importance of proper posture    Other Seated Exercise  scapular retraction 10x 3"      Manual Therapy   Manual Therapy  Soft tissue mobilization;Manual Traction    Manual therapy comments  Manual complete separate than rest of tx    Soft tissue mobilization  STM to cervical mm in supine    Manual Traction  Manual cervical tractions 2x 2'             PT Education - 04/18/20 1145    Education Details  Reviewed goals, educated importance of HEP compliance and established exercise program this session.  Pt educated importnace of posture    Person(s) Educated  Patient    Methods  Explanation;Demonstration;Handout    Comprehension  Verbalized understanding;Returned demonstration       PT Short Term Goals - 04/13/20 [phone]  PT SHORT TERM GOAL #1   Title  Patient will be independent with initial HEP and self-management strategies to improve functional outcomes    Time  2    Period  Weeks    Status  New    Target Date  04/28/20        PT Long Term Goals - 04/13/20 0856      PT LONG TERM GOAL #1   Title  Patient will improve FOTO score to <25% to indicate improvement in functional outcomes    Time  4    Period  Weeks    Status  New    Target Date  05/12/20      PT LONG TERM GOAL #2   Title  Patient will report at least 75% overall improvement in subjective complaint to indicate improvement in ability to perform ADLs.    Time  4    Period  Weeks    Status  New    Target Date  05/12/20      PT LONG TERM GOAL #3   Title  Patient will have equal to or > 4+/5 MMT throughout LUE to improve ability to perform UE ADLs    Time  4    Period  Weeks    Status  New    Target Date  05/12/20      PT LONG TERM GOAL #4   Title  Patient will have 0/10 pain at rest to improve sleep and overall quality of life    Time  4    Period  Weeks    Status   New    Target Date  05/12/20            Plan - 04/18/20 1216    Clinical Impression Statement  Reviewed goals, educated importance of HEP compliance and established home exercise program this session.  Pt educated on importance of proper seated posture for pain control. Therex focus with postural strengthening.  Pt reports increased radicular symtoms during cervical retraction.  EOS with manual STM to address restrictions, reports of decreased radicular symptoms during manual traction.    Examination-Activity Limitations  Lift;Carry;Sleep;Hygiene/Grooming;Dressing;Other;Self Feeding;Caring for Others;Reach Overhead    Examination-Participation Restrictions  Yard Work;Cleaning;Laundry;Volunteer;Driving;Community Activity    Stability/Clinical Decision Making  Stable/Uncomplicated    Clinical Decision Making  Low    Rehab Potential  Good    PT Frequency  2x / week    PT Duration  4 weeks    PT Treatment/Interventions  ADLs/Self Care Home Management;Aquatic Therapy;Biofeedback;Cryotherapy;Electrical Stimulation;Contrast Bath;Fluidtherapy;Parrafin;Ultrasound;Therapeutic activities;Therapeutic exercise;Traction;DME Instruction;Iontophoresis 4mg /ml Dexamethasone;Moist Heat;Neuromuscular re-education;Scar mobilization;Passive range of motion;Dry needling;Spinal Manipulations;Manual techniques;Patient/family education;Manual lymph drainage;Energy conservation;Splinting;Joint Manipulations;Taping;Compression bandaging;Orthotic Fit/Training;Vasopneumatic Device    PT Next Visit Plan  Add manual to address cervical restricitons. Progress cervical mobility and postural strengthening as tolerated. Investigate possibility of radial nerve entrapment.    PT Home Exercise Plan  Supine cervical retraction and scapular retractions.       Patient will benefit from skilled therapeutic intervention in order to improve the following deficits and impairments:  Pain, Improper body mechanics, Impaired sensation,  Increased fascial restricitons, Decreased mobility, Postural dysfunction, Decreased activity tolerance, Decreased range of motion, Decreased strength, Hypomobility, Impaired UE functional use, Impaired flexibility  Visit Diagnosis: Cervicalgia  Left shoulder pain, unspecified chronicity  Pain in left upper arm     Problem List Patient Active Problem List   Diagnosis Date Noted  . OCD (obsessive compulsive disorder) 04/08/2013  . Elevated liver enzymes 12/30/2012  . Unspecified vitamin  D deficiency 12/30/2012  . Family history of diabetes mellitus 12/30/2012  . Pain 11/11/2012  . 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 Memory difficulties 11/11/2012  . Bipolar 1 disorder (HCC) 01/21/2012   Becky Sax, LPTA/CLT; CBIS [phone]  Juel Burrow 04/18/2020, 12:26 PM  [facility] [address] [place], [place], [address] Phone: [phone]   Fax:  [phone]  Name: Edward Lutz MRN: [id number] Date of Birth: 01/20/1981

## 2020-04-19 ENCOUNTER — Ambulatory Visit (HOSPITAL_COMMUNITY): Payer: Medicare Other

## 2020-04-19 ENCOUNTER — Encounter (HOSPITAL_COMMUNITY): Payer: Self-pay

## 2020-04-19 DIAGNOSIS — M79622 Pain in left upper arm: Secondary | ICD-10-CM

## 2020-04-19 DIAGNOSIS — M25512 Pain in left shoulder: Secondary | ICD-10-CM | POA: Diagnosis not present

## 2020-04-19 DIAGNOSIS — M542 Cervicalgia: Secondary | ICD-10-CM | POA: Diagnosis not present

## 2020-04-19 NOTE — Therapy (Signed)
[facility] [place], [place], [address] Phone: [phone]   Fax:  [phone]  Physical Therapy Treatment  Patient Details  Name: Edward Lutz MRN: [id number] Date of Birth: 1981/08/02 Referring Provider (PT): Ophelia Charter MD    Encounter Date: 04/19/2020  PT End of Session - 04/19/20 1000    Visit Number  3    Number of Visits  8    Date for PT Re-Evaluation  05/12/20    Authorization Type  UHC Medicare; Medicaid Secondary No auth    Progress Note Due on Visit  8    PT Start Time  0913    PT Stop Time  0955    PT Time Calculation (min)  42 min    Activity Tolerance  Patient limited by pain    Behavior During Therapy  [facility] for tasks assessed/performed       Past Medical History:  Diagnosis Date  . Back pain   . Bipolar disorder ([place])   . HTN (hypertension)   . OSA (obstructive sleep apnea)     Past Surgical History:  Procedure Laterality Date  . RHINOPLASTY    . TONSILLECTOMY      There were no vitals filed for this visit.  Subjective Assessment - 04/19/20 0917    Subjective  Pt stated he feels about the same, main pain Lt elbow down to pointer and middle fingers.  No reoprts of shoulder pain.    Currently in Pain?  Yes    Pain Location  Arm   elbow to fingers   Pain Orientation  Left;Distal    Pain Descriptors / Indicators  Burning;Tingling    Pain Type  Acute pain    Pain Radiating Towards  Lt UE, index and middle finger    Pain Onset  1 to 4 weeks ago    Pain Frequency  Constant    Aggravating Factors   movement, sitting    Pain Relieving Factors  helping arm close to body, laying on back                       [facility] Adult PT Treatment/Exercise - 04/19/20 0001      Posture/Postural Control   Posture/Postural Control  Postural limitations    Postural Limitations  Rounded Shoulders;Forward head    Posture Comments  elevated/guarded Lt shoulder      Exercises   Exercises  Neck      Neck Exercises: Standing   Neck Retraction  5 reps;5 secs    Neck Retraction Limitations  against wall    Other Standing Exercises  Lt radial nerve glide      Neck Exercises: Seated   Cervical Isometrics  Right lateral flexion;Left lateral flexion;Right rotation;Left rotation;5 secs;5 reps    Other Seated Exercise  scapular retraction 10x 3"      Manual Therapy   Manual Therapy  Soft tissue mobilization;Manual Traction    Manual therapy comments  Manual complete separate than rest of tx    Soft tissue mobilization  STM triceps, subscapularis, teres               PT Short Term Goals - 04/13/20 0856      PT SHORT TERM GOAL #1   Title  Patient will be independent with initial HEP and self-management strategies to improve functional outcomes    Time  2    Period  Weeks    Status  New  Target Date  04/28/20        PT Long Term Goals - 04/13/20 0856      PT LONG TERM GOAL #1   Title  Patient will improve FOTO score to <25% to indicate improvement in functional outcomes    Time  4    Period  Weeks    Status  New    Target Date  05/12/20      PT LONG TERM GOAL #2   Title  Patient will report at least 75% overall improvement in subjective complaint to indicate improvement in ability to perform ADLs.    Time  4    Period  Weeks    Status  New    Target Date  05/12/20      PT LONG TERM GOAL #3   Title  Patient will have equal to or > 4+/5 MMT throughout LUE to improve ability to perform UE ADLs    Time  4    Period  Weeks    Status  New    Target Date  05/12/20      PT LONG TERM GOAL #4   Title  Patient will have 0/10 pain at rest to improve sleep and overall quality of life    Time  4    Period  Weeks    Status  New    Target Date  05/12/20            Plan - 04/19/20 1008    Clinical Impression Statement  Added radial nerve glides and manual STM to subscapularis, teres and tricep region with noted musculature restrictions especially in subscapular  region.  Began isometric for cervical mm strengthening following reports of increased radicular symptoms during cervical retraction.    Examination-Activity Limitations  Lift;Carry;Sleep;Hygiene/Grooming;Dressing;Other;Self Feeding;Caring for Others;Reach Overhead    Examination-Participation Restrictions  Yard Work;Cleaning;Laundry;Volunteer;Driving;Community Activity    Stability/Clinical Decision Making  Stable/Uncomplicated    Clinical Decision Making  Low    Rehab Potential  Good    PT Frequency  2x / week    PT Duration  4 weeks    PT Treatment/Interventions  ADLs/Self Care Home Management;Aquatic Therapy;Biofeedback;Cryotherapy;Electrical Stimulation;Contrast Bath;Fluidtherapy;Parrafin;Ultrasound;Therapeutic activities;Therapeutic exercise;Traction;DME Instruction;Iontophoresis 4mg /ml Dexamethasone;Moist Heat;Neuromuscular re-education;Scar mobilization;Passive range of motion;Dry needling;Spinal Manipulations;Manual techniques;Patient/family education;Manual lymph drainage;Energy conservation;Splinting;Joint Manipulations;Taping;Compression bandaging;Orthotic Fit/Training;Vasopneumatic Device    PT Next Visit Plan  Add manual to address cervical/scapular restricitons. Progress cervical mobility and postural strengthening as tolerated. Investigate possibility of radial nerve entrapment.    PT Home Exercise Plan  Supine cervical retraction and scapular retractions.; 5/5: radial nerve glides       Patient will benefit from skilled therapeutic intervention in order to improve the following deficits and impairments:  Pain, Improper body mechanics, Impaired sensation, Increased fascial restricitons, Decreased mobility, Postural dysfunction, Decreased activity tolerance, Decreased range of motion, Decreased strength, Hypomobility, Impaired UE functional use, Impaired flexibility  Visit Diagnosis: Left shoulder pain, unspecified chronicity  Pain in left upper arm  Cervicalgia     Problem  List Patient Active Problem List   Diagnosis Date Noted  . OCD (obsessive compulsive disorder) 04/08/2013  . Elevated liver enzymes 12/30/2012  . Unspecified vitamin D deficiency 12/30/2012  . Family history of diabetes mellitus 12/30/2012  . Pain 11/11/2012  . 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 Memory difficulties 11/11/2012  . Bipolar 1 disorder (HCC) 01/21/2012   03/20/2012, LPTA/CLT; CBIS [phone]  [phone] 04/19/2020, 10:18 AM   [facility] [address]  [place], [place], [address] Phone: [phone]   Fax:  [phone]  Name: DEMETRIS CAPELL MRN: [id number] Date of Birth: July 18, 1981

## 2020-04-20 DIAGNOSIS — I10 Essential (primary) hypertension: Secondary | ICD-10-CM | POA: Diagnosis not present

## 2020-04-20 DIAGNOSIS — M79602 Pain in left arm: Secondary | ICD-10-CM | POA: Diagnosis not present

## 2020-04-20 DIAGNOSIS — M545 Low back pain: Secondary | ICD-10-CM | POA: Diagnosis not present

## 2020-04-20 DIAGNOSIS — Z79891 Long term (current) use of opiate analgesic: Secondary | ICD-10-CM | POA: Diagnosis not present

## 2020-04-25 ENCOUNTER — Ambulatory Visit (HOSPITAL_COMMUNITY): Payer: Medicare Other | Admitting: Physical Therapy

## 2020-04-25 ENCOUNTER — Other Ambulatory Visit: Payer: Self-pay

## 2020-04-25 DIAGNOSIS — M542 Cervicalgia: Secondary | ICD-10-CM

## 2020-04-25 DIAGNOSIS — M25512 Pain in left shoulder: Secondary | ICD-10-CM | POA: Diagnosis not present

## 2020-04-25 DIAGNOSIS — M79622 Pain in left upper arm: Secondary | ICD-10-CM

## 2020-04-25 NOTE — Therapy (Signed)
[facility] [address] [place], [place], [address] Phone: [phone]   Fax:  [phone]  Physical Therapy Treatment  Patient Details  Name: Edward Lutz MRN: [id number] Date of Birth: 1981-10-20 Referring Provider (PT): Ramond Marrow MD    Encounter Date: 04/25/2020  PT End of Session - 04/25/20 0925    Visit Number  4    Number of Visits  8    Date for PT Re-Evaluation  05/12/20    Authorization Type  UHC Medicare; Medicaid Secondary No auth    Progress Note Due on Visit  8    PT Start Time  0915    PT Stop Time  0955    PT Time Calculation (min)  40 min    Activity Tolerance  Patient limited by pain    Behavior During Therapy  [facility] for tasks assessed/performed       Past Medical History:  Diagnosis Date  . Back pain   . Bipolar disorder (HCC)   . HTN (hypertension)   . OSA (obstructive sleep apnea)     Past Surgical History:  Procedure Laterality Date  . RHINOPLASTY    . TONSILLECTOMY      There were no vitals filed for this visit.  Subjective Assessment - 04/25/20 0915    Subjective  PT states that he is doing his exercises and has no questions.,    Currently in Pain?  Yes    Pain Score  3     Pain Location  Neck    Pain Orientation  Left;Lower    Pain Descriptors / Indicators  Aching    Pain Type  Acute pain    Pain Radiating Towards  Lt hand    Pain Onset  1 to 4 weeks ago    Pain Frequency  Constant    Aggravating Factors   movement    Pain Relieving Factors  protective postitioning    Effect of Pain on Daily Activities  limits                       [facility] Adult PT Treatment/Exercise - 04/25/20 0001      Exercises   Exercises  Neck      Neck Exercises: Standing   Neck Retraction  5 reps;5 secs    Upper Extremity Flexion with Stabilization  Flexion;10 reps    Other Standing Exercises  Lt radial nerve glide      Neck Exercises: Seated   Cervical Isometrics  Right lateral  flexion;Left lateral flexion;Right rotation;Left rotation;5 secs;5 reps    W Back  10 reps    Shoulder Shrugs  10 reps    Shoulder Shrugs Limitations  up/back and relax     Other Seated Exercise  cervical and thoracic excursions x 3 each       Manual Therapy   Manual Therapy  Soft tissue mobilization;Manual Traction    Manual therapy comments  Manual complete separate than rest of tx    Soft tissue mobilization  To upper trap, to decrease spasm                PT Short Term Goals - 04/25/20 0934      PT SHORT TERM GOAL #1   Title  Patient will be independent with initial HEP and self-management strategies to improve functional outcomes    Time  2    Period  Weeks    Status  On-going  Target Date  04/28/20        PT Long Term Goals - 04/25/20 0935      PT LONG TERM GOAL #1   Title  Patient will improve FOTO score to <25% to indicate improvement in functional outcomes    Time  4    Period  Weeks    Status  On-going      PT LONG TERM GOAL #2   Title  Patient will report at least 75% overall improvement in subjective complaint to indicate improvement in ability to perform ADLs.    Time  4    Period  Weeks    Status  On-going      PT LONG TERM GOAL #3   Title  Patient will have equal to or > 4+/5 MMT throughout LUE to improve ability to perform UE ADLs    Time  4    Period  Weeks    Status  On-going      PT LONG TERM GOAL #4   Title  Patient will have 0/10 pain at rest to improve sleep and overall quality of life    Time  4    Period  Weeks    Status  New            Plan - 04/25/20 7169    Clinical Impression Statement  PT comes to department with arm in a protective postioning,  Added excursions for HEP.  Manual to attempt to decrease pain with no mm spasm noted.  PT very moves very stiffly in all motions.  Pt states pain is worse in the evening    Examination-Activity Limitations  Lift;Carry;Sleep;Hygiene/Grooming;Dressing;Other;Self Feeding;Caring  for Others;Reach Overhead    Examination-Participation Restrictions  Yard Work;Cleaning;Laundry;Volunteer;Driving;Community Activity    Stability/Clinical Decision Making  Stable/Uncomplicated    Rehab Potential  Good    PT Frequency  2x / week    PT Duration  4 weeks    PT Treatment/Interventions  ADLs/Self Care Home Management;Aquatic Therapy;Biofeedback;Cryotherapy;Electrical Stimulation;Contrast Bath;Fluidtherapy;Parrafin;Ultrasound;Therapeutic activities;Therapeutic exercise;Traction;DME Instruction;Iontophoresis 4mg /ml Dexamethasone;Moist Heat;Neuromuscular re-education;Scar mobilization;Passive range of motion;Dry needling;Spinal Manipulations;Manual techniques;Patient/family education;Manual lymph drainage;Energy conservation;Splinting;Joint Manipulations;Taping;Compression bandaging;Orthotic Fit/Training;Vasopneumatic Device    PT Next Visit Plan  Progress cervical mobility and postural strengthening as tolerated. Investigate possibility of radial nerve entrapment.    PT Home Exercise Plan  Supine cervical retraction and scapular retractions.; 5/5: radial nerve glides       Patient will benefit from skilled therapeutic intervention in order to improve the following deficits and impairments:  Pain, Improper body mechanics, Impaired sensation, Increased fascial restricitons, Decreased mobility, Postural dysfunction, Decreased activity tolerance, Decreased range of motion, Decreased strength, Hypomobility, Impaired UE functional use, Impaired flexibility  Visit Diagnosis: Left shoulder pain, unspecified chronicity  Pain in left upper arm  Cervicalgia     Problem List Patient Active Problem List   Diagnosis Date Noted  . OCD (obsessive compulsive disorder) 04/08/2013  . Elevated liver enzymes 12/30/2012  . Unspecified vitamin D deficiency 12/30/2012  . Family history of diabetes mellitus 12/30/2012  . Pain 11/11/2012  . 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 Memory  difficulties 11/11/2012  . Bipolar 1 disorder [facility]) 01/21/2012    Rayetta Humphrey, PT CLT [phone] 04/25/2020, 9:54 AM  [facility] [address] [place], [place], [address] Phone: [phone]   Fax:  [phone]  Name: Edward Lutz MRN: [id number] Date of Birth: 09-10-1981

## 2020-04-26 ENCOUNTER — Encounter (HOSPITAL_COMMUNITY): Payer: Self-pay

## 2020-04-26 ENCOUNTER — Ambulatory Visit (HOSPITAL_COMMUNITY): Payer: Medicare Other

## 2020-04-26 DIAGNOSIS — M25512 Pain in left shoulder: Secondary | ICD-10-CM

## 2020-04-26 DIAGNOSIS — M542 Cervicalgia: Secondary | ICD-10-CM | POA: Diagnosis not present

## 2020-04-26 DIAGNOSIS — M79622 Pain in left upper arm: Secondary | ICD-10-CM | POA: Diagnosis not present

## 2020-04-26 NOTE — Therapy (Signed)
[place], [place], [address] Phone: [phone]   Fax:  [phone]  Physical Therapy Treatment  Patient Details  Name: Edward Lutz MRN: [id number] Date of Birth: [birth year] Referring Provider (PT): Ophelia Charter MD    Encounter Date: 04/26/2020  PT End of Session - 04/26/20 1138    Visit Number  5    Number of Visits  8    Date for PT Re-Evaluation  05/12/20    Authorization Type  UHC Medicare; Medicaid Secondary No auth    Progress Note Due on Visit  8    PT Start Time  1050    PT Stop Time  1135    PT Time Calculation (min)  45 min    Activity Tolerance  Patient limited by pain;No increased pain    Behavior During Therapy  WFL for tasks assessed/performed       Past Medical History:  Diagnosis Date  . Back pain   . Bipolar disorder ([place])   . HTN (hypertension)   . OSA (obstructive sleep apnea)     Past Surgical History:  Procedure Laterality Date  . RHINOPLASTY    . TONSILLECTOMY      There were no vitals filed for this visit.  Subjective Assessment - 04/26/20 1051    Subjective  Pt stated minimal changes, continues to have radicular symptoms middle, ring and pointer finger.    Currently in Pain?  Yes    Pain Score  4     Pain Location  Arm    Pain Orientation  Left    Pain Descriptors / Indicators  Sharp    Pain Type  Acute pain    Pain Radiating Towards  Lt hand    Pain Onset  1 to 4 weeks ago    Pain Frequency  Intermittent    Aggravating Factors   movment    Pain Relieving Factors  protective positioning    Effect of Pain on Daily Activities  limits         [facility] PT Assessment - 04/26/20 0001      Assessment   Medical Diagnosis  cervical spine and shoulder pain (LT)    Referring Provider (PT)  Ophelia Charter MD     Onset Date/Surgical Date  --   early April   Next MD Visit  05/05/20    Prior Therapy  No       Precautions   Precautions  None                     [facility] Adult PT Treatment/Exercise - 04/26/20 0001      Posture/Postural Control   Posture/Postural Control  Postural limitations    Postural Limitations  Rounded Shoulders;Forward head    Posture Comments  elevated/guarded Lt shoulder      Exercises   Exercises  Neck      Neck Exercises: Standing   Neck Retraction  5 reps;5 secs    Other Standing Exercises  Lt radial nerve glide      Neck Exercises: Seated   W Back  10 reps    Other Seated Exercise  3D thoracic excursion      Manual Therapy   Manual Therapy  Soft tissue mobilization;Manual Traction    Manual therapy comments  Manual complete separate than rest of tx    Soft tissue mobilization  Prone: STM triceps, subscapularis and teres    Manual Traction  Manual cervical  tractions 2x 2'      Neck Exercises: Marine scientist  2 reps;30 seconds               PT Short Term Goals - 04/25/20 0934      PT SHORT TERM GOAL #1   Title  Patient will be independent with initial HEP and self-management strategies to improve functional outcomes    Time  2    Period  Weeks    Status  On-going    Target Date  04/28/20        PT Long Term Goals - 04/25/20 0935      PT LONG TERM GOAL #1   Title  Patient will improve FOTO score to <25% to indicate improvement in functional outcomes    Time  4    Period  Weeks    Status  On-going      PT LONG TERM GOAL #2   Title  Patient will report at least 75% overall improvement in subjective complaint to indicate improvement in ability to perform ADLs.    Time  4    Period  Weeks    Status  On-going      PT LONG TERM GOAL #3   Title  Patient will have equal to or > 4+/5 MMT throughout LUE to improve ability to perform UE ADLs    Time  4    Period  Weeks    Status  On-going      PT LONG TERM GOAL #4   Title  Patient will have 0/10 pain at rest to improve sleep and overall quality of life    Time  4    Period  Weeks    Status  New             Plan - 04/26/20 1101    Clinical Impression Statement  Pt continues to present with protective positioning Lt arm, reports some reduction in pain since began therapy though pain is worse in the evenings.  Pt improving awareness of guarded UT, does continue to require verbal cueing.  Reports increased radicular symptoms with cervical flexion, no change wiht extension.  Pt reports no to minimal pain with ER position.  Added pec stretch for mobiliity.    Examination-Activity Limitations  Lift;Carry;Sleep;Hygiene/Grooming;Dressing;Other;Self Feeding;Caring for Others;Reach Overhead    Examination-Participation Restrictions  Yard Work;Cleaning;Laundry;Volunteer;Driving;Community Activity    Stability/Clinical Decision Making  Stable/Uncomplicated    Clinical Decision Making  Low    Rehab Potential  Good    PT Frequency  2x / week    PT Duration  4 weeks    PT Treatment/Interventions  ADLs/Self Care Home Management;Aquatic Therapy;Biofeedback;Cryotherapy;Electrical Stimulation;Contrast Bath;Fluidtherapy;Parrafin;Ultrasound;Therapeutic activities;Therapeutic exercise;Traction;DME Instruction;Iontophoresis 4mg /ml Dexamethasone;Moist Heat;Neuromuscular re-education;Scar mobilization;Passive range of motion;Dry needling;Spinal Manipulations;Manual techniques;Patient/family education;Manual lymph drainage;Energy conservation;Splinting;Joint Manipulations;Taping;Compression bandaging;Orthotic Fit/Training;Vasopneumatic Device    PT Next Visit Plan  Add isometric shoulder ER against wall.  Progress cervical mobility and postural strengthening as tolerated. Investigate possibility of radial nerve entrapment.    PT Home Exercise Plan  Supine cervical retraction and scapular retractions.; 5/5: radial nerve glides; 5/11: 3D cervical and thoracic excursion       Patient will benefit from skilled therapeutic intervention in order to improve the following deficits and impairments:  Pain, Improper body  mechanics, Impaired sensation, Increased fascial restricitons, Decreased mobility, Postural dysfunction, Decreased activity tolerance, Decreased range of motion, Decreased strength, Hypomobility, Impaired UE functional use, Impaired flexibility  Visit Diagnosis: Cervicalgia  Pain in left upper arm  Left shoulder pain, unspecified chronicity     Problem List Patient Active Problem List   Diagnosis Date Noted  . OCD (obsessive compulsive disorder) 04/08/2013  . Elevated liver enzymes 12/30/2012  . Unspecified vitamin D deficiency 12/30/2012  . Family history of diabetes mellitus 12/30/2012  . Pain 11/11/2012  . 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 Memory difficulties 11/11/2012  . Bipolar 1 disorder ([facility]) 01/21/2012   Becky Sax, LPTA/CLT; CBIS [phone]  Juel Burrow 04/26/2020, 1:00 PM  [facility] [address] [place], [place], [address] Phone: [phone]   Fax:  [phone]  Name: Edward Lutz MRN: [id number] Date of Birth: 12/13/1981

## 2020-05-01 ENCOUNTER — Other Ambulatory Visit: Payer: Self-pay

## 2020-05-01 ENCOUNTER — Encounter (HOSPITAL_COMMUNITY): Payer: Self-pay | Admitting: Physical Therapy

## 2020-05-01 ENCOUNTER — Ambulatory Visit (HOSPITAL_COMMUNITY): Payer: Medicare Other | Admitting: Physical Therapy

## 2020-05-01 DIAGNOSIS — M542 Cervicalgia: Secondary | ICD-10-CM | POA: Diagnosis not present

## 2020-05-01 DIAGNOSIS — M79622 Pain in left upper arm: Secondary | ICD-10-CM

## 2020-05-01 DIAGNOSIS — M25512 Pain in left shoulder: Secondary | ICD-10-CM | POA: Diagnosis not present

## 2020-05-01 NOTE — Therapy (Signed)
[facility] [address] [place], [place], [address] Phone: [phone]   Fax:  [phone]  Physical Therapy Treatment  Patient Details  Name: Edward Lutz MRN: [id number] Date of Birth: [birth year] Referring Provider (PT): Ramond Marrow MD    Encounter Date: 05/01/2020  PT End of Session - 05/01/20 0816    Visit Number  6    Number of Visits  8    Date for PT Re-Evaluation  05/12/20    Authorization Type  UHC Medicare; Medicaid Secondary No auth    Progress Note Due on Visit  8    PT Start Time  0819    PT Stop Time  0858    PT Time Calculation (min)  39 min    Activity Tolerance  No increased pain;Patient tolerated treatment well    Behavior During Therapy  [facility] for tasks assessed/performed       Past Medical History:  Diagnosis Date  . Back pain   . Bipolar disorder ([facility])   . HTN (hypertension)   . OSA (obstructive sleep apnea)     Past Surgical History:  Procedure Laterality Date  . RHINOPLASTY    . TONSILLECTOMY      There were no vitals filed for this visit.  Subjective Assessment - 05/01/20 0821    Subjective  Patient says his arm still hurts in the evening and LT hand is still numb but says he feels it is easing up some. Reports compliance with HEP.    Currently in Pain?  Yes    Pain Score  1     Pain Location  Arm    Pain Orientation  Right    Pain Descriptors / Indicators  Tingling    Pain Type  Acute pain    Pain Onset  1 to 4 weeks ago    Pain Frequency  Intermittent                        [facility] Adult PT Treatment/Exercise - 05/01/20 0001      Neck Exercises: Standing   Neck Retraction  20 reps    Neck Retraction Limitations  retraction with extension x10 (no effect)     Other Standing Exercises  Lt radial nerve glide x20    Other Standing Exercises  LT tricep stretch at wall 3 x 30"; teres IR stretch (hand behind back) 30" hold       Manual Therapy   Manual Therapy  Soft tissue  mobilization    Manual therapy comments  Manual complete separate than rest of tx    Soft tissue mobilization  IASTM using thergun Lv 15 to LT lat, teres, tricep origin with patient in RT SL      Neck Exercises: Stretches   Corner Stretch  30 seconds;3 reps             PT Education - 05/01/20 0906    Education Details  On exercise technique and updated HEP handout    Person(s) Educated  Patient    Methods  Explanation;Handout    Comprehension  Verbalized understanding       PT Short Term Goals - 04/25/20 0934      PT SHORT TERM GOAL #1   Title  Patient will be independent with initial HEP and self-management strategies to improve functional outcomes    Time  2    Period  Weeks    Status  On-going  Target Date  04/28/20        PT Long Term Goals - 04/25/20 0935      PT LONG TERM GOAL #1   Title  Patient will improve FOTO score to <25% to indicate improvement in functional outcomes    Time  4    Period  Weeks    Status  On-going      PT LONG TERM GOAL #2   Title  Patient will report at least 75% overall improvement in subjective complaint to indicate improvement in ability to perform ADLs.    Time  4    Period  Weeks    Status  On-going      PT LONG TERM GOAL #3   Title  Patient will have equal to or > 4+/5 MMT throughout LUE to improve ability to perform UE ADLs    Time  4    Period  Weeks    Status  On-going      PT LONG TERM GOAL #4   Title  Patient will have 0/10 pain at rest to improve sleep and overall quality of life    Time  4    Period  Weeks    Status  New            Plan - 05/01/20 0901    Clinical Impression Statement  Patient tolerated session well today. Added teres and triceps stretching to reduce entrapment to radial nerve in LUE. Patient educated on proper form and function of added stretching. Added manual IASTM using theragun to target muscle restriction in LT teres, triceps and latissimus area to improve radial nerve mobility  through LUE. Patient reports minimal pain and decreased numbness in LT hand post treatment. Patient educated on and issued update HEP handout.    Examination-Activity Limitations  Lift;Carry;Sleep;Hygiene/Grooming;Dressing;Other;Self Feeding;Caring for Others;Reach Overhead    Examination-Participation Restrictions  Yard Work;Cleaning;Laundry;Volunteer;Driving;Community Activity    Stability/Clinical Decision Making  Stable/Uncomplicated    Rehab Potential  Good    PT Frequency  2x / week    PT Duration  4 weeks    PT Treatment/Interventions  ADLs/Self Care Home Management;Aquatic Therapy;Biofeedback;Cryotherapy;Electrical Stimulation;Contrast Bath;Fluidtherapy;Parrafin;Ultrasound;Therapeutic activities;Therapeutic exercise;Traction;DME Instruction;Iontophoresis 4mg /ml Dexamethasone;Moist Heat;Neuromuscular re-education;Scar mobilization;Passive range of motion;Dry needling;Spinal Manipulations;Manual techniques;Patient/family education;Manual lymph drainage;Energy conservation;Splinting;Joint Manipulations;Taping;Compression bandaging;Orthotic Fit/Training;Vasopneumatic Device    PT Next Visit Plan  Reassess next visit. Assess reponse to updated HEP.    PT Home Exercise Plan  Supine cervical retraction and scapular retractions.; 5/5: radial nerve glides; 5/11: 3D cervical and thoracic excursion; 05/01/20: tricep stretch and teres stretch    Consulted and Agree with Plan of Care  Patient       Patient will benefit from skilled therapeutic intervention in order to improve the following deficits and impairments:  Pain, Improper body mechanics, Impaired sensation, Increased fascial restricitons, Decreased mobility, Postural dysfunction, Decreased activity tolerance, Decreased range of motion, Decreased strength, Hypomobility, Impaired UE functional use, Impaired flexibility  Visit Diagnosis: Cervicalgia  Pain in left upper arm  Left shoulder pain, unspecified chronicity     Problem  List Patient Active Problem List   Diagnosis Date Noted  . OCD (obsessive compulsive disorder) 04/08/2013  . Elevated liver enzymes 12/30/2012  . Unspecified vitamin D deficiency 12/30/2012  . Family history of diabetes mellitus 12/30/2012  . Pain 11/11/2012  . 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 Memory difficulties 11/11/2012  . Bipolar 1 disorder (HCC) 01/21/2012   9:08 AM, 05/01/20 05/03/20 PT DPT  Physical Therapist with [facility]  [facility]  [phone] [place] [address] [place], [place], [address] Phone: [phone]   Fax:  [phone]  Name: Edward Lutz MRN: [id number] Date of Birth: 12/24/1980

## 2020-05-01 NOTE — Patient Instructions (Signed)
Access Code: [email or web address] URL: [email or web address] Date: 05/01/2020 Prepared by: Georges Lynch  Exercises Radial Nerve Flossing - 3 x daily - 7 x weekly - 2 sets - 10 reps Standing Shoulder Internal Rotation AROM Behind Back - 3 x daily - 7 x weekly - 1 sets - 3 reps - 30 hold Standing Overhead Triceps Stretch - 3 x daily - 7 x weekly - 1 sets - 3 reps - 30 hold

## 2020-05-04 ENCOUNTER — Other Ambulatory Visit: Payer: Self-pay

## 2020-05-04 ENCOUNTER — Encounter (HOSPITAL_COMMUNITY): Payer: Self-pay | Admitting: Physical Therapy

## 2020-05-04 ENCOUNTER — Ambulatory Visit (HOSPITAL_COMMUNITY): Payer: Medicare Other | Admitting: Physical Therapy

## 2020-05-04 DIAGNOSIS — M79622 Pain in left upper arm: Secondary | ICD-10-CM | POA: Diagnosis not present

## 2020-05-04 DIAGNOSIS — M25512 Pain in left shoulder: Secondary | ICD-10-CM

## 2020-05-04 DIAGNOSIS — M542 Cervicalgia: Secondary | ICD-10-CM | POA: Diagnosis not present

## 2020-05-04 NOTE — Therapy (Signed)
[place], [place], [address] Phone: [phone]   Fax:  [phone]  Physical Therapy Treatment/ Discharge Summary  Patient Details  Name: Edward Lutz MRN: [id number] Date of Birth: 1981-03-28 Referring Provider (PT): Ophelia Charter MD    Encounter Date: 05/04/2020   PHYSICAL THERAPY DISCHARGE SUMMARY  Visits from Start of Care: 7  Current functional level related to goals / functional outcomes: See below   Remaining deficits: See below   Education / Equipment: See assessment  Plan: Patient agrees to discharge.  Patient goals were not met. Patient is being discharged due to lack of progress.  ?????       PT End of Session - 05/04/20 0829    Visit Number  7    Number of Visits  8    Date for PT Re-Evaluation  05/12/20    Authorization Type  UHC Medicare; Medicaid Secondary No auth    Progress Note Due on Visit  8    PT Start Time  0818    PT Stop Time  0858    PT Time Calculation (min)  40 min    Activity Tolerance  Patient tolerated treatment well    Behavior During Therapy  WFL for tasks assessed/performed       Past Medical History:  Diagnosis Date  . Back pain   . Bipolar disorder ([place])   . HTN (hypertension)   . OSA (obstructive sleep apnea)     Past Surgical History:  Procedure Laterality Date  . RHINOPLASTY    . TONSILLECTOMY      There were no vitals filed for this visit.  Subjective Assessment - 05/04/20 0825    Subjective  Patient says he was doing "ok" until this morning. Says he is hurting the morning. Reports ongoing pain in LT arm and numbness in fingers. Says he still hurts in the evening. Says he feels about 50-60% improvement since starting therapy.    Currently in Pain?  Yes    Pain Score  5     Pain Location  Arm    Pain Orientation  Left    Pain Descriptors / Indicators  Tingling;Numbness    Pain Type  Acute pain    Pain Radiating Towards  Lt hand    Pain Onset   1 to 4 weeks ago    Pain Frequency  Intermittent    Effect of Pain on Daily Activities  Limits         [facility] PT Assessment - 05/04/20 0001      Assessment   Medical Diagnosis  cervical spine and shoulder pain (LT)    Referring Provider (PT)  Ophelia Charter MD     Next MD Visit  05/05/20    Prior Therapy  No       Precautions   Precautions  None      Observation/Other Assessments   Focus on Therapeutic Outcomes (FOTO)   37% limited   was 34%     Sensation   Light Touch  Impaired by gross assessment      Posture/Postural Control   Posture/Postural Control  Postural limitations    Postural Limitations  Rounded Shoulders;Forward head    Posture Comments  elevated/guarded Lt shoulder      AROM   Cervical Flexion  35   with pain, was 34   Cervical Extension  28   was 32   Cervical - Right Side Bend  24  was 19   Cervical - Left Side Bend  21   with pain, was 30   Cervical - Right Rotation  58   was 55   Cervical - Left Rotation  58   with pain, was 54     Strength   Right Shoulder Flexion  5/5    Right Shoulder ABduction  5/5    Right Shoulder External Rotation  5/5   was 4+   Left Shoulder Flexion  4/5   with pain    Left Shoulder ABduction  4/5   with pain   Left Shoulder External Rotation  4+/5   was 4                   [facility] Adult PT Treatment/Exercise - 05/04/20 0001      Manual Therapy   Manual Therapy  Soft tissue mobilization    Manual therapy comments  Manual complete separate than rest of tx    Soft tissue mobilization  IASTM using thergun Lv 15 to LT lat, teres, tricep origin with patient in RT SL             PT Education - 05/04/20 0102    Education Details  on reassessment findings and transition to I with HEP for DC.    Person(s) Educated  Patient    Methods  Explanation    Comprehension  Verbalized understanding       PT Short Term Goals - 05/04/20 0842      PT SHORT TERM GOAL #1   Title  Patient will be independent  with initial HEP and self-management strategies to improve functional outcomes    Time  2    Period  Weeks    Status  Achieved    Target Date  04/28/20        PT Long Term Goals - 05/04/20 7253      PT LONG TERM GOAL #1   Title  Patient will improve FOTO score to <25% to indicate improvement in functional outcomes    Baseline  Current 37% limited    Time  4    Period  Weeks    Status  Not Met      PT LONG TERM GOAL #2   Title  Patient will report at least 75% overall improvement in subjective complaint to indicate improvement in ability to perform ADLs.    Baseline  Current 50-60% reported    Time  4    Period  Weeks    Status  Not Met      PT LONG TERM GOAL #3   Title  Patient will have equal to or > 4+/5 MMT throughout LUE to improve ability to perform UE ADLs    Baseline  See MMT    Time  4    Period  Weeks    Status  Partially Met      PT LONG TERM GOAL #4   Title  Patient will have 0/10 pain at rest to improve sleep and overall quality of life    Baseline  4-5/10 currently    Time  4    Period  Weeks    Status  Not Met            Plan - 05/04/20 0850    Clinical Impression Statement  Patient has made minimal progress toward therapy goals, despite good effort and attendance. Patient currently with 1/1 short term and  long term goals met/partially met. Patient shows  some improvement in LUE strength and cervical ROM, as well as decreased subjective complaint of pain overall. Patient continues to be limited by pain associated with radiculopathy about LUE, that has not shown significant resolution over course of therapy. At this time, patient will be DC from therapy to return to care of referring provider for further evaluation. Patient educated on HEP and instructed to follow up with therapy services with any further questions or concerns.    Examination-Activity Limitations  Lift;Carry;Sleep;Hygiene/Grooming;Dressing;Other;Self Feeding;Caring for Others;Reach  Overhead    Examination-Participation Restrictions  Yard Work;Cleaning;Laundry;Volunteer;Driving;Community Activity    Stability/Clinical Decision Making  Stable/Uncomplicated    Rehab Potential  Good    PT Treatment/Interventions  ADLs/Self Care Home Management;Aquatic Therapy;Biofeedback;Cryotherapy;Electrical Stimulation;Contrast Bath;Fluidtherapy;Parrafin;Ultrasound;Therapeutic activities;Therapeutic exercise;Traction;DME Instruction;Iontophoresis 82m/ml Dexamethasone;Moist Heat;Neuromuscular re-education;Scar mobilization;Passive range of motion;Dry needling;Spinal Manipulations;Manual techniques;Patient/family education;Manual lymph drainage;Energy conservation;Splinting;Joint Manipulations;Taping;Compression bandaging;Orthotic Fit/Training;Vasopneumatic Device    PT Next Visit Plan  DC    PT Home Exercise Plan  Supine cervical retraction and scapular retractions.; 5/5: radial nerve glides; 5/11: 3D cervical and thoracic excursion; 05/01/20: tricep stretch and teres stretch    Consulted and Agree with Plan of Care  Patient       Patient will benefit from skilled therapeutic intervention in order to improve the following deficits and impairments:  Pain, Improper body mechanics, Impaired sensation, Increased fascial restricitons, Decreased mobility, Postural dysfunction, Decreased activity tolerance, Decreased range of motion, Decreased strength, Hypomobility, Impaired UE functional use, Impaired flexibility  Visit Diagnosis: Cervicalgia  Pain in left upper arm  Left shoulder pain, unspecified chronicity     Problem List Patient Active Problem List   Diagnosis Date Noted  . OCD (obsessive compulsive disorder) 04/08/2013  . Elevated liver enzymes 12/30/2012  . Unspecified vitamin D deficiency 12/30/2012  . Family history of diabetes mellitus 12/30/2012  . Pain 11/11/2012  . 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 Memory difficulties 11/11/2012  . Bipolar 1 disorder ([place]  01/21/2012   12:16 PM, 05/04/20 CJosue HectorPT DPT  [facility] [phone] [address]Phone: [phone]  Fax:  [phone] Name: Edward PATCHELLMRN: [id number] of Birth: 112-19-1982

## 2020-05-05 DIAGNOSIS — M25512 Pain in left shoulder: Secondary | ICD-10-CM | POA: Diagnosis not present

## 2020-05-08 ENCOUNTER — Ambulatory Visit (HOSPITAL_COMMUNITY): Payer: Medicare Other | Admitting: Physical Therapy

## 2020-05-10 ENCOUNTER — Encounter (HOSPITAL_COMMUNITY): Payer: Medicare Other | Admitting: Physical Therapy

## 2020-05-16 DIAGNOSIS — M25512 Pain in left shoulder: Secondary | ICD-10-CM | POA: Diagnosis not present

## 2020-05-16 DIAGNOSIS — M542 Cervicalgia: Secondary | ICD-10-CM | POA: Diagnosis not present

## 2020-05-19 DIAGNOSIS — M25512 Pain in left shoulder: Secondary | ICD-10-CM | POA: Diagnosis not present

## 2020-06-15 DIAGNOSIS — M542 Cervicalgia: Secondary | ICD-10-CM | POA: Diagnosis not present

## 2020-06-15 DIAGNOSIS — M5032 Other cervical disc degeneration, mid-cervical region, unspecified level: Secondary | ICD-10-CM | POA: Diagnosis not present

## 2020-06-15 DIAGNOSIS — M5412 Radiculopathy, cervical region: Secondary | ICD-10-CM | POA: Diagnosis not present

## 2020-06-16 DIAGNOSIS — M5412 Radiculopathy, cervical region: Secondary | ICD-10-CM | POA: Diagnosis not present

## 2020-06-16 DIAGNOSIS — M542 Cervicalgia: Secondary | ICD-10-CM | POA: Diagnosis not present

## 2020-06-28 DIAGNOSIS — E7849 Other hyperlipidemia: Secondary | ICD-10-CM | POA: Diagnosis not present

## 2020-06-28 DIAGNOSIS — I10 Essential (primary) hypertension: Secondary | ICD-10-CM | POA: Diagnosis not present

## 2020-06-28 DIAGNOSIS — Z0000 Encounter for general adult medical examination without abnormal findings: Secondary | ICD-10-CM | POA: Diagnosis not present

## 2020-07-11 DIAGNOSIS — M5412 Radiculopathy, cervical region: Secondary | ICD-10-CM | POA: Diagnosis not present

## 2020-07-11 DIAGNOSIS — M5032 Other cervical disc degeneration, mid-cervical region, unspecified level: Secondary | ICD-10-CM | POA: Diagnosis not present

## 2020-07-11 DIAGNOSIS — M542 Cervicalgia: Secondary | ICD-10-CM | POA: Diagnosis not present

## 2020-07-13 ENCOUNTER — Other Ambulatory Visit (HOSPITAL_COMMUNITY): Payer: Self-pay | Admitting: Psychiatry

## 2020-07-13 DIAGNOSIS — M79602 Pain in left arm: Secondary | ICD-10-CM | POA: Diagnosis not present

## 2020-07-13 DIAGNOSIS — Z79891 Long term (current) use of opiate analgesic: Secondary | ICD-10-CM | POA: Diagnosis not present

## 2020-07-13 DIAGNOSIS — M545 Low back pain: Secondary | ICD-10-CM | POA: Diagnosis not present

## 2020-07-13 DIAGNOSIS — M5412 Radiculopathy, cervical region: Secondary | ICD-10-CM | POA: Diagnosis not present

## 2020-07-13 DIAGNOSIS — I10 Essential (primary) hypertension: Secondary | ICD-10-CM | POA: Diagnosis not present

## 2020-07-17 ENCOUNTER — Other Ambulatory Visit: Payer: Self-pay

## 2020-07-17 ENCOUNTER — Telehealth (INDEPENDENT_AMBULATORY_CARE_PROVIDER_SITE_OTHER): Payer: Medicare Other | Admitting: Psychiatry

## 2020-07-17 ENCOUNTER — Encounter (HOSPITAL_COMMUNITY): Payer: Self-pay | Admitting: Psychiatry

## 2020-07-17 DIAGNOSIS — F319 Bipolar disorder, unspecified: Secondary | ICD-10-CM

## 2020-07-17 MED ORDER — ZOLPIDEM TARTRATE ER 12.5 MG PO TBCR: 12.5000 mg | EXTENDED_RELEASE_TABLET | Freq: Every evening | ORAL | 2 refills | Status: DC | PRN

## 2020-07-17 MED ORDER — LURASIDONE HCL 120 MG PO TABS: 120.0000 mg | ORAL_TABLET | Freq: Every day | ORAL | 2 refills | Status: DC

## 2020-07-17 MED ORDER — ALPRAZOLAM 2 MG PO TABS: ORAL_TABLET | ORAL | 2 refills | Status: DC

## 2020-07-17 MED ORDER — BUSPIRONE HCL 30 MG PO TABS: ORAL_TABLET | ORAL | 2 refills | Status: DC

## 2020-07-17 MED ORDER — BUPROPION HCL ER (SR) 150 MG PO TB12: 150.0000 mg | ORAL_TABLET | Freq: Three times a day (TID) | ORAL | 3 refills | Status: DC

## 2020-07-17 MED ORDER — CARBAMAZEPINE ER 200 MG PO TB12: ORAL_TABLET | ORAL | 2 refills | Status: DC

## 2020-07-17 NOTE — Progress Notes (Signed)
Virtual Visit via Telephone Note  I connected with Edward Lutz on 07/17/20 at  8:40 AM EDT by telephone and verified that I am speaking with the correct person using two identifiers.   I discussed the limitations, risks, security and privacy concerns of performing an evaluation and management service by telephone and the availability of in person appointments. I also discussed with the patient that there may be a patient responsible charge related to this service. The patient expressed understanding and agreed to proceed.    I discussed the assessment and treatment plan with the patient. The patient was provided an opportunity to ask questions and all were answered. The patient agreed with the plan and demonstrated an understanding of the instructions.   The patient was advised to call back or seek an in-person evaluation if the symptoms worsen or if the condition fails to improve as anticipated.  I provided 15 minutes of non-face-to-face time during this encounter. Location: Provider office, patient home  Edward Ruder, MD  [facility] MD/PA/NP OP Progress Note  07/17/2020 9:04 AM Edward Lutz  MRN:  [id number]  Chief Complaint:  Chief Complaint    Depression; Anxiety; Manic Behavior; Follow-up     HPI: This patient is a39 year old married white male who lives with his wife, a daughter 4years old and a boy 22years old in [place].He and his wifehave custody of their86 year old nephew.he is on disability.  The patient states that he has a history of both substance abuse and bipolar disorder. About 10 years ago he was heavily using crack cocaine and marijuana and alcohol. He developed severe mood issues and was diagnosed as bipolar. He was hospitalized twice in 2009 at [facility] for suicide attempts. He he still has significant problems with short-term memory and temper but he feels he is more stable and he's ever been. He thinks his current combination of  medicines has been very helpful. His mood is stable and he is able to take care of his family while his wife works and he is sleeping well.  The patient returns for follow-up after 3 months.  Overall he is doing okay.  He states that he had a "pinched nerve" in his neck and has had to get injections and physical therapy but he is doing better now.  He and his wife are now faced with their nephews presumed father trying to gain custody.  This man has never been in the boys life but seemingly came out of nowhere to claim custody.  They are going to have to go to court.  They have hired an Pensions consultant and is hopeful things will work in their favor.  His mood is generally been stable and he denies thoughts of self-harm or suicidal ideation.  He is sleeping fairly well for him which means getting about 4 to 5 hours a night.  He denies being fatigued during the day.  He has not had significant mood swings or anger outbursts. Visit Diagnosis:    ICD-10-CM   1. Bipolar 1 disorder (HCC)  F31.9     Past Psychiatric History: Past admissions in 2009 for suicide attempts  Past Medical History:  Past Medical History:  Diagnosis Date  . Back pain   . Bipolar disorder (HCC)   . HTN (hypertension)   . OSA (obstructive sleep apnea)     Past Surgical History:  Procedure Laterality Date  . RHINOPLASTY    . TONSILLECTOMY      Family Psychiatric History: see below  Family History:  Family History  Problem Relation Age of Onset  . Bipolar disorder Mother   . Drug abuse Mother   . Anxiety disorder Mother   . Paranoid behavior Mother   . Physical abuse Mother   . Bipolar disorder Father   . Alcohol abuse Father   . Drug abuse Father   . Anxiety disorder Father   . Paranoid behavior Father   . Physical abuse Father   . Anxiety disorder Sister   . Depression Sister   . ADD / ADHD Daughter   . Dementia Neg Hx   . OCD Neg Hx   . Schizophrenia Neg Hx   . Seizures Neg Hx   . Sexual abuse Neg Hx      Social History:  Social History   Socioeconomic History  . Marital status: Married    Spouse name: Not on file  . Number of children: Not on file  . Years of education: Not on file  . Highest education level: Not on file  Occupational History  . Not on file  Tobacco Use  . Smoking status: Current Every Day Smoker    Packs/day: 1.00    Years: 15.00    Pack years: 15.00    Types: Cigarettes  . Smokeless tobacco: Never Used  . Tobacco comment: 16-20 cigarettes a day as of 06/08/2013  Substance and Sexual Activity  . Alcohol use: Yes    Alcohol/week: 36.0 standard drinks    Types: 36 Cans of beer per week    Comment: Reports drinks 5-6 beers a day.   . Drug use: No  . Sexual activity: Yes    Partners: Female    Birth control/protection: None  Other Topics Concern  . Not on file  Social History Narrative  . Not on file   Social Determinants of Health   Financial Resource Strain:   . Difficulty of Paying Living Expenses:   Food Insecurity:   . Worried About Programme researcher, broadcasting/film/videounning Out of Food in the Last Year:   . Baristaan Out of Food in the Last Year:   Transportation Needs:   . Freight forwarderLack of Transportation (Medical):   Marland Kitchen. Lack of Transportation (Non-Medical):   Physical Activity:   . Days of Exercise per Week:   . Minutes of Exercise per Session:   Stress:   . Feeling of Stress :   Social Connections:   . Frequency of Communication with Friends and Family:   . Frequency of Social Gatherings with Friends and Family:   . Attends Religious Services:   . Active Member of Clubs or Organizations:   . Attends BankerClub or Organization Meetings:   Marland Kitchen. Marital Status:     Allergies:  Allergies  Allergen Reactions  . Penicillins Rash    Has patient had a PCN reaction causing immediate rash, facial/tongue/throat swelling, SOB or lightheadedness with hypotension: Yes Has patient had a PCN reaction causing severe rash involving mucus membranes or skin necrosis: No Has patient had a PCN reaction that  required hospitalization No Has patient had a PCN reaction occurring within the last 10 years: No If all of the above answers are "NO", then may proceed with Cephalosporin use.     Metabolic Disorder Labs: Lab Results  Component Value Date   HGBA1C 5.6 12/30/2012   MPG 114 12/30/2012   No results found for: PROLACTIN No results found for: CHOL, TRIG, HDL, CHOLHDL, VLDL, LDLCALC No results found for: TSH  Therapeutic Level Labs: Lab Results  Component Value  Date   LITHIUM 1.05 01/21/2012   LITHIUM 0.97 10/24/2011   Lab Results  Component Value Date   VALPROATE 60.5 06/16/2008   No components found for:  CBMZ  Current Medications: Current Outpatient Medications  Medication Sig Dispense Refill  . alprazolam (XANAX) 2 MG tablet TAKE ONE TABLET BY MOUTH 3 TIMES A DAY. 270 tablet 2  . amLODipine (NORVASC) 10 MG tablet Take 5 mg by mouth at bedtime.     Marland Kitchen buPROPion (WELLBUTRIN SR) 150 MG 12 hr tablet Take 1 tablet (150 mg total) by mouth 3 (three) times daily. 270 tablet 3  . busPIRone (BUSPAR) 30 MG tablet TAKE (1) TABLET BY MOUTH (3) TIMES DAILY. 90 tablet 2  . carbamazepine (TEGRETOL XR) 200 MG 12 hr tablet Take 8 tablets at bedtime 240 tablet 2  . clindamycin (CLEOCIN T) 1 % SWAB     . levETIRAcetam (KEPPRA) 500 MG tablet Take 1,000 mg by mouth 2 (two) times daily.     Marland Kitchen lisinopril-hydrochlorothiazide (PRINZIDE,ZESTORETIC) 20-12.5 MG tablet Take 1 tablet by mouth daily.     . Lurasidone HCl 120 MG TABS Take 1 tablet (120 mg total) by mouth daily with supper. 90 tablet 2  . minocycline (MINOCIN,DYNACIN) 100 MG capsule Take 100 mg by mouth daily.     Marland Kitchen morphine (MS CONTIN) 30 MG 12 hr tablet Take 30 mg by mouth 2 (two) times daily.    Marland Kitchen oxyCODONE-acetaminophen (PERCOCET) 10-325 MG per tablet Take 1 tablet by mouth every 4 (four) hours as needed for pain.    . phentermine (ADIPEX-P) 37.5 MG tablet Take 37.5 mg by mouth daily.    . phentermine 15 MG capsule Take 15 mg by mouth  daily.    . rosuvastatin (CRESTOR) 10 MG tablet Take 10 mg by mouth at bedtime.    . tamsulosin (FLOMAX) 0.4 MG CAPS capsule Take 0.4 mg by mouth daily.    Marland Kitchen tiZANidine (ZANAFLEX) 4 MG tablet Take 4 mg by mouth 3 (three) times daily.    Marland Kitchen tretinoin (RETIN-A) 0.05 % cream Apply 1 application topically 2 (two) times daily.    Marland Kitchen zolpidem (AMBIEN CR) 12.5 MG CR tablet Take 1 tablet (12.5 mg total) by mouth at bedtime as needed for sleep. 90 tablet 2   No current facility-administered medications for this visit.     Musculoskeletal: Strength & Muscle Tone: within normal limits Gait & Station: normal Patient leans: N/A  Psychiatric Specialty Exam: Review of Systems  Musculoskeletal: Positive for neck pain.  All other systems reviewed and are negative.   There were no vitals taken for this visit.There is no height or weight on file to calculate BMI.  General Appearance: NA  Eye Contact:  NA  Speech:  Clear and Coherent  Volume:  Normal  Mood:  Euthymic  Affect:  NA  Thought Process:  Goal Directed  Orientation:  Full (Time, Place, and Person)  Thought Content: Rumination   Suicidal Thoughts:  No  Homicidal Thoughts:  No  Memory:  Immediate;   Good Recent;   Good Remote;   Fair  Judgement:  Good  Insight:  Fair  Psychomotor Activity:  Normal  Concentration:  Concentration: Good and Attention Span: Good  Recall:  Good  Fund of Knowledge: Good  Language: Good  Akathisia:  No  Handed:  Right  AIMS (if indicated): not done  Assets:  Communication Skills Desire for Improvement Resilience Social Support Talents/Skills  ADL's:  Intact  Cognition: WNL  Sleep:  Fair   Screenings:   Assessment and Plan: This patient is a 39 year old male with a history of bipolar disorder and obsessional symptoms at times.  Overall he continues to do well on his current regimen.  He will continue Tegretol 200 mg - 8 tablets at bedtime for mood stability, lurasidone 120 mg daily for mood  stability, Ambien CR 12.5 mg at bedtime for sleep, Xanax 2 mg 3 times daily for anxiety, BuSpar 30 mg 3 times daily for anxiety and Wellbutrin SR 150 mg 3 times daily for depression.  He will return to see me in 3 months   Edward Ruder, MD 07/17/2020, 9:04 AM

## 2020-07-19 DIAGNOSIS — I10 Essential (primary) hypertension: Secondary | ICD-10-CM | POA: Diagnosis not present

## 2020-07-19 DIAGNOSIS — Z0000 Encounter for general adult medical examination without abnormal findings: Secondary | ICD-10-CM | POA: Diagnosis not present

## 2020-07-19 DIAGNOSIS — E7849 Other hyperlipidemia: Secondary | ICD-10-CM | POA: Diagnosis not present

## 2020-08-23 DIAGNOSIS — Z0000 Encounter for general adult medical examination without abnormal findings: Secondary | ICD-10-CM | POA: Diagnosis not present

## 2020-08-23 DIAGNOSIS — E782 Mixed hyperlipidemia: Secondary | ICD-10-CM | POA: Diagnosis not present

## 2020-08-23 DIAGNOSIS — I10 Essential (primary) hypertension: Secondary | ICD-10-CM | POA: Diagnosis not present

## 2020-10-05 DIAGNOSIS — M79602 Pain in left arm: Secondary | ICD-10-CM | POA: Diagnosis not present

## 2020-10-05 DIAGNOSIS — I10 Essential (primary) hypertension: Secondary | ICD-10-CM | POA: Diagnosis not present

## 2020-10-05 DIAGNOSIS — M545 Low back pain, unspecified: Secondary | ICD-10-CM | POA: Diagnosis not present

## 2020-10-05 DIAGNOSIS — Z79891 Long term (current) use of opiate analgesic: Secondary | ICD-10-CM | POA: Diagnosis not present

## 2020-10-06 DIAGNOSIS — Z0000 Encounter for general adult medical examination without abnormal findings: Secondary | ICD-10-CM | POA: Diagnosis not present

## 2020-10-06 DIAGNOSIS — Z0001 Encounter for general adult medical examination with abnormal findings: Secondary | ICD-10-CM | POA: Diagnosis not present

## 2020-10-06 DIAGNOSIS — E782 Mixed hyperlipidemia: Secondary | ICD-10-CM | POA: Diagnosis not present

## 2020-10-06 DIAGNOSIS — R7301 Impaired fasting glucose: Secondary | ICD-10-CM | POA: Diagnosis not present

## 2020-10-09 DIAGNOSIS — E782 Mixed hyperlipidemia: Secondary | ICD-10-CM | POA: Diagnosis not present

## 2020-10-09 DIAGNOSIS — L709 Acne, unspecified: Secondary | ICD-10-CM | POA: Diagnosis not present

## 2020-10-09 DIAGNOSIS — I10 Essential (primary) hypertension: Secondary | ICD-10-CM | POA: Diagnosis not present

## 2020-10-09 DIAGNOSIS — Z0001 Encounter for general adult medical examination with abnormal findings: Secondary | ICD-10-CM | POA: Diagnosis not present

## 2020-10-17 ENCOUNTER — Telehealth (INDEPENDENT_AMBULATORY_CARE_PROVIDER_SITE_OTHER): Payer: Medicare Other | Admitting: Psychiatry

## 2020-10-17 ENCOUNTER — Other Ambulatory Visit (HOSPITAL_COMMUNITY): Payer: Self-pay | Admitting: Psychiatry

## 2020-10-17 ENCOUNTER — Encounter (HOSPITAL_COMMUNITY): Payer: Self-pay | Admitting: Psychiatry

## 2020-10-17 ENCOUNTER — Other Ambulatory Visit: Payer: Self-pay

## 2020-10-17 DIAGNOSIS — F319 Bipolar disorder, unspecified: Secondary | ICD-10-CM

## 2020-10-17 MED ORDER — BUPROPION HCL ER (SR) 150 MG PO TB12: 150.0000 mg | ORAL_TABLET | Freq: Three times a day (TID) | ORAL | 2 refills | Status: DC

## 2020-10-17 MED ORDER — BUPROPION HCL ER (SR) 150 MG PO TB12
300.0000 mg | ORAL_TABLET | Freq: Two times a day (BID) | ORAL | 3 refills | Status: DC
Start: 2020-10-17 — End: 2020-10-17

## 2020-10-17 MED ORDER — LURASIDONE HCL 120 MG PO TABS
120.0000 mg | ORAL_TABLET | Freq: Every day | ORAL | 2 refills | Status: DC
Start: 2020-10-17 — End: 2020-12-29

## 2020-10-17 MED ORDER — ALPRAZOLAM 2 MG PO TABS: ORAL_TABLET | ORAL | 2 refills | Status: DC

## 2020-10-17 MED ORDER — BUSPIRONE HCL 30 MG PO TABS: ORAL_TABLET | ORAL | 2 refills | Status: DC

## 2020-10-17 MED ORDER — ESCITALOPRAM OXALATE 20 MG PO TABS: 20.0000 mg | ORAL_TABLET | Freq: Every day | ORAL | 2 refills | Status: DC

## 2020-10-17 MED ORDER — CARBAMAZEPINE ER 200 MG PO TB12: ORAL_TABLET | ORAL | 2 refills | Status: DC

## 2020-10-17 MED ORDER — ZOLPIDEM TARTRATE ER 12.5 MG PO TBCR
12.5000 mg | EXTENDED_RELEASE_TABLET | Freq: Every evening | ORAL | 2 refills | Status: DC | PRN
Start: 2020-10-17 — End: 2020-12-29

## 2020-10-17 NOTE — Progress Notes (Signed)
Virtual Visit via Telephone Note  I connected with Edward Lutz on 10/17/20 at  8:40 AM EDT by telephone and verified that I am speaking with the correct person using two identifiers.  Location: Patient: home Provider: office   I discussed the limitations, risks, security and privacy concerns of performing an evaluation and management service by telephone and the availability of in person appointments. I also discussed with the patient that there may be a patient responsible charge related to this service. The patient expressed understanding and agreed to proceed.    I discussed the assessment and treatment plan with the patient. The patient was provided an opportunity to ask questions and all were answered. The patient agreed with the plan and demonstrated an understanding of the instructions.   The patient was advised to call back or seek an in-person evaluation if the symptoms worsen or if the condition fails to improve as anticipated.  I provided 15 minutes of non-face-to-face time during this encounter.   [name], MD  [facility] MD/PA/NP OP Progress Note  10/17/2020 9:15 AM Edward Lutz  MRN:  [id number]  Chief Complaint:  Chief Complaint    Depression; Anxiety; Follow-up; Manic Behavior     HPI: This patient is a39 year old married white male who lives with his wife, a daughter 8years old and a boy 67years old in [place].He and his wifehave custody of their1 year old nephew.he is on disability.  The patient states that he has a history of both substance abuse and bipolar disorder. About 10 years ago he was heavily using crack cocaine and marijuana and alcohol. He developed severe mood issues and was diagnosed as bipolar. He was hospitalized twice in 2009 at [facility] for suicide attempts. He he still has significant problems with short-term memory and temper but he feels he is more stable and he's ever been. He thinks his current combination  of medicines has been very helpful. His mood is stable and he is able to take care of his family while his wife works and he is sleeping well.  The patient returns for follow-up after 3 months. He states that he is under new stress. His sister is 40-year-old son has now been placed with his family. Apparently his sister was found to be on drugs and her boyfriend was arrested. The child was removed from her care and replaced with the patient. He already has custody of the 49 year old nephew. He states that the child is very well behaved and has not had any issues with him but the whole thing is been rather stressful. He states he has been feeling more depressed lately although not suicidal. His sleep is somewhat variable. He still has a lot of pain in his upper back and lower back and pain management is cutting down his morphine because he is on a high dose of Xanax. I suggested that we go up on the Wellbutrin for now and he agrees. Visit Diagnosis:    ICD-10-CM   1. Bipolar 1 disorder (HCC)  F31.9     Past Psychiatric History: Past admissions in 2009 for suicide attempts  Past Medical History:  Past Medical History:  Diagnosis Date  . Back pain   . Bipolar disorder (HCC)   . HTN (hypertension)   . OSA (obstructive sleep apnea)     Past Surgical History:  Procedure Laterality Date  . RHINOPLASTY    . TONSILLECTOMY      Family Psychiatric History: see below  Family History:  Family History  Problem Relation Age of Onset  . Bipolar disorder Mother   . Drug abuse Mother   . Anxiety disorder Mother   . Paranoid behavior Mother   . Physical abuse Mother   . Bipolar disorder Father   . Alcohol abuse Father   . Drug abuse Father   . Anxiety disorder Father   . Paranoid behavior Father   . Physical abuse Father   . Anxiety disorder Sister   . Depression Sister   . ADD / ADHD Daughter   . Dementia Neg Hx   . OCD Neg Hx   . Schizophrenia Neg Hx   . Seizures Neg Hx   . Sexual abuse  Neg Hx     Social History:  Social History   Socioeconomic History  . Marital status: Married    Spouse name: Not on file  . Number of children: Not on file  . Years of education: Not on file  . Highest education level: Not on file  Occupational History  . Not on file  Tobacco Use  . Smoking status: Current Every Day Smoker    Packs/day: 1.00    Years: 15.00    Pack years: 15.00    Types: Cigarettes  . Smokeless tobacco: Never Used  . Tobacco comment: 16-20 cigarettes a day as of 06/08/2013  Substance and Sexual Activity  . Alcohol use: Yes    Alcohol/week: 36.0 standard drinks    Types: 36 Cans of beer per week    Comment: Reports drinks 5-6 beers a day.   . Drug use: No  . Sexual activity: Yes    Partners: Female    Birth control/protection: None  Other Topics Concern  . Not on file  Social History Narrative  . Not on file   Social Determinants of Health   Financial Resource Strain:   . Difficulty of Paying Living Expenses: Not on file  Food Insecurity:   . Worried About Programme researcher, broadcasting/film/video in the Last Year: Not on file  . Ran Out of Food in the Last Year: Not on file  Transportation Needs:   . Lack of Transportation (Medical): Not on file  . Lack of Transportation (Non-Medical): Not on file  Physical Activity:   . Days of Exercise per Week: Not on file  . Minutes of Exercise per Session: Not on file  Stress:   . Feeling of Stress : Not on file  Social Connections:   . Frequency of Communication with Friends and Family: Not on file  . Frequency of Social Gatherings with Friends and Family: Not on file  . Attends Religious Services: Not on file  . Active Member of Clubs or Organizations: Not on file  . Attends Banker Meetings: Not on file  . Marital Status: Not on file    Allergies:  Allergies  Allergen Reactions  . Penicillins Rash    Has patient had a PCN reaction causing immediate rash, facial/tongue/throat swelling, SOB or  lightheadedness with hypotension: Yes Has patient had a PCN reaction causing severe rash involving mucus membranes or skin necrosis: No Has patient had a PCN reaction that required hospitalization No Has patient had a PCN reaction occurring within the last 10 years: No If all of the above answers are "NO", then may proceed with Cephalosporin use.     Metabolic Disorder Labs: Lab Results  Component Value Date   HGBA1C 5.6 12/30/2012   MPG 114 12/30/2012   No results found for:  PROLACTIN No results found for: CHOL, TRIG, HDL, CHOLHDL, VLDL, LDLCALC No results found for: TSH  Therapeutic Level Labs: Lab Results  Component Value Date   LITHIUM 1.05 01/21/2012   LITHIUM 0.97 10/24/2011   Lab Results  Component Value Date   VALPROATE 60.5 06/16/2008   No components found for:  CBMZ  Current Medications: Current Outpatient Medications  Medication Sig Dispense Refill  . alprazolam (XANAX) 2 MG tablet TAKE ONE TABLET BY MOUTH 3 TIMES A DAY. 270 tablet 2  . amLODipine (NORVASC) 10 MG tablet Take 5 mg by mouth at bedtime.     Marland Kitchen buPROPion (WELLBUTRIN SR) 150 MG 12 hr tablet Take 2 tablets (300 mg total) by mouth 2 (two) times daily. 360 tablet 3  . busPIRone (BUSPAR) 30 MG tablet TAKE (1) TABLET BY MOUTH (3) TIMES DAILY. 90 tablet 2  . carbamazepine (TEGRETOL XR) 200 MG 12 hr tablet Take 8 tablets at bedtime 240 tablet 2  . clindamycin (CLEOCIN T) 1 % SWAB     . levETIRAcetam (KEPPRA) 500 MG tablet Take 1,000 mg by mouth 2 (two) times daily.     Marland Kitchen lisinopril-hydrochlorothiazide (PRINZIDE,ZESTORETIC) 20-12.5 MG tablet Take 1 tablet by mouth daily.     . Lurasidone HCl 120 MG TABS Take 1 tablet (120 mg total) by mouth daily with supper. 90 tablet 2  . minocycline (MINOCIN,DYNACIN) 100 MG capsule Take 100 mg by mouth daily.     Marland Kitchen morphine (MS CONTIN) 15 MG 12 hr tablet morphine ER 15 mg tablet,extended release  Take 1 tablet every 12 hours by oral route.    Marland Kitchen oxyCODONE-acetaminophen  (PERCOCET) 10-325 MG per tablet Take 1 tablet by mouth every 4 (four) hours as needed for pain.    . phentermine (ADIPEX-P) 37.5 MG tablet Take 37.5 mg by mouth daily.    . rosuvastatin (CRESTOR) 10 MG tablet Take 10 mg by mouth at bedtime.    . tamsulosin (FLOMAX) 0.4 MG CAPS capsule Take 0.4 mg by mouth daily.    Marland Kitchen tiZANidine (ZANAFLEX) 4 MG tablet Take 4 mg by mouth 3 (three) times daily.    Marland Kitchen tretinoin (RETIN-A) 0.05 % cream Apply 1 application topically 2 (two) times daily.    Marland Kitchen zolpidem (AMBIEN CR) 12.5 MG CR tablet Take 1 tablet (12.5 mg total) by mouth at bedtime as needed for sleep. 90 tablet 2   No current facility-administered medications for this visit.     Musculoskeletal: Strength & Muscle Tone: within normal limits Gait & Station: normal Patient leans: N/A  Psychiatric Specialty Exam: Review of Systems  Musculoskeletal: Positive for back pain and neck pain.  Psychiatric/Behavioral: Positive for dysphoric mood.  All other systems reviewed and are negative.   There were no vitals taken for this visit.There is no height or weight on file to calculate BMI.  General Appearance: NA  Eye Contact:  NA  Speech:  Clear and Coherent  Volume:  Normal  Mood:  Dysphoric  Affect:  NA  Thought Process:  Goal Directed  Orientation:  Full (Time, Place, and Person)  Thought Content: Rumination   Suicidal Thoughts:  No  Homicidal Thoughts:  No  Memory:  Immediate;   Good Recent;   Good Remote;   Fair  Judgement:  Good  Insight:  Good  Psychomotor Activity:  Decreased  Concentration:  Concentration: Good and Attention Span: Good  Recall:  Good  Fund of Knowledge: Good  Language: Good  Akathisia:  No  Handed:  Right  AIMS (if indicated): not done  Assets:  Communication Skills Desire for Improvement Resilience Social Support Talents/Skills  ADL's:  Intact  Cognition: WNL  Sleep:  Fair   Screenings:   Assessment and Plan: This patient is a 39 year old male with a  history bipolar disorder depression and obsessional symptoms. He has been more depressed recently so we will increase Wellbutrin SR to 300 mg twice daily for depression, continue Tegretol 200 mg - 8 tablets at bedtime for mood stability, lurasidone 120 mg daily for mood stability, Ambien CR 12.5 mg at bedtime for sleep and Xanax 2 mg 3 times daily for anxiety. He will also continue BuSpar 30 mg 3 times daily for anxiety. He will return to see me in 6 weeks   [name], MD 10/17/2020, 9:15 AM

## 2020-10-17 NOTE — Progress Notes (Signed)
Virtual Visit via Telephone Note  I connected with Edward Lutz on 10/17/20 at  8:40 AM EDT by telephone and verified that I am speaking with the correct person using two identifiers.  Location: Patient: home Provider: office   I discussed the limitations, risks, security and privacy concerns of performing an evaluation and management service by telephone and the availability of in person appointments. I also discussed with the patient that there may be a patient responsible charge related to this service. The patient expressed understanding and agreed to proceed.    I discussed the assessment and treatment plan with the patient. The patient was provided an opportunity to ask questions and all were answered. The patient agreed with the plan and demonstrated an understanding of the instructions.   The patient was advised to call back or seek an in-person evaluation if the symptoms worsen or if the condition fails to improve as anticipated.  I provided 15 minutes of non-face-to-face time during this encounter.   Diannia Rudereborah Wyland Rastetter, MD  [facility] MD/PA/NP OP Progress Note  10/17/2020 11:20 AM Edward Lutz  MRN:  [id number]  Chief Complaint:  Chief Complaint    Depression; Anxiety; Follow-up; Manic Behavior     HPI: This patient is a39 year old married white male who lives with his wife, a daughter 7366years old and a boy 2574years [place].He and his wifehave custody of their4446 year old nephew.he is on disability.  The patient states that he has a history of both substance abuse and bipolar disorder. About 10 years ago he was heavily using crack cocaine and marijuana and alcohol. He developed severe mood issues and was diagnosed as bipolar. He was hospitalized twice in 2009 at [facility] for suicide attempts. He he still has significant problems with short-term memory and temper but he feels he is more stable and he's ever been. He thinks his current combination  of medicines has been very helpful. His mood is stable and he is able to take care of his family while his wife works and he is sleeping well.  The patient returns for follow-up after 3 months. He states that he is under new stress. His sister is 40-year-old son has now been placed with his family. Apparently his sister was found to be on drugs and her boyfriend was arrested. The child was removed from her care and replaced with the patient. He already has custody of the 40 year old nephew. He states that the child is very well behaved and has not had any issues with him but the whole thing is been rather stressful. He states he has been feeling more depressed lately although not suicidal. His sleep is somewhat variable. He still has a lot of pain in his upper back and lower back and pain management is cutting down his morphine because he is on a high dose of Xanax. I suggested that we go up on the Wellbutrin for now and he agrees. Visit Diagnosis:    ICD-10-CM   1. Bipolar 1 disorder (HCC)  F31.9     Past Psychiatric History: Past admissions in 2009 for suicide attempts  Past Medical History:  Past Medical History:  Diagnosis Date  . Back pain   . Bipolar disorder (HCC)   . HTN (hypertension)   . OSA (obstructive sleep apnea)     Past Surgical History:  Procedure Laterality Date  . RHINOPLASTY    . TONSILLECTOMY      Family Psychiatric History: see below  Family History:  Family History  Problem Relation Age of Onset  . Bipolar disorder Mother   . Drug abuse Mother   . Anxiety disorder Mother   . Paranoid behavior Mother   . Physical abuse Mother   . Bipolar disorder Father   . Alcohol abuse Father   . Drug abuse Father   . Anxiety disorder Father   . Paranoid behavior Father   . Physical abuse Father   . Anxiety disorder Sister   . Depression Sister   . ADD / ADHD Daughter   . Dementia Neg Hx   . OCD Neg Hx   . Schizophrenia Neg Hx   . Seizures Neg Hx   . Sexual abuse  Neg Hx     Social History:  Social History   Socioeconomic History  . Marital status: Married    Spouse name: Not on file  . Number of children: Not on file  . Years of education: Not on file  . Highest education level: Not on file  Occupational History  . Not on file  Tobacco Use  . Smoking status: Current Every Day Smoker    Packs/day: 1.00    Years: 15.00    Pack years: 15.00    Types: Cigarettes  . Smokeless tobacco: Never Used  . Tobacco comment: 16-20 cigarettes a day as of 06/08/2013  Substance and Sexual Activity  . Alcohol use: Yes    Alcohol/week: 36.0 standard drinks    Types: 36 Cans of beer per week    Comment: Reports drinks 5-6 beers a day.   . Drug use: No  . Sexual activity: Yes    Partners: Female    Birth control/protection: None  Other Topics Concern  . Not on file  Social History Narrative  . Not on file   Social Determinants of Health   Financial Resource Strain:   . Difficulty of Paying Living Expenses: Not on file  Food Insecurity:   . Worried About Programme researcher, broadcasting/film/video in the Last Year: Not on file  . Ran Out of Food in the Last Year: Not on file  Transportation Needs:   . Lack of Transportation (Medical): Not on file  . Lack of Transportation (Non-Medical): Not on file  Physical Activity:   . Days of Exercise per Week: Not on file  . Minutes of Exercise per Session: Not on file  Stress:   . Feeling of Stress : Not on file  Social Connections:   . Frequency of Communication with Friends and Family: Not on file  . Frequency of Social Gatherings with Friends and Family: Not on file  . Attends Religious Services: Not on file  . Active Member of Clubs or Organizations: Not on file  . Attends Banker Meetings: Not on file  . Marital Status: Not on file    Allergies:  Allergies  Allergen Reactions  . Penicillins Rash    Has patient had a PCN reaction causing immediate rash, facial/tongue/throat swelling, SOB or  lightheadedness with hypotension: Yes Has patient had a PCN reaction causing severe rash involving mucus membranes or skin necrosis: No Has patient had a PCN reaction that required hospitalization No Has patient had a PCN reaction occurring within the last 10 years: No If all of the above answers are "NO", then may proceed with Cephalosporin use.     Metabolic Disorder Labs: Lab Results  Component Value Date   HGBA1C 5.6 12/30/2012   MPG 114 12/30/2012   No results found for:  PROLACTIN No results found for: CHOL, TRIG, HDL, CHOLHDL, VLDL, LDLCALC No results found for: TSH  Therapeutic Level Labs: Lab Results  Component Value Date   LITHIUM 1.05 01/21/2012   LITHIUM 0.97 10/24/2011   Lab Results  Component Value Date   VALPROATE 60.5 06/16/2008   No components found for:  CBMZ  Current Medications: Current Outpatient Medications  Medication Sig Dispense Refill  . alprazolam (XANAX) 2 MG tablet TAKE ONE TABLET BY MOUTH 3 TIMES A DAY. 270 tablet 2  . amLODipine (NORVASC) 10 MG tablet Take 5 mg by mouth at bedtime.     Marland Kitchen buPROPion (WELLBUTRIN SR) 150 MG 12 hr tablet Take 1 tablet (150 mg total) by mouth 3 (three) times daily. 90 tablet 2  . busPIRone (BUSPAR) 30 MG tablet TAKE (1) TABLET BY MOUTH (3) TIMES DAILY. 90 tablet 2  . carbamazepine (TEGRETOL XR) 200 MG 12 hr tablet Take 8 tablets at bedtime 240 tablet 2  . clindamycin (CLEOCIN T) 1 % SWAB     . escitalopram (LEXAPRO) 20 MG tablet Take 1 tablet (20 mg total) by mouth daily. 30 tablet 2  . levETIRAcetam (KEPPRA) 500 MG tablet Take 1,000 mg by mouth 2 (two) times daily.     Marland Kitchen lisinopril-hydrochlorothiazide (PRINZIDE,ZESTORETIC) 20-12.5 MG tablet Take 1 tablet by mouth daily.     . Lurasidone HCl 120 MG TABS Take 1 tablet (120 mg total) by mouth daily with supper. 90 tablet 2  . minocycline (MINOCIN,DYNACIN) 100 MG capsule Take 100 mg by mouth daily.     Marland Kitchen morphine (MS CONTIN) 15 MG 12 hr tablet morphine ER 15 mg  tablet,extended release  Take 1 tablet every 12 hours by oral route.    Marland Kitchen oxyCODONE-acetaminophen (PERCOCET) 10-325 MG per tablet Take 1 tablet by mouth every 4 (four) hours as needed for pain.    . phentermine (ADIPEX-P) 37.5 MG tablet Take 37.5 mg by mouth daily.    . rosuvastatin (CRESTOR) 10 MG tablet Take 10 mg by mouth at bedtime.    . tamsulosin (FLOMAX) 0.4 MG CAPS capsule Take 0.4 mg by mouth daily.    Marland Kitchen tiZANidine (ZANAFLEX) 4 MG tablet Take 4 mg by mouth 3 (three) times daily.    Marland Kitchen tretinoin (RETIN-A) 0.05 % cream Apply 1 application topically 2 (two) times daily.    Marland Kitchen zolpidem (AMBIEN CR) 12.5 MG CR tablet Take 1 tablet (12.5 mg total) by mouth at bedtime as needed for sleep. 90 tablet 2   No current facility-administered medications for this visit.     Musculoskeletal: Strength & Muscle Tone: within normal limits Gait & Station: normal Patient leans: N/A  Psychiatric Specialty Exam: Review of Systems  Musculoskeletal: Positive for back pain and neck pain.  Psychiatric/Behavioral: Positive for dysphoric mood.  All other systems reviewed and are negative.   There were no vitals taken for this visit.There is no height or weight on file to calculate BMI.  General Appearance: NA  Eye Contact:  NA  Speech:  Clear and Coherent  Volume:  Normal  Mood:  Dysphoric  Affect:  NA  Thought Process:  Goal Directed  Orientation:  Full (Time, Place, and Person)  Thought Content: Rumination   Suicidal Thoughts:  No  Homicidal Thoughts:  No  Memory:  Immediate;   Good Recent;   Good Remote;   Fair  Judgement:  Good  Insight:  Good  Psychomotor Activity:  Decreased  Concentration:  Concentration: Good and Attention Span: Good  Recall:  Good  Fund of Knowledge: Good  Language: Good  Akathisia:  No  Handed:  Right  AIMS (if indicated): not done  Assets:  Communication Skills Desire for Improvement Resilience Social Support Talents/Skills  ADL's:  Intact  Cognition: WNL   Sleep:  Fair   Screenings:   Assessment and Plan: This patient is a 39 year old male with a history bipolar disorder depression and obsessional symptoms. He has been more depressed recently so we will add lexapro 20 mg to wellbutrin Sr 150 mg tid for depression, continue Tegretol 200 mg - 8 tablets at bedtime for mood stability, lurasidone 120 mg daily for mood stability, Ambien CR 12.5 mg at bedtime for sleep and Xanax 2 mg 3 times daily for anxiety. He will also continue BuSpar 30 mg 3 times daily for anxiety. He will return to see me in 6 weeks   [name], MD 10/17/2020, 11:20 AM

## 2020-11-28 ENCOUNTER — Other Ambulatory Visit: Payer: Self-pay

## 2020-11-28 ENCOUNTER — Telehealth (INDEPENDENT_AMBULATORY_CARE_PROVIDER_SITE_OTHER): Payer: Medicare Other | Admitting: Psychiatry

## 2020-11-28 ENCOUNTER — Encounter (HOSPITAL_COMMUNITY): Payer: Self-pay | Admitting: Psychiatry

## 2020-11-28 DIAGNOSIS — F319 Bipolar disorder, unspecified: Secondary | ICD-10-CM

## 2020-11-28 MED ORDER — CARIPRAZINE HCL 1.5 MG PO CAPS: 1.5000 mg | ORAL_CAPSULE | Freq: Every day | ORAL | 2 refills | Status: DC

## 2020-11-28 NOTE — Progress Notes (Signed)
Virtual Visit via Video Note  I connected with Edward Lutz on 11/28/20 at  8:40 AM EST by a video enabled telemedicine application and verified that I am speaking with the correct person using two identifiers.  Location: Patient: home Provider: home   I discussed the limitations of evaluation and management by telemedicine and the availability of in person appointments. The patient expressed understanding and agreed to proceed    I discussed the assessment and treatment plan with the patient. The patient was provided an opportunity to ask questions and all were answered. The patient agreed with the plan and demonstrated an understanding of the instructions.   The patient was advised to call back or seek an in-person evaluation if the symptoms worsen or if the condition fails to improve as anticipated.  I provided 15 minutes of non-face-to-face time during this encounter.   Edward Ruder, MD  [facility] MD/PA/NP OP Progress Note  11/28/2020 9:05 AM Edward Lutz  MRN:  [id number]  Chief Complaint:  Chief Complaint    Anxiety; Depression; Follow-up     HPI: This patient is a39 year old married white male who lives with his wife, a daughter 40years old and a boy 40years old in [place].He and his wifehave custody of their40 year old nephew.he is on disability.  The patient states that he has a history of both substance abuse and bipolar disorder. About 10 years ago he was heavily using crack cocaine and marijuana and alcohol. He developed severe mood issues and was diagnosed as bipolar. He was hospitalized twice in 2009 at [facility] for suicide attempts. He he still has significant problems with short-term memory and temper but he feels he is more stable and he's ever been. He thinks his current combination of medicines has been very helpful. His mood is stable and he is able to take care of his family while his wife works and he is sleeping well   The  patient returns for follow-up after 4 weeks.  Last time he was more depressed.  He and his wife had gained temporary custody of their 40-year-old nephew because his sister is not able to take care of him due to substance abuse.  They already have her 40 year old son.  He does feel rather stressed.  He does not like this time of year because he has significant social anxiety around a lot of people at Christmas parties etc.  We did try adding Lexapro 20 mg to his regimen but it is not made any difference that he still feels down and sad.  I suggested that we add a low-dose of Vraylar which is for bipolar disorder particular bipolar depression and he agrees.  He is already on Jordan but we will only add a low dose.  He denies suicidal ideation   Visit Diagnosis:    ICD-10-CM   1. Bipolar 1 disorder (HCC)  F31.9     Past Psychiatric History: Past admissions in 2009 for suicide attempts  Past Medical History:  Past Medical History:  Diagnosis Date  . Back pain   . Bipolar disorder (HCC)   . HTN (hypertension)   . OSA (obstructive sleep apnea)     Past Surgical History:  Procedure Laterality Date  . RHINOPLASTY    . TONSILLECTOMY      Family Psychiatric History: see below  Family History:  Family History  Problem Relation Age of Onset  . Bipolar disorder Mother   . Drug abuse Mother   . Anxiety disorder Mother   .  Paranoid behavior Mother   . Physical abuse Mother   . Bipolar disorder Father   . Alcohol abuse Father   . Drug abuse Father   . Anxiety disorder Father   . Paranoid behavior Father   . Physical abuse Father   . Anxiety disorder Sister   . Depression Sister   . ADD / ADHD Daughter   . Dementia Neg Hx   . OCD Neg Hx   . Schizophrenia Neg Hx   . Seizures Neg Hx   . Sexual abuse Neg Hx     Social History:  Social History   Socioeconomic History  . Marital status: Married    Spouse name: Not on file  . Number of children: Not on file  . Years of education: Not  on file  . Highest education level: Not on file  Occupational History  . Not on file  Tobacco Use  . Smoking status: Current Every Day Smoker    Packs/day: 1.00    Years: 15.00    Pack years: 15.00    Types: Cigarettes  . Smokeless tobacco: Never Used  . Tobacco comment: 16-20 cigarettes a day as of 06/08/2013  Substance and Sexual Activity  . Alcohol use: Yes    Alcohol/week: 36.0 standard drinks    Types: 36 Cans of beer per week    Comment: Reports drinks 5-6 beers a day.   . Drug use: No  . Sexual activity: Yes    Partners: Female    Birth control/protection: None  Other Topics Concern  . Not on file  Social History Narrative  . Not on file   Social Determinants of Health   Financial Resource Strain: Not on file  Food Insecurity: Not on file  Transportation Needs: Not on file  Physical Activity: Not on file  Stress: Not on file  Social Connections: Not on file    Allergies:  Allergies  Allergen Reactions  . Penicillins Rash    Has patient had a PCN reaction causing immediate rash, facial/tongue/throat swelling, SOB or lightheadedness with hypotension: Yes Has patient had a PCN reaction causing severe rash involving mucus membranes or skin necrosis: No Has patient had a PCN reaction that required hospitalization No Has patient had a PCN reaction occurring within the last 10 years: No If all of the above answers are "NO", then may proceed with Cephalosporin use.     Metabolic Disorder Labs: Lab Results  Component Value Date   HGBA1C 5.6 12/30/2012   MPG 114 12/30/2012   No results found for: PROLACTIN No results found for: CHOL, TRIG, HDL, CHOLHDL, VLDL, LDLCALC No results found for: TSH  Therapeutic Level Labs: Lab Results  Component Value Date   LITHIUM 1.05 01/21/2012   LITHIUM 0.97 10/24/2011   Lab Results  Component Value Date   VALPROATE 60.5 06/16/2008   No components found for:  CBMZ  Current Medications: Current Outpatient Medications   Medication Sig Dispense Refill  . alprazolam (XANAX) 2 MG tablet TAKE ONE TABLET BY MOUTH 3 TIMES A DAY. 270 tablet 2  . amLODipine (NORVASC) 10 MG tablet Take 5 mg by mouth at bedtime.     Marland Kitchen. buPROPion (WELLBUTRIN SR) 150 MG 12 hr tablet Take 1 tablet (150 mg total) by mouth 3 (three) times daily. 90 tablet 2  . busPIRone (BUSPAR) 30 MG tablet TAKE (1) TABLET BY MOUTH (3) TIMES DAILY. 90 tablet 2  . carbamazepine (TEGRETOL XR) 200 MG 12 hr tablet Take 8 tablets at bedtime  240 tablet 2  . cariprazine (VRAYLAR) capsule Take 1 capsule (1.5 mg total) by mouth daily. 30 capsule 2  . clindamycin (CLEOCIN T) 1 % SWAB     . levETIRAcetam (KEPPRA) 500 MG tablet Take 1,000 mg by mouth 2 (two) times daily.     Marland Kitchen lisinopril-hydrochlorothiazide (PRINZIDE,ZESTORETIC) 20-12.5 MG tablet Take 1 tablet by mouth daily.     . Lurasidone HCl 120 MG TABS Take 1 tablet (120 mg total) by mouth daily with supper. 90 tablet 2  . minocycline (MINOCIN,DYNACIN) 100 MG capsule Take 100 mg by mouth daily.     Marland Kitchen morphine (MS CONTIN) 15 MG 12 hr tablet morphine ER 15 mg tablet,extended release  Take 1 tablet every 12 hours by oral route.    Marland Kitchen oxyCODONE-acetaminophen (PERCOCET) 10-325 MG per tablet Take 1 tablet by mouth every 4 (four) hours as needed for pain.    . phentermine (ADIPEX-P) 37.5 MG tablet Take 37.5 mg by mouth daily.    . rosuvastatin (CRESTOR) 10 MG tablet Take 10 mg by mouth at bedtime.    . tamsulosin (FLOMAX) 0.4 MG CAPS capsule Take 0.4 mg by mouth daily.    Marland Kitchen tiZANidine (ZANAFLEX) 4 MG tablet Take 4 mg by mouth 3 (three) times daily.    Marland Kitchen tretinoin (RETIN-A) 0.05 % cream Apply 1 application topically 2 (two) times daily.    Marland Kitchen zolpidem (AMBIEN CR) 12.5 MG CR tablet Take 1 tablet (12.5 mg total) by mouth at bedtime as needed for sleep. 90 tablet 2   No current facility-administered medications for this visit.     Musculoskeletal: Strength & Muscle Tone: within normal limits Gait & Station:  normal Patient leans: N/A  Psychiatric Specialty Exam: Review of Systems  Musculoskeletal: Positive for back pain.  Psychiatric/Behavioral: Positive for dysphoric mood. The patient is nervous/anxious.   All other systems reviewed and are negative.   There were no vitals taken for this visit.There is no height or weight on file to calculate BMI.  General Appearance: Casual, Neat and Well Groomed  Eye Contact:  Good  Speech:  Clear and Coherent  Volume:  Normal  Mood:  dysphoric  Affect:  Flat  Thought Process:  Goal Directed  Orientation:  Full (Time, Place, and Person)  Thought Content: Rumination   Suicidal Thoughts:  No  Homicidal Thoughts:  No  Memory:  Immediate;   Good Recent;   Good Remote;   Fair  Judgement:  Good  Insight:  Fair  Psychomotor Activity:  Decreased  Concentration:  Concentration: Good and Attention Span: Good  Recall:  Good  Fund of Knowledge: Good  Language: Good  Akathisia:  No  Handed:  Right  AIMS (if indicated): not done  Assets:  Communication Skills Desire for Improvement Resilience Social Support Talents/Skills  ADL's:  Intact  Cognition: WNL  Sleep:  Fair   Screenings:   Assessment and Plan: This patient is a 39 year old male with a history of bipolar disorder depression obsessional symptoms.  He has been more depressed recently and we tried adding Lexapro but it did not help.  He has tried Abilify before which was not effective.  We will add a low-dose of Vraylar-1.5 mg to his regimen.  He will continue Wellbutrin SR 450 mg daily for depression, Tegretol 200 mg - 8 tablets at bedtime for mood stability, lurasidone 120 mg daily for mood stability, Abilify CR 12.5 mg at bedtime for sleep and Xanax 2 mg daily for anxiety as well as BuSpar 30  mg 3 times daily for anxiety.  Return to see me in 4 weeks   Edward Ruder, MD 11/28/2020, 9:05 AM

## 2020-12-28 ENCOUNTER — Telehealth (HOSPITAL_COMMUNITY): Payer: Medicare Other | Admitting: Psychiatry

## 2020-12-29 ENCOUNTER — Encounter (HOSPITAL_COMMUNITY): Payer: Self-pay | Admitting: Psychiatry

## 2020-12-29 ENCOUNTER — Telehealth (INDEPENDENT_AMBULATORY_CARE_PROVIDER_SITE_OTHER): Payer: Medicare Other | Admitting: Psychiatry

## 2020-12-29 ENCOUNTER — Other Ambulatory Visit: Payer: Self-pay

## 2020-12-29 DIAGNOSIS — F319 Bipolar disorder, unspecified: Secondary | ICD-10-CM

## 2020-12-29 MED ORDER — ZOLPIDEM TARTRATE ER 12.5 MG PO TBCR: 12.5000 mg | EXTENDED_RELEASE_TABLET | Freq: Every evening | ORAL | 2 refills | Status: DC | PRN

## 2020-12-29 MED ORDER — BUSPIRONE HCL 30 MG PO TABS
ORAL_TABLET | ORAL | 2 refills | Status: DC
Start: 2020-12-29 — End: 2021-03-12

## 2020-12-29 MED ORDER — BUPROPION HCL ER (SR) 150 MG PO TB12: 150.0000 mg | ORAL_TABLET | Freq: Three times a day (TID) | ORAL | 2 refills | Status: DC

## 2020-12-29 MED ORDER — LURASIDONE HCL 120 MG PO TABS
120.0000 mg | ORAL_TABLET | Freq: Every day | ORAL | 2 refills | Status: DC
Start: 2020-12-29 — End: 2021-03-29

## 2020-12-29 MED ORDER — ALPRAZOLAM 2 MG PO TABS
ORAL_TABLET | ORAL | 2 refills | Status: DC
Start: 2020-12-29 — End: 2021-03-29

## 2020-12-29 NOTE — Progress Notes (Signed)
Virtual Visit via Video Note  I connected with Edward Lutz on 12/29/20 at  8:40 AM EST by a video enabled telemedicine application and verified that I am speaking with the correct person using two identifiers.  Location: Patient: home Provider: home   I discussed the limitations of evaluation and management by telemedicine and the availability of in person appointments. The patient expressed understanding and agreed to proceed    I discussed the assessment and treatment plan with the patient. The patient was provided an opportunity to ask questions and all were answered. The patient agreed with the plan and demonstrated an understanding of the instructions.   The patient was advised to call back or seek an in-person evaluation if the symptoms worsen or if the condition fails to improve as anticipated.  I provided 15 minutes of non-face-to-face time during this encounter.   [name], MD  [facility] MD/PA/NP OP Progress Note  12/29/2020 8:59 AM Virgina Lutz  MRN:  [id number]  Chief Complaint:  Chief Complaint    Anxiety; Depression; Manic Behavior; Follow-up     HPI: This patient is a40 year old married white male who lives with his wife, a daughter28years old and a boy 72years old in [place].He and his wifehave custody of their41 year old nephew.he is on disability.  The patient states that he has a history of both substance abuse and bipolar disorder. About 10 years ago he was heavily using crack cocaine and marijuana and alcohol. He developed severe mood issues and was diagnosed as bipolar. He was hospitalized twice in 2009 at [facility] for suicide attempts. He he still has significant problems with short-term memory and temper but he feels he is more stable and he's ever been. He thinks his current combination of medicines has been very helpful. His mood is stable and he is able to take care of his family while his wife works and he is sleeping  well   The patient returns for follow-up after 4 weeks.  Last time he seemed a lot more stressed as he was going through possible gaining custody of his 21-year-old nephew.  I added Vraylar to his regimen but it "tore my stomach up" so he stopped it.  He stated that he and his wife had a court date yesterday and they have been granted custody of the nephew and this is put at least some closure on the case for now.  They also have custody of the 25 year old nephew.  They are dealing with that nephews biological father who is trying to start visitation and possible custody.  He states he realizes that he has to take this 1 day at a time.  He is getting good support from his wife and older children.  His mood seems to be stable right now and he denies thoughts of suicide or self-harm. Visit Diagnosis:    ICD-10-CM   1. Bipolar 1 disorder (HCC)  F31.9     Past Psychiatric History: Past admissions in 2009 for suicide attempts  Past Medical History:  Past Medical History:  Diagnosis Date  . Back pain   . Bipolar disorder (HCC)   . HTN (hypertension)   . OSA (obstructive sleep apnea)     Past Surgical History:  Procedure Laterality Date  . RHINOPLASTY    . TONSILLECTOMY      Family Psychiatric History: See below  Family History:  Family History  Problem Relation Age of Onset  . Bipolar disorder Mother   . Drug abuse  Mother   . Anxiety disorder Mother   . Paranoid behavior Mother   . Physical abuse Mother   . Bipolar disorder Father   . Alcohol abuse Father   . Drug abuse Father   . Anxiety disorder Father   . Paranoid behavior Father   . Physical abuse Father   . Anxiety disorder Sister   . Depression Sister   . ADD / ADHD Daughter   . Dementia Neg Hx   . OCD Neg Hx   . Schizophrenia Neg Hx   . Seizures Neg Hx   . Sexual abuse Neg Hx     Social History:  Social History   Socioeconomic History  . Marital status: Married    Spouse name: Not on file  . Number of children:  Not on file  . Years of education: Not on file  . Highest education level: Not on file  Occupational History  . Not on file  Tobacco Use  . Smoking status: Current Every Day Smoker    Packs/day: 1.00    Years: 15.00    Pack years: 15.00    Types: Cigarettes  . Smokeless tobacco: Never Used  . Tobacco comment: 16-20 cigarettes a day as of 06/08/2013  Substance and Sexual Activity  . Alcohol use: Yes    Alcohol/week: 36.0 standard drinks    Types: 36 Cans of beer per week    Comment: Reports drinks 5-6 beers a day.   . Drug use: No  . Sexual activity: Yes    Partners: Female    Birth control/protection: None  Other Topics Concern  . Not on file  Social History Narrative  . Not on file   Social Determinants of Health   Financial Resource Strain: Not on file  Food Insecurity: Not on file  Transportation Needs: Not on file  Physical Activity: Not on file  Stress: Not on file  Social Connections: Not on file    Allergies:  Allergies  Allergen Reactions  . Penicillins Rash    Has patient had a PCN reaction causing immediate rash, facial/tongue/throat swelling, SOB or lightheadedness with hypotension: Yes Has patient had a PCN reaction causing severe rash involving mucus membranes or skin necrosis: No Has patient had a PCN reaction that required hospitalization No Has patient had a PCN reaction occurring within the last 10 years: No If all of the above answers are "NO", then may proceed with Cephalosporin use.     Metabolic Disorder Labs: Lab Results  Component Value Date   HGBA1C 5.6 12/30/2012   MPG 114 12/30/2012   No results found for: PROLACTIN No results found for: CHOL, TRIG, HDL, CHOLHDL, VLDL, LDLCALC No results found for: TSH  Therapeutic Level Labs: Lab Results  Component Value Date   LITHIUM 1.05 01/21/2012   LITHIUM 0.97 10/24/2011   Lab Results  Component Value Date   VALPROATE 60.5 06/16/2008   No components found for:  CBMZ  Current  Medications: Current Outpatient Medications  Medication Sig Dispense Refill  . alprazolam (XANAX) 2 MG tablet TAKE ONE TABLET BY MOUTH 3 TIMES A DAY. 270 tablet 2  . amLODipine (NORVASC) 10 MG tablet Take 5 mg by mouth at bedtime.     Marland Kitchen buPROPion (WELLBUTRIN SR) 150 MG 12 hr tablet Take 1 tablet (150 mg total) by mouth 3 (three) times daily. 90 tablet 2  . busPIRone (BUSPAR) 30 MG tablet TAKE (1) TABLET BY MOUTH (3) TIMES DAILY. 90 tablet 2  . carbamazepine (TEGRETOL XR)  200 MG 12 hr tablet Take 8 tablets at bedtime 240 tablet 2  . clindamycin (CLEOCIN T) 1 % SWAB     . levETIRAcetam (KEPPRA) 500 MG tablet Take 1,000 mg by mouth 2 (two) times daily.     Marland Kitchen lisinopril-hydrochlorothiazide (PRINZIDE,ZESTORETIC) 20-12.5 MG tablet Take 1 tablet by mouth daily.     . Lurasidone HCl 120 MG TABS Take 1 tablet (120 mg total) by mouth daily with supper. 90 tablet 2  . minocycline (MINOCIN,DYNACIN) 100 MG capsule Take 100 mg by mouth daily.     Marland Kitchen morphine (MS CONTIN) 15 MG 12 hr tablet morphine ER 15 mg tablet,extended release  Take 1 tablet every 12 hours by oral route.    Marland Kitchen oxyCODONE-acetaminophen (PERCOCET) 10-325 MG per tablet Take 1 tablet by mouth every 4 (four) hours as needed for pain.    . phentermine (ADIPEX-P) 37.5 MG tablet Take 37.5 mg by mouth daily.    . rosuvastatin (CRESTOR) 10 MG tablet Take 10 mg by mouth at bedtime.    . tamsulosin (FLOMAX) 0.4 MG CAPS capsule Take 0.4 mg by mouth daily.    Marland Kitchen tiZANidine (ZANAFLEX) 4 MG tablet Take 4 mg by mouth 3 (three) times daily.    Marland Kitchen tretinoin (RETIN-A) 0.05 % cream Apply 1 application topically 2 (two) times daily.    Marland Kitchen zolpidem (AMBIEN CR) 12.5 MG CR tablet Take 1 tablet (12.5 mg total) by mouth at bedtime as needed for sleep. 90 tablet 2   No current facility-administered medications for this visit.     Musculoskeletal: Strength & Muscle Tone: within normal limits Gait & Station: normal Patient leans: N/A  Psychiatric Specialty  Exam: Review of Systems  Psychiatric/Behavioral: The patient is nervous/anxious.   All other systems reviewed and are negative.   There were no vitals taken for this visit.There is no height or weight on file to calculate BMI.  General Appearance: Casual and Fairly Groomed  Eye Contact:  Good  Speech:  Clear and Coherent  Volume:  Normal  Mood:  Anxious and Euthymic  Affect:  Appropriate and Congruent  Thought Process:  Goal Directed  Orientation:  Full (Time, Place, and Person)  Thought Content: Rumination   Suicidal Thoughts:  No  Homicidal Thoughts:  No  Memory:  Immediate;   Good Recent;   Good Remote;   Fair  Judgement:  Good  Insight:  Good  Psychomotor Activity:  Normal  Concentration:  Concentration: Good and Attention Span: Good  Recall:  Good  Fund of Knowledge: Good  Language: Good  Akathisia:  No  Handed:  Right  AIMS (if indicated): not done  Assets:  Communication Skills Desire for Improvement Physical Health Resilience Social Support Talents/Skills  ADL's:  Intact  Cognition: WNL  Sleep:  Fair   Screenings:   Assessment and Plan: This patient is a 40 year old male with a history of bipolar disorder, depression and obsessional symptoms.  He seems to be doing better now that some things have been resolved with the child custody situation.  He will continue Wellbutrin SR 450 mg daily for depression, Tegretol 200 mg - 8 tablets at bedtime for mood stability, lurasidone 120 mg daily for mood stability, Ambien CR 12.5 mg at bedtime for sleep and Xanax 2 mg 3 times daily for anxiety as well as BuSpar 30 mg 3 times daily for anxiety.  He will return to see me in 3 months   [name], MD 12/29/2020, 8:59 AM

## 2021-01-13 DIAGNOSIS — G894 Chronic pain syndrome: Secondary | ICD-10-CM | POA: Diagnosis not present

## 2021-01-13 DIAGNOSIS — L709 Acne, unspecified: Secondary | ICD-10-CM | POA: Diagnosis not present

## 2021-01-13 DIAGNOSIS — F17210 Nicotine dependence, cigarettes, uncomplicated: Secondary | ICD-10-CM | POA: Diagnosis not present

## 2021-01-13 DIAGNOSIS — I10 Essential (primary) hypertension: Secondary | ICD-10-CM | POA: Diagnosis not present

## 2021-01-13 DIAGNOSIS — E782 Mixed hyperlipidemia: Secondary | ICD-10-CM | POA: Diagnosis not present

## 2021-01-13 DIAGNOSIS — F5101 Primary insomnia: Secondary | ICD-10-CM | POA: Diagnosis not present

## 2021-01-13 DIAGNOSIS — Z720 Tobacco use: Secondary | ICD-10-CM | POA: Diagnosis not present

## 2021-01-15 DIAGNOSIS — R7301 Impaired fasting glucose: Secondary | ICD-10-CM | POA: Diagnosis not present

## 2021-01-18 ENCOUNTER — Other Ambulatory Visit: Payer: Self-pay | Admitting: Neurology

## 2021-01-18 ENCOUNTER — Other Ambulatory Visit (HOSPITAL_COMMUNITY): Payer: Self-pay | Admitting: Neurology

## 2021-01-18 DIAGNOSIS — G5602 Carpal tunnel syndrome, left upper limb: Secondary | ICD-10-CM | POA: Diagnosis not present

## 2021-01-18 DIAGNOSIS — Z79891 Long term (current) use of opiate analgesic: Secondary | ICD-10-CM | POA: Diagnosis not present

## 2021-01-18 DIAGNOSIS — M545 Low back pain, unspecified: Secondary | ICD-10-CM

## 2021-01-18 DIAGNOSIS — M5412 Radiculopathy, cervical region: Secondary | ICD-10-CM | POA: Diagnosis not present

## 2021-01-18 DIAGNOSIS — I10 Essential (primary) hypertension: Secondary | ICD-10-CM | POA: Diagnosis not present

## 2021-01-18 DIAGNOSIS — M79602 Pain in left arm: Secondary | ICD-10-CM | POA: Diagnosis not present

## 2021-01-22 ENCOUNTER — Other Ambulatory Visit (HOSPITAL_COMMUNITY): Payer: Self-pay | Admitting: Psychiatry

## 2021-02-07 ENCOUNTER — Ambulatory Visit (HOSPITAL_COMMUNITY)
Admission: RE | Admit: 2021-02-07 | Discharge: 2021-02-07 | Disposition: A | Discharge status: Home / Self Care | Payer: Medicare Other | Source: Ambulatory Visit | Attending: Neurology | Admitting: Neurology

## 2021-02-07 ENCOUNTER — Other Ambulatory Visit: Payer: Self-pay

## 2021-02-07 DIAGNOSIS — M545 Low back pain, unspecified: Secondary | ICD-10-CM

## 2021-03-09 ENCOUNTER — Other Ambulatory Visit (HOSPITAL_COMMUNITY): Payer: Self-pay | Admitting: Psychiatry

## 2021-03-12 ENCOUNTER — Other Ambulatory Visit (HOSPITAL_COMMUNITY): Payer: Self-pay | Admitting: Psychiatry

## 2021-03-14 DIAGNOSIS — E782 Mixed hyperlipidemia: Secondary | ICD-10-CM | POA: Diagnosis not present

## 2021-03-14 DIAGNOSIS — I10 Essential (primary) hypertension: Secondary | ICD-10-CM | POA: Diagnosis not present

## 2021-03-14 DIAGNOSIS — F17200 Nicotine dependence, unspecified, uncomplicated: Secondary | ICD-10-CM | POA: Diagnosis not present

## 2021-03-29 ENCOUNTER — Telehealth (HOSPITAL_COMMUNITY): Payer: Medicare Other | Admitting: Psychiatry

## 2021-03-29 ENCOUNTER — Encounter (HOSPITAL_COMMUNITY): Payer: Self-pay

## 2021-03-29 ENCOUNTER — Encounter (HOSPITAL_COMMUNITY): Payer: Self-pay | Admitting: Psychiatry

## 2021-03-29 ENCOUNTER — Other Ambulatory Visit: Payer: Self-pay

## 2021-03-29 ENCOUNTER — Telehealth (INDEPENDENT_AMBULATORY_CARE_PROVIDER_SITE_OTHER): Payer: Medicare Other | Admitting: Psychiatry

## 2021-03-29 DIAGNOSIS — F319 Bipolar disorder, unspecified: Secondary | ICD-10-CM | POA: Diagnosis not present

## 2021-03-29 MED ORDER — ZOLPIDEM TARTRATE ER 12.5 MG PO TBCR: 12.5000 mg | EXTENDED_RELEASE_TABLET | Freq: Every evening | ORAL | 2 refills | Status: DC | PRN

## 2021-03-29 MED ORDER — LURASIDONE HCL 120 MG PO TABS: 120.0000 mg | ORAL_TABLET | Freq: Every day | ORAL | 2 refills | Status: DC

## 2021-03-29 MED ORDER — ALPRAZOLAM 2 MG PO TABS: ORAL_TABLET | ORAL | 2 refills | Status: DC

## 2021-03-29 MED ORDER — CARBAMAZEPINE ER 200 MG PO TB12: ORAL_TABLET | ORAL | 2 refills | Status: DC

## 2021-03-29 MED ORDER — BUSPIRONE HCL 30 MG PO TABS: ORAL_TABLET | ORAL | 2 refills | Status: DC

## 2021-03-29 MED ORDER — BUPROPION HCL ER (SR) 150 MG PO TB12: 150.0000 mg | ORAL_TABLET | Freq: Three times a day (TID) | ORAL | 0 refills | Status: DC

## 2021-03-29 NOTE — Progress Notes (Signed)
Virtual Visit via Video Note  I connected with Edward Lutz on 03/29/21 at  1:40 PM EDT by a video enabled telemedicine application and verified that I am speaking with the correct person using two identifiers.  Location: Patient: home Provider: home   I discussed the limitations of evaluation and management by telemedicine and the availability of in person appointments. The patient expressed understanding and agreed to proceed.   I discussed the assessment and treatment plan with the patient. The patient was provided an opportunity to ask questions and all were answered. The patient agreed with the plan and demonstrated an understanding of the instructions.   The patient was advised to call back or seek an in-person evaluation if the symptoms worsen or if the condition fails to improve as anticipated.  I provided 15 minutes of non-face-to-face time during this encounter.   [name], MD  [facility] MD/PA/NP OP Progress Note  03/29/2021 2:12 PM Edward Lutz  MRN:  [id number]  Chief Complaint:  Chief Complaint    Anxiety; Depression; Manic Behavior; Follow-up     HPI:  HPI: This patient is a40 year old married white male who lives with his wife, a daughter66years old and a boy 89years old in [place].He and his wifehave custody of their55 year old nephew.he is on disability.  The patient states that he has a history of both substance abuse and bipolar disorder. About 10 years ago he was heavily using crack cocaine and marijuana and alcohol. He developed severe mood issues and was diagnosed as bipolar. He was hospitalized twice in 2009 at [facility] for suicide attempts. He he still has significant problems with short-term memory and temper but he feels he is more stable and he's ever been. He thinks his current combination of medicines has been very helpful. His mood is stable and he is able to take care of his family while his wife works and he is  sleeping well  The patient returns for follow-up after 3 months.  He recently got custody of his 28-year-old nephew.  He and his wife also have custody of his brother who is 51 years old.  They both have significant issues relating to their mother substance abuse etc.  He is trying to do everything he can to get them the correct help.  He states that this has been somewhat stressful but necessary.  He states that his mood has been stable and he denies significant depression or anxiety.  He is sleeping pretty well.  He denies any manic symptoms agitation or significant irritability. Visit Diagnosis:    ICD-10-CM   1. Bipolar 1 disorder (HCC)  F31.9     Past Psychiatric History: Past admissions in 2009 for suicide attempts  Past Medical History:  Past Medical History:  Diagnosis Date  . Back pain   . Bipolar disorder (HCC)   . HTN (hypertension)   . OSA (obstructive sleep apnea)     Past Surgical History:  Procedure Laterality Date  . RHINOPLASTY    . TONSILLECTOMY      Family Psychiatric History: see below  Family History:  Family History  Problem Relation Age of Onset  . Bipolar disorder Mother   . Drug abuse Mother   . Anxiety disorder Mother   . Paranoid behavior Mother   . Physical abuse Mother   . Bipolar disorder Father   . Alcohol abuse Father   . Drug abuse Father   . Anxiety disorder Father   . Paranoid behavior Father   .  Physical abuse Father   . Anxiety disorder Sister   . Depression Sister   . ADD / ADHD Daughter   . Dementia Neg Hx   . OCD Neg Hx   . Schizophrenia Neg Hx   . Seizures Neg Hx   . Sexual abuse Neg Hx     Social History:  Social History   Socioeconomic History  . Marital status: Married    Spouse name: Not on file  . Number of children: Not on file  . Years of education: Not on file  . Highest education level: Not on file  Occupational History  . Not on file  Tobacco Use  . Smoking status: Current Every Day Smoker    Packs/day:  1.00    Years: 15.00    Pack years: 15.00    Types: Cigarettes  . Smokeless tobacco: Never Used  . Tobacco comment: 16-20 cigarettes a day as of 06/08/2013  Substance and Sexual Activity  . Alcohol use: Yes    Alcohol/week: 36.0 standard drinks    Types: 36 Cans of beer per week    Comment: Reports drinks 5-6 beers a day.   . Drug use: No  . Sexual activity: Yes    Partners: Female    Birth control/protection: None  Other Topics Concern  . Not on file  Social History Narrative  . Not on file   Social Determinants of Health   Financial Resource Strain: Not on file  Food Insecurity: Not on file  Transportation Needs: Not on file  Physical Activity: Not on file  Stress: Not on file  Social Connections: Not on file    Allergies:  Allergies  Allergen Reactions  . Penicillins Rash    Has patient had a PCN reaction causing immediate rash, facial/tongue/throat swelling, SOB or lightheadedness with hypotension: Yes Has patient had a PCN reaction causing severe rash involving mucus membranes or skin necrosis: No Has patient had a PCN reaction that required hospitalization No Has patient had a PCN reaction occurring within the last 10 years: No If all of the above answers are "NO", then may proceed with Cephalosporin use.     Metabolic Disorder Labs: Lab Results  Component Value Date   HGBA1C 5.6 12/30/2012   MPG 114 12/30/2012   No results found for: PROLACTIN No results found for: CHOL, TRIG, HDL, CHOLHDL, VLDL, LDLCALC No results found for: TSH  Therapeutic Level Labs: Lab Results  Component Value Date   LITHIUM 1.05 01/21/2012   LITHIUM 0.97 10/24/2011   Lab Results  Component Value Date   VALPROATE 60.5 06/16/2008   No components found for:  CBMZ  Current Medications: Current Outpatient Medications  Medication Sig Dispense Refill  . alprazolam (XANAX) 2 MG tablet TAKE ONE TABLET BY MOUTH 3 TIMES A DAY. 270 tablet 2  . amLODipine (NORVASC) 10 MG tablet Take  5 mg by mouth at bedtime.     Marland Kitchen buPROPion (WELLBUTRIN SR) 150 MG 12 hr tablet Take 1 tablet (150 mg total) by mouth 3 (three) times daily. 90 tablet 0  . busPIRone (BUSPAR) 30 MG tablet TAKE (1) TABLET BY MOUTH (3) TIMES DAILY. 90 tablet 2  . carbamazepine (TEGRETOL XR) 200 MG 12 hr tablet TAKE 8 TABLETS BY MOUTH AT BEDTIME. 240 tablet 2  . clindamycin (CLEOCIN T) 1 % SWAB     . levETIRAcetam (KEPPRA) 500 MG tablet Take 1,000 mg by mouth 2 (two) times daily.     Marland Kitchen lisinopril-hydrochlorothiazide (PRINZIDE,ZESTORETIC) 20-12.5 MG tablet  Take 1 tablet by mouth daily.     . Lurasidone HCl 120 MG TABS Take 1 tablet (120 mg total) by mouth daily with supper. 90 tablet 2  . minocycline (MINOCIN,DYNACIN) 100 MG capsule Take 100 mg by mouth daily.     Marland Kitchen morphine (MS CONTIN) 15 MG 12 hr tablet morphine ER 15 mg tablet,extended release  Take 1 tablet every 12 hours by oral route.    Marland Kitchen oxyCODONE-acetaminophen (PERCOCET) 10-325 MG per tablet Take 1 tablet by mouth every 4 (four) hours as needed for pain.    . phentermine (ADIPEX-P) 37.5 MG tablet Take 37.5 mg by mouth daily.    . rosuvastatin (CRESTOR) 10 MG tablet Take 10 mg by mouth at bedtime.    . tamsulosin (FLOMAX) 0.4 MG CAPS capsule Take 0.4 mg by mouth daily.    Marland Kitchen tiZANidine (ZANAFLEX) 4 MG tablet Take 4 mg by mouth 3 (three) times daily.    Marland Kitchen tretinoin (RETIN-A) 0.05 % cream Apply 1 application topically 2 (two) times daily.    Marland Kitchen zolpidem (AMBIEN CR) 12.5 MG CR tablet Take 1 tablet (12.5 mg total) by mouth at bedtime as needed for sleep. 90 tablet 2   No current facility-administered medications for this visit.     Musculoskeletal: Strength & Muscle Tone: within normal limits Gait & Station: normal Patient leans: N/A  Psychiatric Specialty Exam: Review of Systems  All other systems reviewed and are negative.   There were no vitals taken for this visit.There is no height or weight on file to calculate BMI.  General Appearance: Casual and  Fairly Groomed  Eye Contact:  Good  Speech:  Clear and Coherent  Volume:  Normal  Mood:  Euthymic  Affect:  Appropriate and Congruent  Thought Process:  Goal Directed  Orientation:  Full (Time, Place, and Person)  Thought Content: Rumination   Suicidal Thoughts:  No  Homicidal Thoughts:  No  Memory:  Immediate;   Good Recent;   Good Remote;   Good  Judgement:  Good  Insight:  Fair  Psychomotor Activity:  Normal  Concentration:  Concentration: Good and Attention Span: Good  Recall:  Good  Fund of Knowledge: Good  Language: Good  Akathisia:  No  Handed:  Right  AIMS (if indicated): not done  Assets:  Communication Skills Desire for Improvement Physical Health Resilience Social Support Talents/Skills  ADL's:  Intact  Cognition: WNL  Sleep:  Good   Screenings: PHQ2-9   Flowsheet Row [facility]29[facility]2021 [facility]  PHQ-2 Total Score 0    Flowsheet Row Video Visit from 03/29/2021 [facility]  C-SSRS RISK CATEGORY No Risk       Assessment and Plan: This patient is a 40 year old male with a history of bipolar disorder depression obsessional symptoms.  He seems to be doing fairly well.  He will continue Wellbutrin SR 450 mg daily for depression, Tegretol 200 mg-8 tablets at bedtime for mood stability, lurasidone 120 mg daily for mood stability, Ambien CR 12.5 mg at bedtime for sleep Xanax 2 mg 3 times daily for anxiety and BuSpar 30 mg 3 times daily for anxiety.  He will return to see me in 3 months   [name], MD 03/29/2021, 2:12 PM

## 2021-04-12 DIAGNOSIS — M5416 Radiculopathy, lumbar region: Secondary | ICD-10-CM | POA: Diagnosis not present

## 2021-04-12 DIAGNOSIS — I10 Essential (primary) hypertension: Secondary | ICD-10-CM | POA: Diagnosis not present

## 2021-04-12 DIAGNOSIS — M79602 Pain in left arm: Secondary | ICD-10-CM | POA: Diagnosis not present

## 2021-04-12 DIAGNOSIS — M47896 Other spondylosis, lumbar region: Secondary | ICD-10-CM | POA: Diagnosis not present

## 2021-04-12 DIAGNOSIS — M5412 Radiculopathy, cervical region: Secondary | ICD-10-CM | POA: Diagnosis not present

## 2021-04-12 DIAGNOSIS — M545 Low back pain, unspecified: Secondary | ICD-10-CM | POA: Diagnosis not present

## 2021-04-12 DIAGNOSIS — Z79891 Long term (current) use of opiate analgesic: Secondary | ICD-10-CM | POA: Diagnosis not present

## 2021-04-12 DIAGNOSIS — G5602 Carpal tunnel syndrome, left upper limb: Secondary | ICD-10-CM | POA: Diagnosis not present

## 2021-05-08 DIAGNOSIS — M792 Neuralgia and neuritis, unspecified: Secondary | ICD-10-CM | POA: Diagnosis not present

## 2021-05-08 DIAGNOSIS — M47816 Spondylosis without myelopathy or radiculopathy, lumbar region: Secondary | ICD-10-CM | POA: Diagnosis not present

## 2021-05-09 ENCOUNTER — Other Ambulatory Visit (HOSPITAL_COMMUNITY): Payer: Self-pay | Admitting: Psychiatry

## 2021-05-21 DIAGNOSIS — M47816 Spondylosis without myelopathy or radiculopathy, lumbar region: Secondary | ICD-10-CM | POA: Diagnosis not present

## 2021-06-09 ENCOUNTER — Other Ambulatory Visit (HOSPITAL_COMMUNITY): Payer: Self-pay | Admitting: Psychiatry

## 2021-07-02 ENCOUNTER — Telehealth (HOSPITAL_COMMUNITY): Payer: Medicare Other | Admitting: Psychiatry

## 2021-07-02 DIAGNOSIS — M47816 Spondylosis without myelopathy or radiculopathy, lumbar region: Secondary | ICD-10-CM | POA: Diagnosis not present

## 2021-07-12 DIAGNOSIS — M545 Low back pain, unspecified: Secondary | ICD-10-CM | POA: Diagnosis not present

## 2021-07-12 DIAGNOSIS — M5412 Radiculopathy, cervical region: Secondary | ICD-10-CM | POA: Diagnosis not present

## 2021-07-12 DIAGNOSIS — M79602 Pain in left arm: Secondary | ICD-10-CM | POA: Diagnosis not present

## 2021-07-12 DIAGNOSIS — G5602 Carpal tunnel syndrome, left upper limb: Secondary | ICD-10-CM | POA: Diagnosis not present

## 2021-07-12 DIAGNOSIS — M47896 Other spondylosis, lumbar region: Secondary | ICD-10-CM | POA: Diagnosis not present

## 2021-07-12 DIAGNOSIS — Z79891 Long term (current) use of opiate analgesic: Secondary | ICD-10-CM | POA: Diagnosis not present

## 2021-07-12 DIAGNOSIS — M5416 Radiculopathy, lumbar region: Secondary | ICD-10-CM | POA: Diagnosis not present

## 2021-07-12 DIAGNOSIS — I10 Essential (primary) hypertension: Secondary | ICD-10-CM | POA: Diagnosis not present

## 2021-07-18 ENCOUNTER — Other Ambulatory Visit (HOSPITAL_COMMUNITY): Payer: Self-pay | Admitting: Psychiatry

## 2021-07-26 DIAGNOSIS — G8929 Other chronic pain: Secondary | ICD-10-CM | POA: Diagnosis not present

## 2021-07-26 DIAGNOSIS — M546 Pain in thoracic spine: Secondary | ICD-10-CM | POA: Diagnosis not present

## 2021-07-26 DIAGNOSIS — M47816 Spondylosis without myelopathy or radiculopathy, lumbar region: Secondary | ICD-10-CM | POA: Diagnosis not present

## 2021-07-30 ENCOUNTER — Telehealth (HOSPITAL_COMMUNITY): Payer: Medicare Other | Admitting: Psychiatry

## 2021-08-08 ENCOUNTER — Telehealth (INDEPENDENT_AMBULATORY_CARE_PROVIDER_SITE_OTHER): Payer: Medicare Other | Admitting: Psychiatry

## 2021-08-08 ENCOUNTER — Encounter (HOSPITAL_COMMUNITY): Payer: Self-pay | Admitting: Psychiatry

## 2021-08-08 ENCOUNTER — Other Ambulatory Visit: Payer: Self-pay

## 2021-08-08 DIAGNOSIS — F319 Bipolar disorder, unspecified: Secondary | ICD-10-CM

## 2021-08-08 MED ORDER — BUSPIRONE HCL 30 MG PO TABS: ORAL_TABLET | ORAL | 0 refills | Status: DC

## 2021-08-08 MED ORDER — BUPROPION HCL ER (SR) 150 MG PO TB12: 150.0000 mg | ORAL_TABLET | Freq: Three times a day (TID) | ORAL | 2 refills | Status: DC

## 2021-08-08 MED ORDER — CARBAMAZEPINE ER 200 MG PO TB12: ORAL_TABLET | ORAL | 2 refills | Status: DC

## 2021-08-08 MED ORDER — ALPRAZOLAM 2 MG PO TABS: ORAL_TABLET | ORAL | 2 refills | Status: DC

## 2021-08-08 MED ORDER — ZOLPIDEM TARTRATE ER 12.5 MG PO TBCR: 12.5000 mg | EXTENDED_RELEASE_TABLET | Freq: Every evening | ORAL | 2 refills | Status: DC | PRN

## 2021-08-08 MED ORDER — LURASIDONE HCL 120 MG PO TABS: 120.0000 mg | ORAL_TABLET | Freq: Every day | ORAL | 2 refills | Status: DC

## 2021-08-08 NOTE — Progress Notes (Signed)
Virtual Visit via Video Note  I connected with Edward Lutz on 08/08/21 at  9:00 AM EDT by a video enabled telemedicine application and verified that I am speaking with the correct person using two identifiers.  Location: Patient: home Provider: home office   I discussed the limitations of evaluation and management by telemedicine and the availability of in person appointments. The patient expressed understanding and agreed to proceed.    I discussed the assessment and treatment plan with the patient. The patient was provided an opportunity to ask questions and all were answered. The patient agreed with the plan and demonstrated an understanding of the instructions.   The patient was advised to call back or seek an in-person evaluation if the symptoms worsen or if the condition fails to improve as anticipated.  I provided 15 minutes of non-face-to-face time during this encounter.   [name], MD  [facility] MD/PA/NP OP Progress Note  08/08/2021 9:27 AM Edward Lutz  MRN:  [id number]  Chief Complaint:  Chief Complaint   Depression; Anxiety; Manic Behavior; Follow-up    HPI: This patient is a40 year old married white male who lives with his wife, a daughter 66 years old and a boy 39 years old in [place].  He and his wife  have custody of their 3 and 47-year-old nephew.s he is on disability.   The patient states that he has a history of both substance abuse and bipolar disorder. About 10 years ago he was heavily using crack cocaine and marijuana and alcohol. He developed severe mood issues and was diagnosed as bipolar. He was hospitalized twice in 2009 at [facility] for suicide attempts. He he still has significant problems with short-term memory and temper but he feels he is more stable and he's ever been. He thinks his current combination of medicines has been very helpful. His mood is stable and he is able to take care of his family while his wife works and he is  sleeping well   The patient returns for follow-up after 3 months.  He states that he has been doing fairly well.  He has been on Ozempic for from his primary doctor, Dr. Margo Aye to help him lose weight and because he is prediabetic.  He has lost about 40 pounds.  He also takes phentermine to help with cravings.  Since he has been on the Ozempic however he has noticed that his eyes are fluttering up and down a bit more and his vision is more blurred.  I noted that he is on quite a few different medications that can have interactions particularly Wellbutrin plus stimulant.  Ozempic does have blurred vision listed as a side effect.  He is going to discuss this with Dr. Margo Aye.  I suggested that at some point he have a complete ophthalmologic evaluation since he is claims to have had blurred vision "for years."  He does state that his mood is stable.  He is staying busy with the children.  His sleep is fairly good about 5 hours a night.  He denies serious depression or suicidal ideation.  His anxiety can get difficult at times and I urged him to cut his Xanax in half and take the half doses more frequently.  He has been taken off pain medication because he is on Xanax but is now getting injections in his back Visit Diagnosis:    ICD-10-CM   1. Bipolar 1 disorder (HCC)  F31.9       Past  Psychiatric History: Past admissions in 2009 for suicide attempts  Past Medical History:  Past Medical History:  Diagnosis Date   Back pain    Bipolar disorder (HCC)    HTN (hypertension)    OSA (obstructive sleep apnea)     Past Surgical History:  Procedure Laterality Date   RHINOPLASTY     TONSILLECTOMY      Family Psychiatric History: see below  Family History:  Family History  Problem Relation Age of Onset   Bipolar disorder Mother    Drug abuse Mother    Anxiety disorder Mother    Paranoid behavior Mother    Physical abuse Mother    Bipolar disorder Father    Alcohol abuse Father    Drug abuse Father     Anxiety disorder Father    Paranoid behavior Father    Physical abuse Father    Anxiety disorder Sister    Depression Sister    ADD / ADHD Daughter    Dementia Neg Hx    OCD Neg Hx    Schizophrenia Neg Hx    Seizures Neg Hx    Sexual abuse Neg Hx     Social History:  Social History   Socioeconomic History   Marital status: Married    Spouse name: Not on file   Number of children: Not on file   Years of education: Not on file   Highest education level: Not on file  Occupational History   Not on file  Tobacco Use   Smoking status: Every Day    Packs/day: 1.00    Years: 15.00    Pack years: 15.00    Types: Cigarettes   Smokeless tobacco: Never   Tobacco comments:    16-20 cigarettes a day as of 06/08/2013  Substance and Sexual Activity   Alcohol use: Yes    Alcohol/week: 36.0 standard drinks    Types: 36 Cans of beer per week    Comment: Reports drinks 5-6 beers a day.    Drug use: No   Sexual activity: Yes    Partners: Female    Birth control/protection: None  Other Topics Concern   Not on file  Social History Narrative   Not on file   Social Determinants of Health   Financial Resource Strain: Not on file  Food Insecurity: Not on file  Transportation Needs: Not on file  Physical Activity: Not on file  Stress: Not on file  Social Connections: Not on file    Allergies:  Allergies  Allergen Reactions   Penicillins Rash    Has patient had a PCN reaction causing immediate rash, facial/tongue/throat swelling, SOB or lightheadedness with hypotension: Yes Has patient had a PCN reaction causing severe rash involving mucus membranes or skin necrosis: No Has patient had a PCN reaction that required hospitalization No Has patient had a PCN reaction occurring within the last 10 years: No If all of the above answers are "NO", then may proceed with Cephalosporin use.     Metabolic Disorder Labs: Lab Results  Component Value Date   HGBA1C 5.6 12/30/2012   MPG  114 12/30/2012   No results found for: PROLACTIN No results found for: CHOL, TRIG, HDL, CHOLHDL, VLDL, LDLCALC No results found for: TSH  Therapeutic Level Labs: Lab Results  Component Value Date   LITHIUM 1.05 01/21/2012   LITHIUM 0.97 10/24/2011   Lab Results  Component Value Date   VALPROATE 60.5 06/16/2008   No components found for:  CBMZ  Current  Medications: Current Outpatient Medications  Medication Sig Dispense Refill   alprazolam (XANAX) 2 MG tablet TAKE ONE TABLET BY MOUTH 3 TIMES A DAY. 270 tablet 2   amLODipine (NORVASC) 10 MG tablet Take 5 mg by mouth at bedtime.      buPROPion (WELLBUTRIN SR) 150 MG 12 hr tablet Take 1 tablet (150 mg total) by mouth 3 (three) times daily. 90 tablet 2   busPIRone (BUSPAR) 30 MG tablet TAKE (1) TABLET BY MOUTH (3) TIMES DAILY. 90 tablet 0   carbamazepine (TEGRETOL XR) 200 MG 12 hr tablet TAKE 8 TABLETS BY MOUTH AT BEDTIME. 240 tablet 2   clindamycin (CLEOCIN T) 1 % SWAB      levETIRAcetam (KEPPRA) 500 MG tablet Take 1,000 mg by mouth 2 (two) times daily.      lisinopril-hydrochlorothiazide (PRINZIDE,ZESTORETIC) 20-12.5 MG tablet Take 1 tablet by mouth daily.      Lurasidone HCl 120 MG TABS Take 1 tablet (120 mg total) by mouth daily with supper. 90 tablet 2   minocycline (MINOCIN,DYNACIN) 100 MG capsule Take 100 mg by mouth daily.      phentermine (ADIPEX-P) 37.5 MG tablet Take 37.5 mg by mouth daily.     rosuvastatin (CRESTOR) 10 MG tablet Take 10 mg by mouth at bedtime.     Semaglutide,0.25 or 0.5MG /DOS, (OZEMPIC, 0.25 OR 0.5 MG/DOSE,) 2 MG/1.5ML SOPN Ozempic  daily     tamsulosin (FLOMAX) 0.4 MG CAPS capsule Take 0.4 mg by mouth daily.     tiZANidine (ZANAFLEX) 4 MG tablet Take 4 mg by mouth 3 (three) times daily.     tretinoin (RETIN-A) 0.05 % cream Apply 1 application topically 2 (two) times daily.     zolpidem (AMBIEN CR) 12.5 MG CR tablet Take 1 tablet (12.5 mg total) by mouth at bedtime as needed for sleep. 90 tablet 2   No  current facility-administered medications for this visit.     Musculoskeletal: Strength & Muscle Tone: within normal limits Gait & Station: normal Patient leans: N/A  Psychiatric Specialty Exam: Review of Systems  Eyes:  Positive for visual disturbance.  Musculoskeletal:  Positive for back pain.  Psychiatric/Behavioral:  The patient is nervous/anxious.   All other systems reviewed and are negative.  There were no vitals taken for this visit.There is no height or weight on file to calculate BMI.  General Appearance: Casual and Fairly Groomed  Eye Contact:  Good  Speech:  Clear and Coherent  Volume:  Normal  Mood:  Anxious and Euthymic  Affect:  Flat  Thought Process:  Goal Directed  Orientation:  Full (Time, Place, and Person)  Thought Content: Rumination   Suicidal Thoughts:  No  Homicidal Thoughts:  No  Memory:  Immediate;   Good Recent;   Good Remote;   Good  Judgement:  Good  Insight:  Fair  Psychomotor Activity:  Normal  Concentration:  Concentration: Good and Attention Span: Good  Recall:  Good  Fund of Knowledge: Good  Language: Good  Akathisia:  No  Handed:  Right  AIMS (if indicated): not done  Assets:  Communication Skills Desire for Improvement Resilience Social Support Talents/Skills  ADL's:  Intact  Cognition: WNL  Sleep:  Fair   Screenings: PHQ2-9    Flowsheet Row [facility] Video Visit from 03/29/2021 in [facility]  PHQ-2 Total Score 0 0      Flowsheet Row Video Visit from 08/08/2021 [facility] Video Visit from 03/29/2021 [facility]  C-SSRS RISK CATEGORY No Risk No Risk        Assessment and Plan: This patient is a 39 year old male with a history of bipolar disorder, depression obsessional symptoms.  I am concerned about his eye  symptoms but I would like him to discuss this with his primary doctor and perhaps with a visit to an ophthalmologist.  He is going to have labs next month when he returns for his family doctor and they will be sent over to me.  For now he will continue Wellbutrin SR 450 mg daily for depression, Tegretol 200 mg - 8 tablets at bedtime for mood stabilization, lurasidone 120 mg daily for mood stabilization, Ambien CR 12.5 mg at bedtime for sleep, Xanax 2 mg 3 times daily for anxiety and BuSpar 30 mg 3 times daily for anxiety.  He will return to see me in 3 months   [name], MD 08/08/2021, 9:27 AM

## 2021-08-22 DIAGNOSIS — M47816 Spondylosis without myelopathy or radiculopathy, lumbar region: Secondary | ICD-10-CM | POA: Diagnosis not present

## 2021-09-11 ENCOUNTER — Other Ambulatory Visit (HOSPITAL_COMMUNITY): Payer: Self-pay | Admitting: Psychiatry

## 2021-09-14 DIAGNOSIS — E7849 Other hyperlipidemia: Secondary | ICD-10-CM | POA: Diagnosis not present

## 2021-09-14 DIAGNOSIS — I10 Essential (primary) hypertension: Secondary | ICD-10-CM | POA: Diagnosis not present

## 2021-09-20 DIAGNOSIS — M7918 Myalgia, other site: Secondary | ICD-10-CM | POA: Diagnosis not present

## 2021-09-20 DIAGNOSIS — M47816 Spondylosis without myelopathy or radiculopathy, lumbar region: Secondary | ICD-10-CM | POA: Diagnosis not present

## 2021-10-02 DIAGNOSIS — M5416 Radiculopathy, lumbar region: Secondary | ICD-10-CM | POA: Diagnosis not present

## 2021-10-02 DIAGNOSIS — G5602 Carpal tunnel syndrome, left upper limb: Secondary | ICD-10-CM | POA: Diagnosis not present

## 2021-10-02 DIAGNOSIS — M5412 Radiculopathy, cervical region: Secondary | ICD-10-CM | POA: Diagnosis not present

## 2021-10-02 DIAGNOSIS — M47896 Other spondylosis, lumbar region: Secondary | ICD-10-CM | POA: Diagnosis not present

## 2021-10-02 DIAGNOSIS — M545 Low back pain, unspecified: Secondary | ICD-10-CM | POA: Diagnosis not present

## 2021-10-02 DIAGNOSIS — Z79891 Long term (current) use of opiate analgesic: Secondary | ICD-10-CM | POA: Diagnosis not present

## 2021-10-02 DIAGNOSIS — M79602 Pain in left arm: Secondary | ICD-10-CM | POA: Diagnosis not present

## 2021-10-02 DIAGNOSIS — I10 Essential (primary) hypertension: Secondary | ICD-10-CM | POA: Diagnosis not present

## 2021-10-10 DIAGNOSIS — R7301 Impaired fasting glucose: Secondary | ICD-10-CM | POA: Diagnosis not present

## 2021-10-10 DIAGNOSIS — E785 Hyperlipidemia, unspecified: Secondary | ICD-10-CM | POA: Diagnosis not present

## 2021-10-15 DIAGNOSIS — E7849 Other hyperlipidemia: Secondary | ICD-10-CM | POA: Diagnosis not present

## 2021-10-15 DIAGNOSIS — I10 Essential (primary) hypertension: Secondary | ICD-10-CM | POA: Diagnosis not present

## 2021-10-18 DIAGNOSIS — R7989 Other specified abnormal findings of blood chemistry: Secondary | ICD-10-CM | POA: Diagnosis not present

## 2021-10-18 DIAGNOSIS — Z135 Encounter for screening for eye and ear disorders: Secondary | ICD-10-CM | POA: Diagnosis not present

## 2021-10-18 DIAGNOSIS — R42 Dizziness and giddiness: Secondary | ICD-10-CM | POA: Diagnosis not present

## 2021-10-18 DIAGNOSIS — Z0001 Encounter for general adult medical examination with abnormal findings: Secondary | ICD-10-CM | POA: Diagnosis not present

## 2021-10-22 DIAGNOSIS — M47816 Spondylosis without myelopathy or radiculopathy, lumbar region: Secondary | ICD-10-CM | POA: Diagnosis not present

## 2021-10-31 ENCOUNTER — Other Ambulatory Visit (HOSPITAL_COMMUNITY): Payer: Self-pay | Admitting: Psychiatry

## 2021-11-01 DIAGNOSIS — R7989 Other specified abnormal findings of blood chemistry: Secondary | ICD-10-CM | POA: Diagnosis not present

## 2021-11-05 DIAGNOSIS — M47816 Spondylosis without myelopathy or radiculopathy, lumbar region: Secondary | ICD-10-CM | POA: Diagnosis not present

## 2021-11-07 ENCOUNTER — Telehealth (INDEPENDENT_AMBULATORY_CARE_PROVIDER_SITE_OTHER): Payer: Medicare Other | Admitting: Psychiatry

## 2021-11-07 ENCOUNTER — Other Ambulatory Visit: Payer: Self-pay

## 2021-11-07 ENCOUNTER — Encounter (HOSPITAL_COMMUNITY): Payer: Self-pay | Admitting: Psychiatry

## 2021-11-07 DIAGNOSIS — F319 Bipolar disorder, unspecified: Secondary | ICD-10-CM

## 2021-11-07 DIAGNOSIS — R7989 Other specified abnormal findings of blood chemistry: Secondary | ICD-10-CM | POA: Diagnosis not present

## 2021-11-07 DIAGNOSIS — I10 Essential (primary) hypertension: Secondary | ICD-10-CM | POA: Diagnosis not present

## 2021-11-07 DIAGNOSIS — R42 Dizziness and giddiness: Secondary | ICD-10-CM | POA: Diagnosis not present

## 2021-11-07 MED ORDER — BUSPIRONE HCL 30 MG PO TABS: ORAL_TABLET | ORAL | 2 refills | Status: DC

## 2021-11-07 MED ORDER — BUPROPION HCL ER (SR) 150 MG PO TB12: 150.0000 mg | ORAL_TABLET | Freq: Three times a day (TID) | ORAL | 2 refills | Status: DC

## 2021-11-07 MED ORDER — ALPRAZOLAM 2 MG PO TABS: ORAL_TABLET | ORAL | 2 refills | Status: DC

## 2021-11-07 MED ORDER — LURASIDONE HCL 120 MG PO TABS: 120.0000 mg | ORAL_TABLET | Freq: Every day | ORAL | 2 refills | Status: DC

## 2021-11-07 MED ORDER — CARBAMAZEPINE ER 200 MG PO TB12: ORAL_TABLET | ORAL | 2 refills | Status: DC

## 2021-11-07 MED ORDER — ZOLPIDEM TARTRATE ER 12.5 MG PO TBCR: 12.5000 mg | EXTENDED_RELEASE_TABLET | Freq: Every evening | ORAL | 2 refills | Status: DC | PRN

## 2021-11-07 NOTE — Progress Notes (Signed)
Virtual Visit via Video Note  I connected with Edward Lutz on 11/07/21 at  9:40 AM EST by a video enabled telemedicine application and verified that I am speaking with the correct person using two identifiers.  Location: Patient: home Provider: office   I discussed the limitations of evaluation and management by telemedicine and the availability of in person appointments. The patient expressed understanding and agreed to proceed.      I discussed the assessment and treatment plan with the patient. The patient was provided an opportunity to ask questions and all were answered. The patient agreed with the plan and demonstrated an understanding of the instructions.   The patient was advised to call back or seek an in-person evaluation if the symptoms worsen or if the condition fails to improve as anticipated.  I provided 15 minutes of non-face-to-face time during this encounter.   [name], MD  [facility] MD/PA/NP OP Progress Note  11/07/2021 10:16 AM Edward Lutz  MRN:  [id number]  Chief Complaint:  Chief Complaint   Depression; Anxiety; Manic Behavior; Follow-up    HPI: This patient is a40 year old married white male who lives with his wife, a daughter 35 years old and a boy 51 years old in [place].  He and his wife  have custody of their 36 and 64-year-old nephew.s he is on disability.   The patient states that he has a history of both substance abuse and bipolar disorder. About 10 years ago he was heavily using crack cocaine and marijuana and alcohol. He developed severe mood issues and was diagnosed as bipolar. He was hospitalized twice in 2009 at [facility] for suicide attempts. He he still has significant problems with short-term memory and temper but he feels he is more stable and he's ever been. He thinks his current combination of medicines has been very helpful. His mood is stable and he is able to take care of his family while his wife works and he  is sleeping well   The patient returns for follow-up after 3 months.  He states for the most part he is doing okay.  He stays very busy  with the younger children in the family.  He does not always sleep that well but he is able to deal with it.  He states his mood is generally stable.  He still has good and bad days but is on a much more even keel than he used to be.  He denies any thoughts of self-harm or suicidal ideation.  His anxiety is under good control. Visit Diagnosis:    ICD-10-CM   1. Bipolar 1 disorder (HCC)  F31.9       Past Psychiatric History: Past admissions in 2009 for suicide attempts  Past Medical History:  Past Medical History:  Diagnosis Date   Back pain    Bipolar disorder (HCC)    HTN (hypertension)    OSA (obstructive sleep apnea)     Past Surgical History:  Procedure Laterality Date   RHINOPLASTY     TONSILLECTOMY      Family Psychiatric History: see below  Family History:  Family History  Problem Relation Age of Onset   Bipolar disorder Mother    Drug abuse Mother    Anxiety disorder Mother    Paranoid behavior Mother    Physical abuse Mother    Bipolar disorder Father    Alcohol abuse Father    Drug abuse Father    Anxiety disorder Father  Paranoid behavior Father    Physical abuse Father    Anxiety disorder Sister    Depression Sister    ADD / ADHD Daughter    Dementia Neg Hx    OCD Neg Hx    Schizophrenia Neg Hx    Seizures Neg Hx    Sexual abuse Neg Hx     Social History:  Social History   Socioeconomic History   Marital status: Married    Spouse name: Not on file   Number of children: Not on file   Years of education: Not on file   Highest education level: Not on file  Occupational History   Not on file  Tobacco Use   Smoking status: Every Day    Packs/day: 1.00    Years: 15.00    Pack years: 15.00    Types: Cigarettes   Smokeless tobacco: Never   Tobacco comments:    16-20 cigarettes a day as of 06/08/2013   Substance and Sexual Activity   Alcohol use: Yes    Alcohol/week: 36.0 standard drinks    Types: 36 Cans of beer per week    Comment: Reports drinks 5-6 beers a day.    Drug use: No   Sexual activity: Yes    Partners: Female    Birth control/protection: None  Other Topics Concern   Not on file  Social History Narrative   Not on file   Social Determinants of Health   Financial Resource Strain: Not on file  Food Insecurity: Not on file  Transportation Needs: Not on file  Physical Activity: Not on file  Stress: Not on file  Social Connections: Not on file    Allergies:  Allergies  Allergen Reactions   Penicillins Rash    Has patient had a PCN reaction causing immediate rash, facial/tongue/throat swelling, SOB or lightheadedness with hypotension: Yes Has patient had a PCN reaction causing severe rash involving mucus membranes or skin necrosis: No Has patient had a PCN reaction that required hospitalization No Has patient had a PCN reaction occurring within the last 10 years: No If all of the above answers are "NO", then may proceed with Cephalosporin use.     Metabolic Disorder Labs: Lab Results  Component Value Date   HGBA1C 5.6 12/30/2012   MPG 114 12/30/2012   No results found for: PROLACTIN No results found for: CHOL, TRIG, HDL, CHOLHDL, VLDL, LDLCALC No results found for: TSH  Therapeutic Level Labs: Lab Results  Component Value Date   LITHIUM 1.05 01/21/2012   LITHIUM 0.97 10/24/2011   Lab Results  Component Value Date   VALPROATE 60.5 06/16/2008   No components found for:  CBMZ  Current Medications: Current Outpatient Medications  Medication Sig Dispense Refill   alprazolam (XANAX) 2 MG tablet TAKE ONE TABLET BY MOUTH 3 TIMES A DAY. 270 tablet 2   amLODipine (NORVASC) 10 MG tablet Take 5 mg by mouth at bedtime.      buPROPion (WELLBUTRIN SR) 150 MG 12 hr tablet Take 1 tablet (150 mg total) by mouth 3 (three) times daily. 90 tablet 2   busPIRone  (BUSPAR) 30 MG tablet TAKE (1) TABLET BY MOUTH (3) TIMES DAILY. 90 tablet 2   carbamazepine (TEGRETOL XR) 200 MG 12 hr tablet TAKE 8 TABLETS BY MOUTH AT BEDTIME. 240 tablet 2   clindamycin (CLEOCIN T) 1 % SWAB      levETIRAcetam (KEPPRA) 500 MG tablet Take 1,000 mg by mouth 2 (two) times daily.  lisinopril-hydrochlorothiazide (PRINZIDE,ZESTORETIC) 20-12.5 MG tablet Take 1 tablet by mouth daily.      Lurasidone HCl 120 MG TABS Take 1 tablet (120 mg total) by mouth daily with supper. 90 tablet 2   minocycline (MINOCIN,DYNACIN) 100 MG capsule Take 100 mg by mouth daily.      phentermine (ADIPEX-P) 37.5 MG tablet Take 37.5 mg by mouth daily.     rosuvastatin (CRESTOR) 10 MG tablet Take 10 mg by mouth at bedtime.     Semaglutide,0.25 or 0.5MG /DOS, (OZEMPIC, 0.25 OR 0.5 MG/DOSE,) 2 MG/1.5ML SOPN Ozempic  daily     tamsulosin (FLOMAX) 0.4 MG CAPS capsule Take 0.4 mg by mouth daily.     tiZANidine (ZANAFLEX) 4 MG tablet Take 4 mg by mouth 3 (three) times daily.     tretinoin (RETIN-A) 0.05 % cream Apply 1 application topically 2 (two) times daily.     zolpidem (AMBIEN CR) 12.5 MG CR tablet Take 1 tablet (12.5 mg total) by mouth at bedtime as needed for sleep. 90 tablet 2   No current facility-administered medications for this visit.     Musculoskeletal: Strength & Muscle Tone: within normal limits Gait & Station: normal Patient leans: N/A  Psychiatric Specialty Exam: Review of Systems  Musculoskeletal:  Positive for back pain.  All other systems reviewed and are negative.  There were no vitals taken for this visit.There is no height or weight on file to calculate BMI.  General Appearance: Casual and Fairly Groomed  Eye Contact:  Good  Speech:  Clear and Coherent  Volume:  Normal  Mood:  Euthymic  Affect:  Appropriate and Congruent  Thought Process:  Goal Directed  Orientation:  Full (Time, Place, and Person)  Thought Content: WDL   Suicidal Thoughts:  No  Homicidal Thoughts:  No   Memory:  Immediate;   Good Recent;   Good Remote;   Good  Judgement:  Good  Insight:  Fair  Psychomotor Activity:  Normal  Concentration:  Concentration: Good and Attention Span: Good  Recall:  Good  Fund of Knowledge: Good  Language: Good  Akathisia:  No  Handed:  Right  AIMS (if indicated): not done  Assets:  Communication Skills Desire for Improvement Resilience Social Support Talents/Skills  ADL's:  Intact  Cognition: WNL  Sleep:  Fair   Screenings: PHQ2-9    Flowsheet Row Video Visit from 11/07/2021 [facility] Video Visit from 08/08/2021 in [facility] Video Visit from 03/29/2021 in [facility]  PHQ-2 Total Score 1 0 0      Flowsheet Row Video Visit from 11/07/2021 in [facility] Video Visit from 08/08/2021 in [facility] Video Visit from 03/29/2021 in [facility]  C-SSRS RISK CATEGORY No Risk No Risk No Risk        Assessment and Plan: This patient is a 40 year old male with a history bipolar disorder depression and obsessional symptoms.  He is doing well on his current regimen.  He will continue Wellbutrin SR 450 mg daily for depression, Tegretol 200 mg - 8 tablets at bedtime for mood stabilization, lurasidone 120 mg daily for mood stabilization, Ambien CR 12.5 mg at bedtime for sleep, Xanax 2 mg 3 times daily for anxiety and BuSpar 30 mg 3 times daily also for anxiety.  He will return to see me in 63-month   [name], MD 11/07/2021, 10:16 AM

## 2021-11-15 ENCOUNTER — Other Ambulatory Visit: Payer: Self-pay

## 2021-11-15 ENCOUNTER — Ambulatory Visit
Admission: EM | Admit: 2021-11-15 | Discharge: 2021-11-15 | Disposition: A | Discharge status: Home / Self Care | Payer: Medicare Other | Attending: Urgent Care | Admitting: Urgent Care

## 2021-11-15 ENCOUNTER — Encounter: Payer: Self-pay | Admitting: Emergency Medicine

## 2021-11-15 DIAGNOSIS — B349 Viral infection, unspecified: Secondary | ICD-10-CM | POA: Diagnosis not present

## 2021-11-15 DIAGNOSIS — Z20828 Contact with and (suspected) exposure to other viral communicable diseases: Secondary | ICD-10-CM

## 2021-11-15 DIAGNOSIS — J3489 Other specified disorders of nose and nasal sinuses: Secondary | ICD-10-CM

## 2021-11-15 DIAGNOSIS — F17200 Nicotine dependence, unspecified, uncomplicated: Secondary | ICD-10-CM

## 2021-11-15 DIAGNOSIS — R052 Subacute cough: Secondary | ICD-10-CM

## 2021-11-15 MED ORDER — BENZONATATE 100 MG PO CAPS: 100.0000 mg | ORAL_CAPSULE | Freq: Three times a day (TID) | ORAL | 0 refills | Status: AC | PRN

## 2021-11-15 MED ORDER — PREDNISONE 20 MG PO TABS: ORAL_TABLET | ORAL | 0 refills | Status: DC

## 2021-11-15 MED ORDER — PROMETHAZINE-DM 6.25-15 MG/5ML PO SYRP: 5.0000 mL | ORAL_SOLUTION | Freq: Every evening | ORAL | 0 refills | Status: DC | PRN

## 2021-11-15 MED ORDER — ALBUTEROL SULFATE HFA 108 (90 BASE) MCG/ACT IN AERS: 1.0000 | INHALATION_SPRAY | Freq: Four times a day (QID) | RESPIRATORY_TRACT | 0 refills | Status: AC | PRN

## 2021-11-15 NOTE — ED Triage Notes (Signed)
Cough, runny nose, congestion since Sunday.  Daughter tested positive for flu a few days ago.

## 2021-11-15 NOTE — Discharge Instructions (Addendum)
We will notify you of your test results as they arrive and may take between 48-72 hours.  I encourage you to sign up for MyChart if you have not already done so as this can be the easiest way for us to communicate results to you online or through a phone app.  Generally, we only contact you if it is a positive test result.  In the meantime, if you develop worsening symptoms including fever, chest pain, shortness of breath despite our current treatment plan then please report to the emergency room as this may be a sign of worsening status from possible viral infection.  Otherwise, we will manage this as a viral syndrome. For sore throat or cough try using a honey-based tea. Use 3 teaspoons of honey with juice squeezed from half lemon. Place shaved pieces of ginger into 1/2-1 cup of water and warm over stove top. Then mix the ingredients and repeat every 4 hours as needed. Please take Tylenol 500mg-650mg every 6 hours for aches and pains, fevers. Hydrate very well with at least 2 liters of water. Eat light meals such as soups to replenish electrolytes and soft fruits, veggies. Start an antihistamine like Zyrtec for postnasal drainage, sinus congestion.   

## 2021-11-15 NOTE — ED Provider Notes (Signed)
[facility]   MRN: [id number] DOB: Jun 14, 1981  Subjective:   Edward Lutz is a 40 y.o. male presenting for 5-day history of acute onset persistent coughing, chest congestion, intermittent shortness of breath.  Has also had a runny and stuffy nose. His daughter tested positive for influenza.  His wife is also been sick.  Patient is a smoker, does about 1 pack/day.  No history of asthma or COPD.  No current facility-administered medications for this encounter.  Current Outpatient Medications:    alprazolam (XANAX) 2 MG tablet, TAKE ONE TABLET BY MOUTH 3 TIMES A DAY., Disp: 270 tablet, Rfl: 2   amLODipine (NORVASC) 10 MG tablet, Take 5 mg by mouth at bedtime. , Disp: , Rfl:    buPROPion (WELLBUTRIN SR) 150 MG 12 hr tablet, Take 1 tablet (150 mg total) by mouth 3 (three) times daily., Disp: 90 tablet, Rfl: 2   busPIRone (BUSPAR) 30 MG tablet, TAKE (1) TABLET BY MOUTH (3) TIMES DAILY., Disp: 90 tablet, Rfl: 2   carbamazepine (TEGRETOL XR) 200 MG 12 hr tablet, TAKE 8 TABLETS BY MOUTH AT BEDTIME., Disp: 240 tablet, Rfl: 2   clindamycin (CLEOCIN T) 1 % SWAB, , Disp: , Rfl:    levETIRAcetam (KEPPRA) 500 MG tablet, Take 1,000 mg by mouth 2 (two) times daily. , Disp: , Rfl:    lisinopril-hydrochlorothiazide (PRINZIDE,ZESTORETIC) 20-12.5 MG tablet, Take 1 tablet by mouth daily. , Disp: , Rfl:    Lurasidone HCl 120 MG TABS, Take 1 tablet (120 mg total) by mouth daily with supper., Disp: 90 tablet, Rfl: 2   minocycline (MINOCIN,DYNACIN) 100 MG capsule, Take 100 mg by mouth daily. , Disp: , Rfl:    phentermine (ADIPEX-P) 37.5 MG tablet, Take 37.5 mg by mouth daily., Disp: , Rfl:    rosuvastatin (CRESTOR) 10 MG tablet, Take 10 mg by mouth at bedtime., Disp: , Rfl:    Semaglutide,0.25 or 0.5MG /DOS, (OZEMPIC, 0.25 OR 0.5 MG/DOSE,) 2 MG/1.5ML SOPN, Ozempic  daily, Disp: , Rfl:    tamsulosin (FLOMAX) 0.4 MG CAPS capsule, Take 0.4 mg by mouth daily., Disp: , Rfl:    tiZANidine (ZANAFLEX) 4  MG tablet, Take 4 mg by mouth 3 (three) times daily., Disp: , Rfl:    tretinoin (RETIN-A) 0.05 % cream, Apply 1 application topically 2 (two) times daily., Disp: , Rfl:    zolpidem (AMBIEN CR) 12.5 MG CR tablet, Take 1 tablet (12.5 mg total) by mouth at bedtime as needed for sleep., Disp: 90 tablet, Rfl: 2   Allergies  Allergen Reactions   Penicillins Rash    Has patient had a PCN reaction causing immediate rash, facial/tongue/throat swelling, SOB or lightheadedness with hypotension: Yes Has patient had a PCN reaction causing severe rash involving mucus membranes or skin necrosis: No Has patient had a PCN reaction that required hospitalization No Has patient had a PCN reaction occurring within the last 10 years: No If all of the above answers are "NO", then may proceed with Cephalosporin use.     Past Medical History:  Diagnosis Date   Back pain    Bipolar disorder (HCC)    HTN (hypertension)    OSA (obstructive sleep apnea)      Past Surgical History:  Procedure Laterality Date   RHINOPLASTY     TONSILLECTOMY      Family History  Problem Relation Age of Onset   Bipolar disorder Mother    Drug abuse Mother    Anxiety disorder Mother    Paranoid behavior  Mother    Physical abuse Mother    Bipolar disorder Father    Alcohol abuse Father    Drug abuse Father    Anxiety disorder Father    Paranoid behavior Father    Physical abuse Father    Anxiety disorder Sister    Depression Sister    ADD / ADHD Daughter    Dementia Neg Hx    OCD Neg Hx    Schizophrenia Neg Hx    Seizures Neg Hx    Sexual abuse Neg Hx     Social History   Tobacco Use   Smoking status: Every Day    Packs/day: 1.00    Years: 15.00    Pack years: 15.00    Types: Cigarettes   Smokeless tobacco: Never   Tobacco comments:    16-20 cigarettes a day as of 06/08/2013  Substance Use Topics   Alcohol use: Yes    Alcohol/week: 36.0 standard drinks    Types: 36 Cans of beer per week    Comment:  Reports drinks 5-6 beers a day.    Drug use: No    ROS   Objective:   Vitals: BP 120/75 (BP Location: Right Arm)   Pulse 76   Temp 98.1 F (36.7 C) (Oral)   Resp 18   SpO2 98%   Physical Exam Constitutional:      General: He is not in acute distress.    Appearance: Normal appearance. He is well-developed. He is not ill-appearing, toxic-appearing or diaphoretic.  HENT:     Head: Normocephalic and atraumatic.     Right Ear: External ear normal.     Left Ear: External ear normal.     Nose: Congestion and rhinorrhea present.     Mouth/Throat:     Mouth: Mucous membranes are moist.     Pharynx: Oropharynx is clear.  Eyes:     General: No scleral icterus.    Extraocular Movements: Extraocular movements intact.     Pupils: Pupils are equal, round, and reactive to light.  Cardiovascular:     Rate and Rhythm: Normal rate and regular rhythm.     Heart sounds: Normal heart sounds. No murmur heard.   No friction rub. No gallop.  Pulmonary:     Effort: Pulmonary effort is normal. No respiratory distress.     Breath sounds: Normal breath sounds. No stridor. No wheezing, rhonchi or rales.  Skin:    General: Skin is warm and dry.  Neurological:     Mental Status: He is alert and oriented to person, place, and time.  Psychiatric:        Mood and Affect: Mood normal.        Behavior: Behavior normal.        Thought Content: Thought content normal.     Assessment and Plan :   PDMP not reviewed this encounter.  1. Acute viral syndrome   2. Exposure to the flu   3. Subacute cough   4. Smoker   5. Stuffy and runny nose    He is outside the window of using Tamiflu.  Recommended an oral prednisone course in the context of his smoking and respiratory symptoms.  COVID and flu test pending.  We will otherwise manage for viral upper respiratory infection.  Physical exam findings reassuring and vital signs stable for discharge. Advised supportive care, offered symptomatic relief.  Deferred imaging given clear cardiopulmonary exam, hemodynamically stable vital signs. Counseled patient on potential for adverse effects with medications  prescribed/recommended today, ER and return-to-clinic precautions discussed, patient verbalized understanding.      Wallis Bamberg, PA-C 11/15/21 1106

## 2021-11-17 LAB — COVID-19, FLU A+B NAA
Influenza A, NAA: NOT DETECTED
Influenza B, NAA: NOT DETECTED
SARS-CoV-2, NAA: NOT DETECTED

## 2021-12-24 DIAGNOSIS — M5416 Radiculopathy, lumbar region: Secondary | ICD-10-CM | POA: Diagnosis not present

## 2021-12-24 DIAGNOSIS — M47816 Spondylosis without myelopathy or radiculopathy, lumbar region: Secondary | ICD-10-CM | POA: Diagnosis not present

## 2022-01-09 DIAGNOSIS — R42 Dizziness and giddiness: Secondary | ICD-10-CM | POA: Diagnosis not present

## 2022-01-09 DIAGNOSIS — G5602 Carpal tunnel syndrome, left upper limb: Secondary | ICD-10-CM | POA: Diagnosis not present

## 2022-01-09 DIAGNOSIS — I10 Essential (primary) hypertension: Secondary | ICD-10-CM | POA: Diagnosis not present

## 2022-01-09 DIAGNOSIS — M545 Low back pain, unspecified: Secondary | ICD-10-CM | POA: Diagnosis not present

## 2022-01-09 DIAGNOSIS — M5412 Radiculopathy, cervical region: Secondary | ICD-10-CM | POA: Diagnosis not present

## 2022-01-09 DIAGNOSIS — Z79891 Long term (current) use of opiate analgesic: Secondary | ICD-10-CM | POA: Diagnosis not present

## 2022-01-17 ENCOUNTER — Other Ambulatory Visit (HOSPITAL_COMMUNITY): Payer: Self-pay | Admitting: Psychiatry

## 2022-02-01 ENCOUNTER — Encounter (HOSPITAL_COMMUNITY): Payer: Self-pay | Admitting: Psychiatry

## 2022-02-01 ENCOUNTER — Telehealth (INDEPENDENT_AMBULATORY_CARE_PROVIDER_SITE_OTHER): Payer: Medicare Other | Admitting: Psychiatry

## 2022-02-01 ENCOUNTER — Other Ambulatory Visit: Payer: Self-pay

## 2022-02-01 DIAGNOSIS — F319 Bipolar disorder, unspecified: Secondary | ICD-10-CM

## 2022-02-01 MED ORDER — BELSOMRA 20 MG PO TABS: 20.0000 mg | ORAL_TABLET | Freq: Every day | ORAL | 2 refills | Status: DC

## 2022-02-01 MED ORDER — BUSPIRONE HCL 30 MG PO TABS: ORAL_TABLET | ORAL | 2 refills | Status: DC

## 2022-02-01 MED ORDER — LURASIDONE HCL 120 MG PO TABS
120.0000 mg | ORAL_TABLET | Freq: Every day | ORAL | 2 refills | Status: DC
Start: 2022-02-01 — End: 2022-05-01

## 2022-02-01 MED ORDER — BUPROPION HCL ER (SR) 150 MG PO TB12: 150.0000 mg | ORAL_TABLET | Freq: Three times a day (TID) | ORAL | 2 refills | Status: DC

## 2022-02-01 MED ORDER — ALPRAZOLAM 2 MG PO TABS: ORAL_TABLET | ORAL | 2 refills | Status: DC

## 2022-02-01 MED ORDER — CARBAMAZEPINE ER 200 MG PO TB12: ORAL_TABLET | ORAL | 2 refills | Status: DC

## 2022-02-01 NOTE — Progress Notes (Signed)
Virtual Visit via Video Note  I connected with Edward Lutz on 02/01/22 at 10:00 AM EST by a video enabled telemedicine application and verified that I am speaking with the correct person using two identifiers.  Location: Patient: home Provider: office   I discussed the limitations of evaluation and management by telemedicine and the availability of in person appointments. The patient expressed understanding and agreed to proceed.     I discussed the assessment and treatment plan with the patient. The patient was provided an opportunity to ask questions and all were answered. The patient agreed with the plan and demonstrated an understanding of the instructions.   The patient was advised to call back or seek an in-person evaluation if the symptoms worsen or if the condition fails to improve as anticipated.  I provided 15 minutes of non-face-to-face time during this encounter.   [name], MD  [facility] MD/PA/NP OP Progress Note  02/01/2022 10:21 AM Edward Lutz  MRN:  [id number]  Chief Complaint:  Chief Complaint  Patient presents with   Anxiety   Depression   Manic Behavior   Follow-up   HPI: This patient is a41 year old married white male who lives with his wife, a daughter 19years old and a boy 42 years old in [place].  He and his wife  have custody of their 75 and 42-year-old nephew.s he is on disability.   The patient states that he has a history of both substance abuse and bipolar disorder. About 10 years ago he was heavily using crack cocaine and marijuana and alcohol. He developed severe mood issues and was diagnosed as bipolar. He was hospitalized twice in 2009 at [facility] for suicide attempts. He he still has significant problems with short-term memory and temper but he feels he is more stable and he's ever been. He thinks his current combination of medicines has been very helpful. His mood is stable and he is able to take care of his family while  his wife works and he is sleeping well   The patient returns for follow-up after 3 months regarding his bipolar disorder and insomnia.  He states for the most part he is doing okay but his mood still fluctuates.  About twice a week he loses his temper pretty badly and arguments with his wife.  He always feels bad afterwards.  He states that he is only sleeping about 3 hours and wakes up for quite a while and then cannot get back to sleep.  Sometimes he takes Xanax.  He has been on Ambien CR for quite a while.  He is also tried [place] and trazodone and Restoril.  I suggested that we try Belsomra and he is willing to do so.  He denies thoughts of self-harm or suicidal ideation. Visit Diagnosis:    ICD-10-CM   1. Bipolar 1 disorder (HCC)  F31.9       Past Psychiatric History: Past admissions in 2009 for suicide attempts  Past Medical History:  Past Medical History:  Diagnosis Date   Back pain    Bipolar disorder (HCC)    HTN (hypertension)    OSA (obstructive sleep apnea)     Past Surgical History:  Procedure Laterality Date   RHINOPLASTY     TONSILLECTOMY      Family Psychiatric History: See below  Family History:  Family History  Problem Relation Age of Onset   Bipolar disorder Mother    Drug abuse Mother    Anxiety disorder Mother  Paranoid behavior Mother    Physical abuse Mother    Bipolar disorder Father    Alcohol abuse Father    Drug abuse Father    Anxiety disorder Father    Paranoid behavior Father    Physical abuse Father    Anxiety disorder Sister    Depression Sister    ADD / ADHD Daughter    Dementia Neg Hx    OCD Neg Hx    Schizophrenia Neg Hx    Seizures Neg Hx    Sexual abuse Neg Hx     Social History:  Social History   Socioeconomic History   Marital status: Married    Spouse name: Not on file   Number of children: Not on file   Years of education: Not on file   Highest education level: Not on file  Occupational History   Not on file   Tobacco Use   Smoking status: Every Day    Packs/day: 1.00    Years: 15.00    Pack years: 15.00    Types: Cigarettes   Smokeless tobacco: Never   Tobacco comments:    16-20 cigarettes a day as of 06/08/2013  Substance and Sexual Activity   Alcohol use: Yes    Alcohol/week: 36.0 standard drinks    Types: 36 Cans of beer per week    Comment: Reports drinks 5-6 beers a day.    Drug use: No   Sexual activity: Yes    Partners: Female    Birth control/protection: None  Other Topics Concern   Not on file  Social History Narrative   Not on file   Social Determinants of Health   Financial Resource Strain: Not on file  Food Insecurity: Not on file  Transportation Needs: Not on file  Physical Activity: Not on file  Stress: Not on file  Social Connections: Not on file    Allergies:  Allergies  Allergen Reactions   Penicillins Rash    Has patient had a PCN reaction causing immediate rash, facial/tongue/throat swelling, SOB or lightheadedness with hypotension: Yes Has patient had a PCN reaction causing severe rash involving mucus membranes or skin necrosis: No Has patient had a PCN reaction that required hospitalization No Has patient had a PCN reaction occurring within the last 10 years: No If all of the above answers are "NO", then may proceed with Cephalosporin use.     Metabolic Disorder Labs: Lab Results  Component Value Date   HGBA1C 5.6 12/30/2012   MPG 114 12/30/2012   No results found for: PROLACTIN No results found for: CHOL, TRIG, HDL, CHOLHDL, VLDL, LDLCALC No results found for: TSH  Therapeutic Level Labs: Lab Results  Component Value Date   LITHIUM 1.05 01/21/2012   LITHIUM 0.97 10/24/2011   Lab Results  Component Value Date   VALPROATE 60.5 06/16/2008   No components found for:  CBMZ  Current Medications: Current Outpatient Medications  Medication Sig Dispense Refill   Suvorexant (BELSOMRA) 20 MG TABS Take 20 mg by mouth at bedtime. 30 tablet 2    albuterol (VENTOLIN HFA) 108 (90 Base) MCG/ACT inhaler Inhale 1-2 puffs into the lungs every 6 (six) hours as needed for wheezing or shortness of breath. 18 g 0   alprazolam (XANAX) 2 MG tablet TAKE ONE TABLET BY MOUTH 3 TIMES A DAY. 270 tablet 2   amLODipine (NORVASC) 10 MG tablet Take 5 mg by mouth at bedtime.      benzonatate (TESSALON) 100 MG capsule Take 1-2 capsules (  100-200 mg total) by mouth 3 (three) times daily as needed for cough. 60 capsule 0   buPROPion (WELLBUTRIN SR) 150 MG 12 hr tablet Take 1 tablet (150 mg total) by mouth 3 (three) times daily. 90 tablet 2   busPIRone (BUSPAR) 30 MG tablet TAKE (1) TABLET BY MOUTH (3) TIMES DAILY. 90 tablet 2   carbamazepine (TEGRETOL XR) 200 MG 12 hr tablet TAKE 8 TABLETS BY MOUTH AT BEDTIME. 240 tablet 2   clindamycin (CLEOCIN T) 1 % SWAB      levETIRAcetam (KEPPRA) 500 MG tablet Take 1,000 mg by mouth 2 (two) times daily.      lisinopril-hydrochlorothiazide (PRINZIDE,ZESTORETIC) 20-12.5 MG tablet Take 1 tablet by mouth daily.      Lurasidone HCl 120 MG TABS Take 1 tablet (120 mg total) by mouth daily with supper. 90 tablet 2   minocycline (MINOCIN,DYNACIN) 100 MG capsule Take 100 mg by mouth daily.      oxyCODONE-acetaminophen (PERCOCET) 10-325 MG tablet Take 1 tablet by mouth every 6 (six) hours as needed.     phentermine (ADIPEX-P) 37.5 MG tablet Take 37.5 mg by mouth daily.     promethazine-dextromethorphan (PROMETHAZINE-DM) 6.25-15 MG/5ML syrup Take 5 mLs by mouth at bedtime as needed for cough. 100 mL 0   rosuvastatin (CRESTOR) 10 MG tablet Take 10 mg by mouth at bedtime.     Semaglutide,0.25 or 0.5MG /DOS, (OZEMPIC, 0.25 OR 0.5 MG/DOSE,) 2 MG/1.5ML SOPN Ozempic  daily     tamsulosin (FLOMAX) 0.4 MG CAPS capsule Take 0.4 mg by mouth daily.     tiZANidine (ZANAFLEX) 4 MG tablet Take 4 mg by mouth 3 (three) times daily.     tretinoin (RETIN-A) 0.05 % cream Apply 1 application topically 2 (two) times daily.     zolpidem (AMBIEN CR) 12.5  MG CR tablet Take 1 tablet (12.5 mg total) by mouth at bedtime as needed for sleep. 90 tablet 2   No current facility-administered medications for this visit.     Musculoskeletal: Strength & Muscle Tone: within normal limits Gait & Station: normal Patient leans: N/A  Psychiatric Specialty Exam: Review of Systems  Musculoskeletal:  Positive for back pain.  Psychiatric/Behavioral:  Positive for sleep disturbance.   All other systems reviewed and are negative.  There were no vitals taken for this visit.There is no height or weight on file to calculate BMI.  General Appearance: Casual and Fairly Groomed  Eye Contact:  Good  Speech:  Clear and Coherent  Volume:  Normal  Mood:  Irritable  Affect:  Appropriate and Congruent  Thought Process:  Goal Directed  Orientation:  Full (Time, Place, and Person)  Thought Content: Rumination   Suicidal Thoughts:  No  Homicidal Thoughts:  No  Memory:  Immediate;   Good Recent;   Fair Remote;   Fair  Judgement:  Good  Insight:  Fair  Psychomotor Activity:  Decreased  Concentration:  Concentration: Good and Attention Span: Good  Recall:  Good  Fund of Knowledge: Good  Language: Good  Akathisia:  No  Handed:  Right  AIMS (if indicated): not done  Assets:  Communication Skills Desire for Improvement Resilience Social Support Talents/Skills  ADL's:  Intact  Cognition: WNL  Sleep:  Poor   Screenings: PHQ2-9    Flowsheet Row [facility] Video Visit from 11/07/2021 [facility] Video Visit from 08/08/2021 in [facility] Video Visit from [facility]2021 [facility]  PHQ-2 Total Score 1 1 0 0      Flowsheet Row Video Visit from 02/01[facility]2022 [facility] ED from [facility]15/2021 in [facility] Video Visit from 11/07/2021 [facility]  C-SSRS RISK CATEGORY No Risk No Risk No Risk        Assessment and Plan: This patient is a 41 year old male with a history of bipolar disorder depression and obsessional symptoms.  He is not sleeping well so we will discontinue Ambien CR in favor of Belsomra 20 mg at bedtime.  He will continue Wellbutrin SR 450 mg daily for depression, Tegretol 200 mg - 8 tablets at bedtime for mood stabilization, lurasidone 120 mg daily for mood stabilization, Xanax 2 mg 3 times daily for anxiety and BuSpar 30 mg 3 times daily also for anxiety.  He will return to see me in 3 months  Collaboration of Care: Collaboration of Care: Primary Care Provider AEB chart notes will be made available to primary physician at patient's request  Patient/Guardian was advised Release of Information must be obtained prior to any record release in order to collaborate their care with an outside provider. Patient/Guardian was advised if they have not already done so to contact the registration department to sign all necessary forms in order for [place] to release information regarding their care.   Consent: Patient/Guardian gives verbal consent for treatment and assignment of benefits for services provided during this visit. Patient/Guardian expressed understanding and agreed to proceed.    [name], MD 02/01/2022, 10:21 AM

## 2022-03-06 ENCOUNTER — Other Ambulatory Visit (HOSPITAL_COMMUNITY): Payer: Self-pay | Admitting: Neurology

## 2022-03-06 ENCOUNTER — Other Ambulatory Visit: Payer: Self-pay | Admitting: Neurology

## 2022-03-06 DIAGNOSIS — Z79891 Long term (current) use of opiate analgesic: Secondary | ICD-10-CM | POA: Diagnosis not present

## 2022-03-06 DIAGNOSIS — M545 Low back pain, unspecified: Secondary | ICD-10-CM | POA: Diagnosis not present

## 2022-03-06 DIAGNOSIS — R569 Unspecified convulsions: Secondary | ICD-10-CM

## 2022-03-06 DIAGNOSIS — I10 Essential (primary) hypertension: Secondary | ICD-10-CM | POA: Diagnosis not present

## 2022-03-06 DIAGNOSIS — G603 Idiopathic progressive neuropathy: Secondary | ICD-10-CM | POA: Diagnosis not present

## 2022-03-28 ENCOUNTER — Ambulatory Visit (HOSPITAL_COMMUNITY)
Admission: RE | Admit: 2022-03-28 | Discharge: 2022-03-28 | Disposition: A | Discharge status: Home / Self Care | Payer: Medicare Other | Source: Ambulatory Visit | Attending: Neurology | Admitting: Neurology

## 2022-03-28 DIAGNOSIS — R42 Dizziness and giddiness: Secondary | ICD-10-CM | POA: Diagnosis not present

## 2022-03-28 DIAGNOSIS — R569 Unspecified convulsions: Secondary | ICD-10-CM | POA: Diagnosis not present

## 2022-03-28 MED ORDER — GADOBUTROL 1 MMOL/ML IV SOLN
10.0000 mL | Freq: Once | INTRAVENOUS | Status: AC | PRN
  Administered 2022-03-28: 10 mL via INTRAVENOUS

## 2022-04-08 ENCOUNTER — Other Ambulatory Visit (HOSPITAL_COMMUNITY): Payer: Self-pay | Admitting: Psychiatry

## 2022-04-29 ENCOUNTER — Other Ambulatory Visit (HOSPITAL_COMMUNITY): Payer: Self-pay | Admitting: Psychiatry

## 2022-05-01 ENCOUNTER — Encounter (HOSPITAL_COMMUNITY): Payer: Self-pay | Admitting: Psychiatry

## 2022-05-01 ENCOUNTER — Telehealth (INDEPENDENT_AMBULATORY_CARE_PROVIDER_SITE_OTHER): Payer: Medicare Other | Admitting: Psychiatry

## 2022-05-01 DIAGNOSIS — F319 Bipolar disorder, unspecified: Secondary | ICD-10-CM | POA: Diagnosis not present

## 2022-05-01 MED ORDER — LURASIDONE HCL 120 MG PO TABS: 120.0000 mg | ORAL_TABLET | Freq: Every day | ORAL | 2 refills | Status: DC

## 2022-05-01 MED ORDER — BELSOMRA 20 MG PO TABS: 20.0000 mg | ORAL_TABLET | Freq: Every day | ORAL | 2 refills | Status: DC

## 2022-05-01 MED ORDER — CARBAMAZEPINE ER 200 MG PO TB12: ORAL_TABLET | ORAL | 2 refills | Status: DC

## 2022-05-01 MED ORDER — ALPRAZOLAM 2 MG PO TABS: ORAL_TABLET | ORAL | 2 refills | Status: DC

## 2022-05-01 MED ORDER — BUSPIRONE HCL 30 MG PO TABS: ORAL_TABLET | ORAL | 2 refills | Status: DC

## 2022-05-01 MED ORDER — BUPROPION HCL ER (SR) 150 MG PO TB12: 150.0000 mg | ORAL_TABLET | Freq: Three times a day (TID) | ORAL | 0 refills | Status: DC
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
## 2022-05-03 DIAGNOSIS — E785 Hyperlipidemia, unspecified: Secondary | ICD-10-CM | POA: Diagnosis not present

## 2022-05-03 DIAGNOSIS — R7301 Impaired fasting glucose: Secondary | ICD-10-CM | POA: Diagnosis not present

## 2022-05-07 DIAGNOSIS — I10 Essential (primary) hypertension: Secondary | ICD-10-CM | POA: Diagnosis not present

## 2022-05-07 DIAGNOSIS — E785 Hyperlipidemia, unspecified: Secondary | ICD-10-CM | POA: Diagnosis not present

## 2022-05-07 DIAGNOSIS — G629 Polyneuropathy, unspecified: Secondary | ICD-10-CM | POA: Diagnosis not present

## 2022-05-07 DIAGNOSIS — R42 Dizziness and giddiness: Secondary | ICD-10-CM | POA: Diagnosis not present

## 2022-05-07 DIAGNOSIS — R7301 Impaired fasting glucose: Secondary | ICD-10-CM | POA: Diagnosis not present

## 2022-05-07 DIAGNOSIS — Z135 Encounter for screening for eye and ear disorders: Secondary | ICD-10-CM | POA: Diagnosis not present

## 2022-05-29 ENCOUNTER — Other Ambulatory Visit (HOSPITAL_COMMUNITY): Payer: Self-pay | Admitting: Psychiatry

## 2022-05-29 DIAGNOSIS — G603 Idiopathic progressive neuropathy: Secondary | ICD-10-CM | POA: Diagnosis not present

## 2022-05-29 DIAGNOSIS — M545 Low back pain, unspecified: Secondary | ICD-10-CM | POA: Diagnosis not present

## 2022-05-29 DIAGNOSIS — R569 Unspecified convulsions: Secondary | ICD-10-CM | POA: Diagnosis not present

## 2022-05-29 DIAGNOSIS — Z79891 Long term (current) use of opiate analgesic: Secondary | ICD-10-CM | POA: Diagnosis not present

## 2022-05-29 DIAGNOSIS — I10 Essential (primary) hypertension: Secondary | ICD-10-CM | POA: Diagnosis not present

## 2022-07-22 ENCOUNTER — Other Ambulatory Visit (HOSPITAL_COMMUNITY): Payer: Self-pay | Admitting: Psychiatry

## 2022-07-23 ENCOUNTER — Encounter (HOSPITAL_COMMUNITY): Payer: Self-pay | Admitting: Psychiatry

## 2022-07-23 ENCOUNTER — Telehealth (INDEPENDENT_AMBULATORY_CARE_PROVIDER_SITE_OTHER): Payer: Medicare Other | Admitting: Psychiatry

## 2022-07-23 DIAGNOSIS — F319 Bipolar disorder, unspecified: Secondary | ICD-10-CM

## 2022-07-23 MED ORDER — CARBAMAZEPINE ER 200 MG PO TB12: ORAL_TABLET | ORAL | 0 refills | Status: DC

## 2022-07-23 MED ORDER — BELSOMRA 20 MG PO TABS: 20.0000 mg | ORAL_TABLET | Freq: Every day | ORAL | 2 refills | Status: DC

## 2022-07-23 MED ORDER — LURASIDONE HCL 120 MG PO TABS: 120.0000 mg | ORAL_TABLET | Freq: Every day | ORAL | 2 refills | Status: DC

## 2022-07-23 MED ORDER — ALPRAZOLAM 2 MG PO TABS: ORAL_TABLET | ORAL | 2 refills | Status: DC

## 2022-07-23 MED ORDER — BUPROPION HCL ER (SR) 150 MG PO TB12: 150.0000 mg | ORAL_TABLET | Freq: Three times a day (TID) | ORAL | 2 refills | Status: DC

## 2022-07-23 MED ORDER — BUSPIRONE HCL 30 MG PO TABS: ORAL_TABLET | ORAL | 2 refills | Status: DC

## 2022-07-23 NOTE — Progress Notes (Signed)
Virtual Visit via Video Note  I connected with Edward Lutz on 07/23/22 at  9:00 AM EDT by a video enabled telemedicine application and verified that I am speaking with the correct person using two identifiers.  Location: Patient: home Provider: office   I discussed the limitations of evaluation and management by telemedicine and the availability of in person appointments. The patient expressed understanding and agreed to proceed.     I discussed the assessment and treatment plan with the patient. The patient was provided an opportunity to ask questions and all were answered. The patient agreed with the plan and demonstrated an understanding of the instructions.   The patient was advised to call back or seek an in-person evaluation if the symptoms worsen or if the condition fails to improve as anticipated.  I provided 15 minutes of non-face-to-face time during this encounter.   [name], MD  [facility] MD/PA/NP OP Progress Note  07/23/2022 9:34 AM Edward Lutz  MRN:  [id number]  Chief Complaint:  Chief Complaint  Patient presents with   Anxiety   Depression   Manic Behavior   HPI: This patient is a52 year old married white male who lives with his wife, a daughter 48years old and a boy 47 years old in [place].  He and his wife  have custody of their 48 and 38-year-old nephew.s he is on disability  The patient returns after 3 months regarding his bipolar disorder.  He states overall he is doing okay.  He is having less stress now because his mother is no longer living with him.  His sister is out of jail but she has not come around to see her sons.  He is trying to get custody of her 17-year-old son.  Overall his mood has been generally stable.  He states he and his wife argue a lot because she thinks he is too snappy and irritable.  He states that he will try to work on this.  He still drinks a fair amount of beer and I encouraged him to moderate this.  He does think the  medications are helping to keep his mood fairly stable and he denies significant depression anxiety thoughts of self harm or suicide.  He is sleeping fairly well with the Belsomra.  He denies any current manic episodes. Visit Diagnosis:    ICD-10-CM   1. Bipolar 1 disorder (HCC)  F31.9       Past Psychiatric History: Past admissions in 2009 for suicide attempt  Past Medical History:  Past Medical History:  Diagnosis Date   Back pain    Bipolar disorder (HCC)    HTN (hypertension)    OSA (obstructive sleep apnea)     Past Surgical History:  Procedure Laterality Date   RHINOPLASTY     TONSILLECTOMY      Family Psychiatric History: See below  Family History:  Family History  Problem Relation Age of Onset   Bipolar disorder Mother    Drug abuse Mother    Anxiety disorder Mother    Paranoid behavior Mother    Physical abuse Mother    Bipolar disorder Father    Alcohol abuse Father    Drug abuse Father    Anxiety disorder Father    Paranoid behavior Father    Physical abuse Father    Anxiety disorder Sister    Depression Sister    ADD / ADHD Daughter    Dementia Neg Hx    OCD Neg Hx    Schizophrenia  Neg Hx    Seizures Neg Hx    Sexual abuse Neg Hx     Social History:  Social History   Socioeconomic History   Marital status: Married    Spouse name: Not on file   Number of children: Not on file   Years of education: Not on file   Highest education level: Not on file  Occupational History   Not on file  Tobacco Use   Smoking status: Every Day    Packs/day: 1.00    Years: 15.00    Total pack years: 15.00    Types: Cigarettes   Smokeless tobacco: Never   Tobacco comments:    16-20 cigarettes a day as of 06/08/2013  Substance and Sexual Activity   Alcohol use: Yes    Alcohol/week: 36.0 standard drinks of alcohol    Types: 36 Cans of beer per week    Comment: Reports drinks 5-6 beers a day.    Drug use: No   Sexual activity: Yes    Partners: Female     Birth control/protection: None  Other Topics Concern   Not on file  Social History Narrative   Not on file   Social Determinants of Health   Financial Resource Strain: Not on file  Food Insecurity: Not on file  Transportation Needs: Not on file  Physical Activity: Not on file  Stress: Not on file  Social Connections: Not on file    Allergies:  Allergies  Allergen Reactions   Penicillins Rash    Has patient had a PCN reaction causing immediate rash, facial/tongue/throat swelling, SOB or lightheadedness with hypotension: Yes Has patient had a PCN reaction causing severe rash involving mucus membranes or skin necrosis: No Has patient had a PCN reaction that required hospitalization No Has patient had a PCN reaction occurring within the last 10 years: No If all of the above answers are "NO", then may proceed with Cephalosporin use.     Metabolic Disorder Labs: Lab Results  Component Value Date   HGBA1C 5.6 12/30/2012   MPG 114 12/30/2012   No results found for: "PROLACTIN" No results found for: "CHOL", "TRIG", "HDL", "CHOLHDL", "VLDL", "LDLCALC" No results found for: "TSH"  Therapeutic Level Labs: Lab Results  Component Value Date   LITHIUM 1.05 01/21/2012   LITHIUM 0.97 10/24/2011   Lab Results  Component Value Date   VALPROATE 60.5 06/16/2008   Lab Results  Component Value Date   CBMZ 11.2 11/20/2016   CBMZ 9.1 08/03/2015    Current Medications: Current Outpatient Medications  Medication Sig Dispense Refill   albuterol (VENTOLIN HFA) 108 (90 Base) MCG/ACT inhaler Inhale 1-2 puffs into the lungs every 6 (six) hours as needed for wheezing or shortness of breath. 18 g 0   alprazolam (XANAX) 2 MG tablet TAKE ONE TABLET BY MOUTH 3 TIMES A DAY. 270 tablet 2   amLODipine (NORVASC) 10 MG tablet Take 5 mg by mouth at bedtime.      benzonatate (TESSALON) 100 MG capsule Take 1-2 capsules (100-200 mg total) by mouth 3 (three) times daily as needed for cough. 60 capsule 0    buPROPion (WELLBUTRIN SR) 150 MG 12 hr tablet Take 1 tablet (150 mg total) by mouth 3 (three) times daily. 90 tablet 2   busPIRone (BUSPAR) 30 MG tablet TAKE (1) TABLET BY MOUTH (3) TIMES DAILY. 90 tablet 2   carbamazepine (TEGRETOL XR) 200 MG 12 hr tablet TAKE 8 TABLETS BY MOUTH AT BEDTIME. 240 tablet 0  clindamycin (CLEOCIN T) 1 % SWAB      levETIRAcetam (KEPPRA) 500 MG tablet Take 1,000 mg by mouth 2 (two) times daily.      lisinopril-hydrochlorothiazide (PRINZIDE,ZESTORETIC) 20-12.5 MG tablet Take 1 tablet by mouth daily.      Lurasidone HCl 120 MG TABS Take 1 tablet (120 mg total) by mouth daily with supper. 90 tablet 2   minocycline (MINOCIN,DYNACIN) 100 MG capsule Take 100 mg by mouth daily.      oxyCODONE-acetaminophen (PERCOCET) 10-325 MG tablet Take 1 tablet by mouth every 6 (six) hours as needed.     phentermine (ADIPEX-P) 37.5 MG tablet Take 37.5 mg by mouth daily.     promethazine-dextromethorphan (PROMETHAZINE-DM) 6.25-15 MG/5ML syrup Take 5 mLs by mouth at bedtime as needed for cough. 100 mL 0   rosuvastatin (CRESTOR) 10 MG tablet Take 10 mg by mouth at bedtime.     Semaglutide,0.25 or 0.5MG /DOS, (OZEMPIC, 0.25 OR 0.5 MG/DOSE,) 2 MG/1.5ML SOPN Ozempic  daily     Suvorexant (BELSOMRA) 20 MG TABS Take 20 mg by mouth at bedtime. 30 tablet 2   tamsulosin (FLOMAX) 0.4 MG CAPS capsule Take 0.4 mg by mouth daily.     tiZANidine (ZANAFLEX) 4 MG tablet Take 4 mg by mouth 3 (three) times daily.     tretinoin (RETIN-A) 0.05 % cream Apply 1 application topically 2 (two) times daily.     No current facility-administered medications for this visit.     Musculoskeletal: Strength & Muscle Tone: within normal limits Gait & Station: normal Patient leans: N/A  Psychiatric Specialty Exam: Review of Systems  All other systems reviewed and are negative.   There were no vitals taken for this visit.There is no height or weight on file to calculate BMI.  General Appearance: Casual and  Fairly Groomed  Eye Contact:  Good  Speech:  Clear and Coherent  Volume:  Normal  Mood:  Euthymic  Affect:  Congruent  Thought Process:  Goal Directed  Orientation:  Full (Time, Place, and Person)  Thought Content: WDL and Rumination   Suicidal Thoughts:  No  Homicidal Thoughts:  No  Memory:  Immediate;   Good Recent;   Good Remote;   Good  Judgement:  Good  Insight:  Fair  Psychomotor Activity:  Normal  Concentration:  Concentration: Good and Attention Span: Good  Recall:  Good  Fund of Knowledge: Good  Language: Good  Akathisia:  No  Handed:  Right  AIMS (if indicated): not done  Assets:  Communication Skills Desire for Improvement Physical Health Resilience Social Support Talents/Skills  ADL's:  Intact  Cognition: WNL  Sleep:  Fair   Screenings: PHQ2-9    Flowsheet Row [facility] Video Visit from 05/01/2022 in [facility] Video Visit from 02/01/2022 in [facility] Video Visit from 11[facility]07/2021 in [facility] Video Visit from 08/08/2021 in [facility]  PHQ-2 Total Score 1 1 1 1  0      Flowsheet Row Video Visit from 07/23/2022 in [facility] Video Visit from 05/01/2022 in [facility] Video Visit from 02/01/2022 in [facility]  C-SSRS RISK CATEGORY No Risk No Risk No Risk        Assessment and Plan: This patient is a 41 year old male with a history of bipolar disorder depression and obsessional symptoms.  He is doing fairly well on  his current regimen.  He will continue Wellbutrin SR 450 mg daily for depression, Tegretol 200 mg - 8 tablets at bedtime for mood stabilization, Belsomra 20 mg at bedtime for sleep,  lurasidone 120 mg daily for mood stabilization, Xanax 2 mg 3 times daily for anxiety and BuSpar 30 mg 3 times daily for anxiety.  He will return to see me in 3 months  Collaboration of Care: Collaboration of Care: Primary Care Provider AEB notes will be provided to PCP at patient's request.  Patient reports he was recently seen there and had normal laboratories and improved cholesterol.  Patient/Guardian was advised Release of Information must be obtained prior to any record release in order to collaborate their care with an outside provider. Patient/Guardian was advised if they have not already done so to contact the registration department to sign all necessary forms in order for [place] to release information regarding their care.   Consent: Patient/Guardian gives verbal consent for treatment and assignment of benefits for services provided during this visit. Patient/Guardian expressed understanding and agreed to proceed.    [name], MD 07/23/2022, 9:34 AM

## 2022-08-21 DIAGNOSIS — I10 Essential (primary) hypertension: Secondary | ICD-10-CM | POA: Diagnosis not present

## 2022-08-21 DIAGNOSIS — M545 Low back pain, unspecified: Secondary | ICD-10-CM | POA: Diagnosis not present

## 2022-08-21 DIAGNOSIS — Z79891 Long term (current) use of opiate analgesic: Secondary | ICD-10-CM | POA: Diagnosis not present

## 2022-08-21 DIAGNOSIS — R569 Unspecified convulsions: Secondary | ICD-10-CM | POA: Diagnosis not present

## 2022-08-21 DIAGNOSIS — G603 Idiopathic progressive neuropathy: Secondary | ICD-10-CM | POA: Diagnosis not present

## 2022-08-21 DIAGNOSIS — R42 Dizziness and giddiness: Secondary | ICD-10-CM | POA: Diagnosis not present

## 2022-08-28 ENCOUNTER — Other Ambulatory Visit (HOSPITAL_COMMUNITY): Payer: Self-pay | Admitting: Psychiatry

## 2022-08-29 ENCOUNTER — Telehealth (HOSPITAL_COMMUNITY): Payer: Self-pay | Admitting: *Deleted

## 2022-08-29 NOTE — Telephone Encounter (Signed)
Patient called stating he is needing refills for his   carbamazepine (TEGRETOL XR) 200 MG 12 hr tablet Sent to [facility].

## 2022-08-30 MED ORDER — CARBAMAZEPINE ER 200 MG PO TB12: ORAL_TABLET | ORAL | 0 refills | Status: DC

## 2022-08-30 NOTE — Addendum Note (Signed)
Addended by: Kathryne Sharper T on: 08/30/2022 08:53 AM   Modules accepted: Orders

## 2022-08-30 NOTE — Telephone Encounter (Signed)
I sent to his pharmacy.  He is taking a moderate dose of Tegretol and the last level in the epic was in 2017.  May consider a new level.

## 2022-09-04 ENCOUNTER — Telehealth (HOSPITAL_COMMUNITY): Payer: Self-pay | Admitting: *Deleted

## 2022-09-04 NOTE — Telephone Encounter (Signed)
Opened in Error.

## 2022-09-26 ENCOUNTER — Telehealth (HOSPITAL_COMMUNITY): Payer: Self-pay | Admitting: *Deleted

## 2022-09-26 NOTE — Telephone Encounter (Signed)
Patient called stating he is about to be out of his Tegretol XR and would like it sent to [facility]

## 2022-09-27 ENCOUNTER — Other Ambulatory Visit (HOSPITAL_COMMUNITY): Payer: Self-pay | Admitting: Psychiatry

## 2022-09-27 MED ORDER — CARBAMAZEPINE ER 200 MG PO TB12: ORAL_TABLET | ORAL | 2 refills | Status: DC

## 2022-09-27 NOTE — Telephone Encounter (Signed)
sent 

## 2022-09-27 NOTE — Telephone Encounter (Signed)
Informed patient and he verbalized understanding

## 2022-10-21 ENCOUNTER — Telehealth (INDEPENDENT_AMBULATORY_CARE_PROVIDER_SITE_OTHER): Payer: Medicare Other | Admitting: Psychiatry

## 2022-10-21 ENCOUNTER — Encounter (HOSPITAL_COMMUNITY): Payer: Self-pay | Admitting: Psychiatry

## 2022-10-21 DIAGNOSIS — F319 Bipolar disorder, unspecified: Secondary | ICD-10-CM

## 2022-10-21 MED ORDER — LURASIDONE HCL 120 MG PO TABS: 120.0000 mg | ORAL_TABLET | Freq: Every day | ORAL | 2 refills | Status: DC

## 2022-10-21 MED ORDER — BELSOMRA 20 MG PO TABS: 20.0000 mg | ORAL_TABLET | Freq: Every day | ORAL | 2 refills | Status: DC

## 2022-10-21 MED ORDER — BUPROPION HCL ER (SR) 150 MG PO TB12: 150.0000 mg | ORAL_TABLET | Freq: Three times a day (TID) | ORAL | 2 refills | Status: DC

## 2022-10-21 MED ORDER — BUSPIRONE HCL 30 MG PO TABS: ORAL_TABLET | ORAL | 2 refills | Status: DC

## 2022-10-21 MED ORDER — CARBAMAZEPINE ER 200 MG PO TB12: ORAL_TABLET | ORAL | 2 refills | Status: DC

## 2022-10-21 MED ORDER — ALPRAZOLAM 2 MG PO TABS: ORAL_TABLET | ORAL | 2 refills | Status: DC

## 2022-10-21 NOTE — Progress Notes (Signed)
Virtual Visit via Video Note  I connected with Edward Lutz on 10/21/22 at  9:00 AM EST by a video enabled telemedicine application and verified that I am speaking with the correct person using two identifiers.  Location: Patient: home Provider: office   I discussed the limitations of evaluation and management by telemedicine and the availability of in person appointments. The patient expressed understanding and agreed to proceed.      I discussed the assessment and treatment plan with the patient. The patient was provided an opportunity to ask questions and all were answered. The patient agreed with the plan and demonstrated an understanding of the instructions.   The patient was advised to call back or seek an in-person evaluation if the symptoms worsen or if the condition fails to improve as anticipated.  I provided 15 minutes of non-face-to-face time during this encounter.   [name], MD  [facility] MD/PA/NP OP Progress Note  10/21/2022 9:25 AM Edward Lutz  MRN:  [id number]  Chief Complaint:  Chief Complaint  Patient presents with   Anxiety   Depression   Manic Behavior   Follow-up   HPI: This patient is a41 year old married white male who lives with his wife, a daughter 3years old and a boy 35 years old in [place].  He and his wife  have custody of their 12 and 28-year-old nephew.s he is on disability   The patient returns after 3 months regarding his bipolar disorder.  He states that overall he is doing okay.  He still has some mood swings but they are not as severe as before he was taking medication.  He denies significant depression or thoughts of self-harm or suicide.  He is sleeping fairly well although he wakes up several times through the night.  He and his wife are working on adopting their 43-year-old nephew right now.  He does think his medications are keeping his mood stable and the Xanax continues to help his anxiety.  He is on pain medication but he does not  take it with the Xanax. Visit Diagnosis:  Bipolar 1 disorder   Past Psychiatric History: Past admission in 2009 for suicide attempt  Past Medical History:  Past Medical History:  Diagnosis Date   Back pain    Bipolar disorder (HCC)    HTN (hypertension)    OSA (obstructive sleep apnea)     Past Surgical History:  Procedure Laterality Date   RHINOPLASTY     TONSILLECTOMY      Family Psychiatric History: See below  Family History:  Family History  Problem Relation Age of Onset   Bipolar disorder Mother    Drug abuse Mother    Anxiety disorder Mother    Paranoid behavior Mother    Physical abuse Mother    Bipolar disorder Father    Alcohol abuse Father    Drug abuse Father    Anxiety disorder Father    Paranoid behavior Father    Physical abuse Father    Anxiety disorder Sister    Depression Sister    ADD / ADHD Daughter    Dementia Neg Hx    OCD Neg Hx    Schizophrenia Neg Hx    Seizures Neg Hx    Sexual abuse Neg Hx     Social History:  Social History   Socioeconomic History   Marital status: Married    Spouse name: Not on file   Number of children: Not on file   Years of education:  Not on file   Highest education level: Not on file  Occupational History   Not on file  Tobacco Use   Smoking status: Every Day    Packs/day: 1.00    Years: 15.00    Total pack years: 15.00    Types: Cigarettes   Smokeless tobacco: Never   Tobacco comments:    16-20 cigarettes a day as of 06/08/2013  Substance and Sexual Activity   Alcohol use: Yes    Alcohol/week: 36.0 standard drinks of alcohol    Types: 36 Cans of beer per week    Comment: Reports drinks 5-6 beers a day.    Drug use: No   Sexual activity: Yes    Partners: Female    Birth control/protection: None  Other Topics Concern   Not on file  Social History Narrative   Not on file   Social Determinants of Health   Financial Resource Strain: Not on file  Food Insecurity: Not on file  Transportation  Needs: Not on file  Physical Activity: Not on file  Stress: Not on file  Social Connections: Not on file    Allergies:  Allergies  Allergen Reactions   Penicillins Rash    Has patient had a PCN reaction causing immediate rash, facial/tongue/throat swelling, SOB or lightheadedness with hypotension: Yes Has patient had a PCN reaction causing severe rash involving mucus membranes or skin necrosis: No Has patient had a PCN reaction that required hospitalization No Has patient had a PCN reaction occurring within the last 10 years: No If all of the above answers are "NO", then may proceed with Cephalosporin use.     Metabolic Disorder Labs: Lab Results  Component Value Date   HGBA1C 5.6 12/30/2012   MPG 114 12/30/2012   No results found for: "PROLACTIN" No results found for: "CHOL", "TRIG", "HDL", "CHOLHDL", "VLDL", "LDLCALC" No results found for: "TSH"  Therapeutic Level Labs: Lab Results  Component Value Date   LITHIUM 1.05 01/21/2012   LITHIUM 0.97 10/24/2011   Lab Results  Component Value Date   VALPROATE 60.5 06/16/2008   Lab Results  Component Value Date   CBMZ 11.2 11/20/2016   CBMZ 9.1 08/03/2015    Current Medications: Current Outpatient Medications  Medication Sig Dispense Refill   albuterol (VENTOLIN HFA) 108 (90 Base) MCG/ACT inhaler Inhale 1-2 puffs into the lungs every 6 (six) hours as needed for wheezing or shortness of breath. 18 g 0   alprazolam (XANAX) 2 MG tablet TAKE ONE TABLET BY MOUTH 3 TIMES A DAY. 270 tablet 2   amLODipine (NORVASC) 10 MG tablet Take 5 mg by mouth at bedtime.      benzonatate (TESSALON) 100 MG capsule Take 1-2 capsules (100-200 mg total) by mouth 3 (three) times daily as needed for cough. 60 capsule 0   buPROPion (WELLBUTRIN SR) 150 MG 12 hr tablet Take 1 tablet (150 mg total) by mouth 3 (three) times daily. 90 tablet 2   busPIRone (BUSPAR) 30 MG tablet TAKE (1) TABLET BY MOUTH (3) TIMES DAILY. 90 tablet 2   carbamazepine  (TEGRETOL XR) 200 MG 12 hr tablet TAKE 8 TABLETS BY MOUTH AT BEDTIME. 240 tablet 2   clindamycin (CLEOCIN T) 1 % SWAB      levETIRAcetam (KEPPRA) 500 MG tablet Take 1,000 mg by mouth 2 (two) times daily.      lisinopril-hydrochlorothiazide (PRINZIDE,ZESTORETIC) 20-12.5 MG tablet Take 1 tablet by mouth daily.      Lurasidone HCl 120 MG TABS Take 1 tablet (  120 mg total) by mouth daily with supper. 90 tablet 2   minocycline (MINOCIN,DYNACIN) 100 MG capsule Take 100 mg by mouth daily.      oxyCODONE-acetaminophen (PERCOCET) 10-325 MG tablet Take 1 tablet by mouth every 6 (six) hours as needed.     phentermine (ADIPEX-P) 37.5 MG tablet Take 37.5 mg by mouth daily.     promethazine-dextromethorphan (PROMETHAZINE-DM) 6.25-15 MG/5ML syrup Take 5 mLs by mouth at bedtime as needed for cough. 100 mL 0   rosuvastatin (CRESTOR) 10 MG tablet Take 10 mg by mouth at bedtime.     Semaglutide,0.25 or 0.5MG /DOS, (OZEMPIC, 0.25 OR 0.5 MG/DOSE,) 2 MG/1.5ML SOPN Ozempic  daily     Suvorexant (BELSOMRA) 20 MG TABS Take 20 mg by mouth at bedtime. 30 tablet 2   tamsulosin (FLOMAX) 0.4 MG CAPS capsule Take 0.4 mg by mouth daily.     tiZANidine (ZANAFLEX) 4 MG tablet Take 4 mg by mouth 3 (three) times daily.     tretinoin (RETIN-A) 0.05 % cream Apply 1 application topically 2 (two) times daily.     No current facility-administered medications for this visit.     Musculoskeletal: Strength & Muscle Tone: within normal limits Gait & Station: normal Patient leans: N/A  Psychiatric Specialty Exam: Review of Systems  Musculoskeletal:  Positive for back pain.  All other systems reviewed and are negative.   There were no vitals taken for this visit.There is no height or weight on file to calculate BMI.  General Appearance: Casual and Fairly Groomed  Eye Contact:  Good  Speech:  Clear and Coherent  Volume:  Normal  Mood:  Euthymic  Affect:  Congruent  Thought Process:  Goal Directed  Orientation:  Full (Time,  Place, and Person)  Thought Content: WDL   Suicidal Thoughts:  No  Homicidal Thoughts:  No  Memory:  Immediate;   Good Recent;   Good Remote;   Good  Judgement:  Good  Insight:  Fair  Psychomotor Activity:  Normal  Concentration:  Concentration: Good and Attention Span: Good  Recall:  Good  Fund of Knowledge: Good  Language: Good  Akathisia:  No  Handed:  Right  AIMS (if indicated): not done  Assets:  Communication Skills Desire for Improvement Resilience Social Support Talents/Skills  ADL's:  Intact  Cognition: WNL  Sleep:  Good   Screenings: PHQ2-9    Flowsheet Row [facility] Video Visit from 05/01/2022 in [facility] Video Visit from 02/01/2022 in [facility] Video Visit from 11/07/2021 in [facility] Video Visit from 08/08/2021 in [facility]  PHQ-2 Total Score 1 1 1 1  0      Flowsheet Row Video Visit from 07/23/2022 [facility] Video Visit from 05/01/2022 in [facility] Video Visit from 02/01/2022 in [facility]  C-SSRS RISK CATEGORY No Risk No Risk No Risk        Assessment and Plan: This patient is a 41 year old male with a history of bipolar disorder depression and obsessional symptoms.  He continues to do well on his current regimen.  He will continue Wellbutrin SR 450 mg daily for depression, Tegretol 200 mg - 8 tablets at bedtime for mood stabilization, lurasidone 120 mg daily for mood stabilization, Belsomra 20 mg at bedtime for sleep and Xanax 2 mg 3 times daily for anxiety as well as  BuSpar 30 mg 3 times daily for anxiety.  He will return to see me in 3 months  Collaboration of Care: Collaboration  of Care: Primary Care Provider AEB notes will be shared with PCP at patient's request  Patient/Guardian was advised Release of Information must be obtained prior to any record release in order to collaborate their care with an outside provider. Patient/Guardian was advised if they have not already done so to contact the registration department to sign all necessary forms in order for [place] to release information regarding their care.   Consent: Patient/Guardian gives verbal consent for treatment and assignment of benefits for services provided during this visit. Patient/Guardian expressed understanding and agreed to proceed.    [name], MD 10/21/2022, 9:25 AM

## 2022-11-11 DIAGNOSIS — I10 Essential (primary) hypertension: Secondary | ICD-10-CM | POA: Diagnosis not present

## 2022-11-11 DIAGNOSIS — G8929 Other chronic pain: Secondary | ICD-10-CM | POA: Diagnosis not present

## 2022-11-13 DIAGNOSIS — G603 Idiopathic progressive neuropathy: Secondary | ICD-10-CM | POA: Diagnosis not present

## 2022-11-13 DIAGNOSIS — I10 Essential (primary) hypertension: Secondary | ICD-10-CM | POA: Diagnosis not present

## 2022-11-13 DIAGNOSIS — M545 Low back pain, unspecified: Secondary | ICD-10-CM | POA: Diagnosis not present

## 2022-11-13 DIAGNOSIS — Z79891 Long term (current) use of opiate analgesic: Secondary | ICD-10-CM | POA: Diagnosis not present

## 2022-11-13 DIAGNOSIS — R42 Dizziness and giddiness: Secondary | ICD-10-CM | POA: Diagnosis not present

## 2022-11-13 DIAGNOSIS — R569 Unspecified convulsions: Secondary | ICD-10-CM | POA: Diagnosis not present

## 2022-12-03 DIAGNOSIS — M546 Pain in thoracic spine: Secondary | ICD-10-CM | POA: Diagnosis not present

## 2022-12-03 DIAGNOSIS — R5383 Other fatigue: Secondary | ICD-10-CM | POA: Diagnosis not present

## 2022-12-03 DIAGNOSIS — M545 Low back pain, unspecified: Secondary | ICD-10-CM | POA: Diagnosis not present

## 2022-12-03 DIAGNOSIS — I10 Essential (primary) hypertension: Secondary | ICD-10-CM | POA: Diagnosis not present

## 2022-12-03 DIAGNOSIS — Z79899 Other long term (current) drug therapy: Secondary | ICD-10-CM | POA: Diagnosis not present

## 2022-12-03 DIAGNOSIS — G629 Polyneuropathy, unspecified: Secondary | ICD-10-CM | POA: Diagnosis not present

## 2022-12-03 DIAGNOSIS — E559 Vitamin D deficiency, unspecified: Secondary | ICD-10-CM | POA: Diagnosis not present

## 2022-12-03 DIAGNOSIS — M62838 Other muscle spasm: Secondary | ICD-10-CM | POA: Diagnosis not present

## 2022-12-03 DIAGNOSIS — M129 Arthropathy, unspecified: Secondary | ICD-10-CM | POA: Diagnosis not present

## 2022-12-03 DIAGNOSIS — Z1159 Encounter for screening for other viral diseases: Secondary | ICD-10-CM | POA: Diagnosis not present

## 2022-12-10 DIAGNOSIS — I10 Essential (primary) hypertension: Secondary | ICD-10-CM | POA: Diagnosis not present

## 2022-12-10 DIAGNOSIS — M545 Low back pain, unspecified: Secondary | ICD-10-CM | POA: Diagnosis not present

## 2022-12-10 DIAGNOSIS — M62838 Other muscle spasm: Secondary | ICD-10-CM | POA: Diagnosis not present

## 2022-12-10 DIAGNOSIS — Z79899 Other long term (current) drug therapy: Secondary | ICD-10-CM | POA: Diagnosis not present

## 2022-12-13 DIAGNOSIS — Z79899 Other long term (current) drug therapy: Secondary | ICD-10-CM | POA: Diagnosis not present

## 2023-01-21 ENCOUNTER — Telehealth (INDEPENDENT_AMBULATORY_CARE_PROVIDER_SITE_OTHER): Payer: Medicare Other | Admitting: Psychiatry

## 2023-01-21 ENCOUNTER — Encounter (HOSPITAL_COMMUNITY): Payer: Self-pay | Admitting: Psychiatry

## 2023-01-21 DIAGNOSIS — F319 Bipolar disorder, unspecified: Secondary | ICD-10-CM

## 2023-01-21 MED ORDER — LURASIDONE HCL 120 MG PO TABS
120.0000 mg | ORAL_TABLET | Freq: Every day | ORAL | 2 refills | Status: DC
Start: 2023-01-21 — End: 2023-04-22

## 2023-01-21 MED ORDER — BUSPIRONE HCL 30 MG PO TABS: ORAL_TABLET | ORAL | 2 refills | Status: DC

## 2023-01-21 MED ORDER — BUPROPION HCL ER (SR) 150 MG PO TB12: 150.0000 mg | ORAL_TABLET | Freq: Three times a day (TID) | ORAL | 2 refills | Status: DC

## 2023-01-21 MED ORDER — CARBAMAZEPINE ER 200 MG PO TB12: ORAL_TABLET | ORAL | 2 refills | Status: DC

## 2023-01-21 MED ORDER — BELSOMRA 20 MG PO TABS: 20.0000 mg | ORAL_TABLET | Freq: Every day | ORAL | 2 refills | Status: DC

## 2023-01-21 MED ORDER — ALPRAZOLAM 2 MG PO TABS: ORAL_TABLET | ORAL | 2 refills | Status: DC

## 2023-01-21 NOTE — Progress Notes (Signed)
Virtual Visit via Video Note  I connected with Edward Lutz on 01/21/23 at  9:00 AM EST by a video enabled telemedicine application and verified that I am speaking with the correct person using two identifiers.  Location: Patient: home Provider: office   I discussed the limitations of evaluation and management by telemedicine and the availability of in person appointments. The patient expressed understanding and agreed to proceed.    I discussed the assessment and treatment plan with the patient. The patient was provided an opportunity to ask questions and all were answered. The patient agreed with the plan and demonstrated an understanding of the instructions.   The patient was advised to call back or seek an in-person evaluation if the symptoms worsen or if the condition fails to improve as anticipated.  I provided 15 minutes of non-face-to-face time during this encounter.   Levonne Spiller, MD  [facility] MD/PA/NP OP Progress Note  01/21/2023 9:20 AM Edward Lutz  MRN:  [id number]  Chief Complaint:  Chief Complaint  Patient presents with   Depression   Anxiety   Manic Behavior   Follow-up   HPI:  This patient is a42 year old married white male who lives with his wife, a daughter 2years old and a boy 58 years old in [place].  He and his wife  have custody of their 85 and 24-year-old nephew.s he is on disability    The patient returns after 3 months regarding his bipolar disorder and anxiety.  He states overall his mood has been stable.  His anxiety is generally fairly controlled with the Xanax.  He did have trouble switching to a new pain clinic and he went about a week without pain medicine.  However now he has got this reinstated.  He knows not to combine the oxycodone with Xanax.  He is still working on trying to adopt his 67-year-old nephew which has been somewhat difficult.  Overall however he thinks things in his life are stable and he denies any thoughts of self-harm or  suicide.  Most of the time he is sleeping well and an for him this means about 5 hours a night Visit Diagnosis:    ICD-10-CM   1. Bipolar 1 disorder ([place])  F31.9       Past Psychiatric History: Past admission in 2009 for suicide attempt  Past Medical History:  Past Medical History:  Diagnosis Date   Back pain    Bipolar disorder ([place])    HTN (hypertension)    OSA (obstructive sleep apnea)     Past Surgical History:  Procedure Laterality Date   RHINOPLASTY     TONSILLECTOMY      Family Psychiatric History: See below  Family History:  Family History  Problem Relation Age of Onset   Bipolar disorder Mother    Drug abuse Mother    Anxiety disorder Mother    Paranoid behavior Mother    Physical abuse Mother    Bipolar disorder Father    Alcohol abuse Father    Drug abuse Father    Anxiety disorder Father    Paranoid behavior Father    Physical abuse Father    Anxiety disorder Sister    Depression Sister    ADD / ADHD Daughter    Dementia Neg Hx    OCD Neg Hx    Schizophrenia Neg Hx    Seizures Neg Hx    Sexual abuse Neg Hx     Social History:  Social History   Socioeconomic  History   Marital status: Married    Spouse name: Not on file   Number of children: Not on file   Years of education: Not on file   Highest education level: Not on file  Occupational History   Not on file  Tobacco Use   Smoking status: Every Day    Packs/day: 1.00    Years: 15.00    Total pack years: 15.00    Types: Cigarettes   Smokeless tobacco: Never   Tobacco comments:    16-20 cigarettes a day as of 06/08/2013  Substance and Sexual Activity   Alcohol use: Yes    Alcohol/week: 36.0 standard drinks of alcohol    Types: 36 Cans of beer per week    Comment: Reports drinks 5-6 beers a day.    Drug use: No   Sexual activity: Yes    Partners: Female    Birth control/protection: None  Other Topics Concern   Not on file  Social History Narrative   Not on file   Social  Determinants of Health   Financial Resource Strain: Not on file  Food Insecurity: Not on file  Transportation Needs: Not on file  Physical Activity: Not on file  Stress: Not on file  Social Connections: Not on file    Allergies:  Allergies  Allergen Reactions   Penicillins Rash    Has patient had a PCN reaction causing immediate rash, facial/tongue/throat swelling, SOB or lightheadedness with hypotension: Yes Has patient had a PCN reaction causing severe rash involving mucus membranes or skin necrosis: No Has patient had a PCN reaction that required hospitalization No Has patient had a PCN reaction occurring within the last 10 years: No If all of the above answers are "NO", then may proceed with Cephalosporin use.     Metabolic Disorder Labs: Lab Results  Component Value Date   HGBA1C 5.6 12/30/2012   MPG 114 12/30/2012   No results found for: "PROLACTIN" No results found for: "CHOL", "TRIG", "HDL", "CHOLHDL", "VLDL", "LDLCALC" No results found for: "TSH"  Therapeutic Level Labs: Lab Results  Component Value Date   LITHIUM 1.05 01/21/2012   LITHIUM 0.97 10/24/2011   Lab Results  Component Value Date   VALPROATE 60.5 06/16/2008   Lab Results  Component Value Date   CBMZ 11.2 11/20/2016   CBMZ 9.1 08/03/2015    Current Medications: Current Outpatient Medications  Medication Sig Dispense Refill   albuterol (VENTOLIN HFA) 108 (90 Base) MCG/ACT inhaler Inhale 1-2 puffs into the lungs every 6 (six) hours as needed for wheezing or shortness of breath. 18 g 0   alprazolam (XANAX) 2 MG tablet TAKE ONE TABLET BY MOUTH 3 TIMES A DAY. 270 tablet 2   amLODipine (NORVASC) 10 MG tablet Take 5 mg by mouth at bedtime.      benzonatate (TESSALON) 100 MG capsule Take 1-2 capsules (100-200 mg total) by mouth 3 (three) times daily as needed for cough. 60 capsule 0   buPROPion (WELLBUTRIN SR) 150 MG 12 hr tablet Take 1 tablet (150 mg total) by mouth 3 (three) times daily. 90 tablet 2    busPIRone (BUSPAR) 30 MG tablet TAKE (1) TABLET BY MOUTH (3) TIMES DAILY. 90 tablet 2   carbamazepine (TEGRETOL XR) 200 MG 12 hr tablet TAKE 8 TABLETS BY MOUTH AT BEDTIME. 240 tablet 2   clindamycin (CLEOCIN T) 1 % SWAB      levETIRAcetam (KEPPRA) 500 MG tablet Take 1,000 mg by mouth 2 (two) times daily.  lisinopril-hydrochlorothiazide (PRINZIDE,ZESTORETIC) 20-12.5 MG tablet Take 1 tablet by mouth daily.      Lurasidone HCl 120 MG TABS Take 1 tablet (120 mg total) by mouth daily with supper. 90 tablet 2   minocycline (MINOCIN,DYNACIN) 100 MG capsule Take 100 mg by mouth daily.      oxyCODONE-acetaminophen (PERCOCET) 10-325 MG tablet Take 1 tablet by mouth every 6 (six) hours as needed.     phentermine (ADIPEX-P) 37.5 MG tablet Take 37.5 mg by mouth daily.     promethazine-dextromethorphan (PROMETHAZINE-DM) 6.25-15 MG/5ML syrup Take 5 mLs by mouth at bedtime as needed for cough. 100 mL 0   rosuvastatin (CRESTOR) 10 MG tablet Take 10 mg by mouth at bedtime.     Suvorexant (BELSOMRA) 20 MG TABS Take 1 tablet (20 mg total) by mouth at bedtime. 30 tablet 2   tamsulosin (FLOMAX) 0.4 MG CAPS capsule Take 0.4 mg by mouth daily.     tiZANidine (ZANAFLEX) 4 MG tablet Take 4 mg by mouth 3 (three) times daily.     tretinoin (RETIN-A) 0.05 % cream Apply 1 application topically 2 (two) times daily.     No current facility-administered medications for this visit.     Musculoskeletal: Strength & Muscle Tone: within normal limits Gait & Station: normal Patient leans: N/A  Psychiatric Specialty Exam: Review of Systems  Musculoskeletal:  Positive for back pain.  Psychiatric/Behavioral:  The patient is nervous/anxious.   All other systems reviewed and are negative.   There were no vitals taken for this visit.There is no height or weight on file to calculate BMI.  General Appearance: Casual and Fairly Groomed  Eye Contact:  Good  Speech:  Clear and Coherent  Volume:  Normal  Mood:  Euthymic   Affect:  Congruent  Thought Process:  Goal Directed  Orientation:  Full (Time, Place, and Person)  Thought Content: Rumination   Suicidal Thoughts:  No  Homicidal Thoughts:  No  Memory:  Immediate;   Good Recent;   Good Remote;   NA  Judgement:  Good  Insight:  Fair  Psychomotor Activity:  Normal  Concentration:  Concentration: Good and Attention Span: Good  Recall:  Good  Fund of Knowledge: Good  Language: Good  Akathisia:  No  Handed:  Right  AIMS (if indicated): not done  Assets:  Communication Skills Desire for Improvement Resilience Social Support Talents/Skills  ADL's:  Intact  Cognition: WNL  Sleep:  Fair   Screenings: PHQ2-9    Flowsheet Row Video Visit from 07/23/2022 in [place] [facility] [place] Video Visit from 05/01/2022 in [place] at [place] Video Visit from 02/01/2022 in [place] at [place] Video Visit from 11/07/2021 in [place] at [place] Video Visit from 08/08/2021 in [place] at [facility] Total Score 1 1 1 1  0      Flowsheet Row Video Visit from 07/23/2022 in  at [place] Video Visit from 05/01/2022 in [place] at [place] Video Visit from 02/01/2022 in [place] at [place] No Risk No Risk No Risk        Assessment and Plan: This patient is a 42 year old male with a history of bipolar disorder depression and obsessional symptoms.  He is doing well on his current regimen.  He will continue Wellbutrin SR 450 mg daily for depression, Tegretol 200 mg - 8  tablets at bedtime for mood stabilization, lurasidone 120 mg daily for mood stabilization, Belsomra 20 mg at bedtime for sleep, Xanax 2 mg 3 times daily as well as BuSpar 30 mg 3 times daily for anxiety.  He will  return to see me in 3 months  Collaboration of Care: Collaboration of Care: Primary Care Provider AEB notes will be shared with PCP at patient's request  Patient/Guardian was advised Release of Information must be obtained prior to any record release in order to collaborate their care with an outside provider. Patient/Guardian was advised if they have not already done so to contact the registration department to sign all necessary forms in order for [place] to release information regarding their care.   Consent: Patient/Guardian gives verbal consent for treatment and assignment of benefits for services provided during this visit. Patient/Guardian expressed understanding and agreed to proceed.    Levonne Spiller, MD 01/21/2023, 9:20 AM

## 2023-02-06 DIAGNOSIS — Z0000 Encounter for general adult medical examination without abnormal findings: Secondary | ICD-10-CM | POA: Diagnosis not present

## 2023-02-06 DIAGNOSIS — M62838 Other muscle spasm: Secondary | ICD-10-CM | POA: Diagnosis not present

## 2023-02-06 DIAGNOSIS — M545 Low back pain, unspecified: Secondary | ICD-10-CM | POA: Diagnosis not present

## 2023-02-06 DIAGNOSIS — Z79899 Other long term (current) drug therapy: Secondary | ICD-10-CM | POA: Diagnosis not present

## 2023-02-06 DIAGNOSIS — R030 Elevated blood-pressure reading, without diagnosis of hypertension: Secondary | ICD-10-CM | POA: Diagnosis not present

## 2023-02-10 DIAGNOSIS — Z79899 Other long term (current) drug therapy: Secondary | ICD-10-CM | POA: Diagnosis not present

## 2023-03-05 DIAGNOSIS — M62838 Other muscle spasm: Secondary | ICD-10-CM | POA: Diagnosis not present

## 2023-03-05 DIAGNOSIS — Z79899 Other long term (current) drug therapy: Secondary | ICD-10-CM | POA: Diagnosis not present

## 2023-03-05 DIAGNOSIS — M545 Low back pain, unspecified: Secondary | ICD-10-CM | POA: Diagnosis not present

## 2023-03-05 DIAGNOSIS — R030 Elevated blood-pressure reading, without diagnosis of hypertension: Secondary | ICD-10-CM | POA: Diagnosis not present

## 2023-03-07 DIAGNOSIS — Z79899 Other long term (current) drug therapy: Secondary | ICD-10-CM | POA: Diagnosis not present

## 2023-03-31 DIAGNOSIS — M62838 Other muscle spasm: Secondary | ICD-10-CM | POA: Diagnosis not present

## 2023-03-31 DIAGNOSIS — M545 Low back pain, unspecified: Secondary | ICD-10-CM | POA: Diagnosis not present

## 2023-03-31 DIAGNOSIS — R030 Elevated blood-pressure reading, without diagnosis of hypertension: Secondary | ICD-10-CM | POA: Diagnosis not present

## 2023-03-31 DIAGNOSIS — Z79899 Other long term (current) drug therapy: Secondary | ICD-10-CM | POA: Diagnosis not present

## 2023-04-01 ENCOUNTER — Other Ambulatory Visit (HOSPITAL_COMMUNITY): Payer: Self-pay | Admitting: Psychiatry

## 2023-04-03 DIAGNOSIS — Z79899 Other long term (current) drug therapy: Secondary | ICD-10-CM | POA: Diagnosis not present

## 2023-04-22 ENCOUNTER — Encounter (HOSPITAL_COMMUNITY): Payer: Self-pay | Admitting: Psychiatry

## 2023-04-22 ENCOUNTER — Telehealth (HOSPITAL_COMMUNITY): Payer: Medicare Other | Admitting: Psychiatry

## 2023-04-22 DIAGNOSIS — F319 Bipolar disorder, unspecified: Secondary | ICD-10-CM

## 2023-04-22 MED ORDER — BUPROPION HCL ER (SR) 150 MG PO TB12: 150.0000 mg | ORAL_TABLET | Freq: Three times a day (TID) | ORAL | 2 refills | Status: DC

## 2023-04-22 MED ORDER — BUSPIRONE HCL 30 MG PO TABS: ORAL_TABLET | ORAL | 2 refills | Status: DC

## 2023-04-22 MED ORDER — CARBAMAZEPINE ER 200 MG PO TB12: ORAL_TABLET | ORAL | 2 refills | Status: DC

## 2023-04-22 MED ORDER — LURASIDONE HCL 120 MG PO TABS: 120.0000 mg | ORAL_TABLET | Freq: Every day | ORAL | 2 refills | Status: DC

## 2023-04-22 MED ORDER — ALPRAZOLAM 2 MG PO TABS: ORAL_TABLET | ORAL | 2 refills | Status: DC

## 2023-04-22 MED ORDER — BELSOMRA 20 MG PO TABS: 20.0000 mg | ORAL_TABLET | Freq: Every day | ORAL | 2 refills | Status: DC

## 2023-04-22 NOTE — Progress Notes (Signed)
Virtual Visit via Video Note  I connected with Edward Lutz on 04/22/23 at 10:20 AM EDT by a video enabled telemedicine application and verified that I am speaking with the correct person using two identifiers.  Location: Patient: home Provider: office   I discussed the limitations of evaluation and management by telemedicine and the availability of in person appointments. The patient expressed understanding and agreed to proceed.     I discussed the assessment and treatment plan with the patient. The patient was provided an opportunity to ask questions and all were answered. The patient agreed with the plan and demonstrated an understanding of the instructions.   The patient was advised to call back or seek an in-person evaluation if the symptoms worsen or if the condition fails to improve as anticipated.  I provided 15 minutes of non-face-to-face time during this encounter.   [name], MD  [facility] MD/PA/NP OP Progress Note  04/22/2023 10:37 AM Edward Lutz  MRN:  [id number]  Chief Complaint:  Chief Complaint  Patient presents with   Anxiety   Depression   Manic Behavior   Follow-up   HPI: This patient is a42 year old married white male who lives with his wife, a daughter 70years old and a boy 6 years old in [place].  He and his wife  have custody of their 38 and 59-year-old nephew.s He is on disability   The patient returns after 3 months regarding his bipolar disorder and anxiety.  Overall he continues to do well.  He states that he and his wife got final custody of the 26-year-old nephew.  He states that his mood has been stable and he denies significant depression although sometimes his mood is a bit up-and-down.  He denies significant anxiety.  He sleeps about 5 hours a night and seems to be able to function okay with this.  He is on a lot of medication for anxiety and sleep and we really cannot go up any further.  He denies any thoughts of self-harm or suicide  Visit  Diagnosis:    ICD-10-CM   1. Bipolar 1 disorder (HCC)  F31.9       Past Psychiatric History: Past admission in 2009 for suicidal attempt  Past Medical History:  Past Medical History:  Diagnosis Date   Back pain    Bipolar disorder (HCC)    HTN (hypertension)    OSA (obstructive sleep apnea)     Past Surgical History:  Procedure Laterality Date   RHINOPLASTY     TONSILLECTOMY      Family Psychiatric History: See below  Family History:  Family History  Problem Relation Age of Onset   Bipolar disorder Mother    Drug abuse Mother    Anxiety disorder Mother    Paranoid behavior Mother    Physical abuse Mother    Bipolar disorder Father    Alcohol abuse Father    Drug abuse Father    Anxiety disorder Father    Paranoid behavior Father    Physical abuse Father    Anxiety disorder Sister    Depression Sister    ADD / ADHD Daughter    Dementia Neg Hx    OCD Neg Hx    Schizophrenia Neg Hx    Seizures Neg Hx    Sexual abuse Neg Hx     Social History:  Social History   Socioeconomic History   Marital status: Married    Spouse name: Not on file   Number of children:  Not on file   Years of education: Not on file   Highest education level: Not on file  Occupational History   Not on file  Tobacco Use   Smoking status: Every Day    Packs/day: 1.00    Years: 15.00    Additional pack years: 0.00    Total pack years: 15.00    Types: Cigarettes   Smokeless tobacco: Never   Tobacco comments:    16-20 cigarettes a day as of 06/08/2013  Substance and Sexual Activity   Alcohol use: Yes    Alcohol/week: 36.0 standard drinks of alcohol    Types: 36 Cans of beer per week    Comment: Reports drinks 5-6 beers a day.    Drug use: No   Sexual activity: Yes    Partners: Female    Birth control/protection: None  Other Topics Concern   Not on file  Social History Narrative   Not on file   Social Determinants of Health   Financial Resource Strain: Not on file  Food  Insecurity: Not on file  Transportation Needs: Not on file  Physical Activity: Not on file  Stress: Not on file  Social Connections: Not on file    Allergies:  Allergies  Allergen Reactions   Penicillins Rash    Has patient had a PCN reaction causing immediate rash, facial/tongue/throat swelling, SOB or lightheadedness with hypotension: Yes Has patient had a PCN reaction causing severe rash involving mucus membranes or skin necrosis: No Has patient had a PCN reaction that required hospitalization No Has patient had a PCN reaction occurring within the last 10 years: No If all of the above answers are "NO", then may proceed with Cephalosporin use.     Metabolic Disorder Labs: Lab Results  Component Value Date   HGBA1C 5.6 12/30/2012   MPG 114 12/30/2012   No results found for: "PROLACTIN" No results found for: "CHOL", "TRIG", "HDL", "CHOLHDL", "VLDL", "LDLCALC" No results found for: "TSH"  Therapeutic Level Labs: Lab Results  Component Value Date   LITHIUM 1.05 01/21/2012   LITHIUM 0.97 10/24/2011   Lab Results  Component Value Date   VALPROATE 60.5 06/16/2008   Lab Results  Component Value Date   CBMZ 11.2 11/20/2016   CBMZ 9.1 08/03/2015    Current Medications: Current Outpatient Medications  Medication Sig Dispense Refill   albuterol (VENTOLIN HFA) 108 (90 Base) MCG/ACT inhaler Inhale 1-2 puffs into the lungs every 6 (six) hours as needed for wheezing or shortness of breath. 18 g 0   alprazolam (XANAX) 2 MG tablet TAKE ONE TABLET BY MOUTH 3 TIMES A DAY. 270 tablet 2   amLODipine (NORVASC) 10 MG tablet Take 5 mg by mouth at bedtime.      benzonatate (TESSALON) 100 MG capsule Take 1-2 capsules (100-200 mg total) by mouth 3 (three) times daily as needed for cough. 60 capsule 0   buPROPion (WELLBUTRIN SR) 150 MG 12 hr tablet Take 1 tablet (150 mg total) by mouth 3 (three) times daily. 90 tablet 2   busPIRone (BUSPAR) 30 MG tablet TAKE (1) TABLET BY MOUTH (3) TIMES  DAILY. 90 tablet 2   carbamazepine (TEGRETOL XR) 200 MG 12 hr tablet TAKE 8 TABLETS BY MOUTH AT BEDTIME. 240 tablet 2   clindamycin (CLEOCIN T) 1 % SWAB      levETIRAcetam (KEPPRA) 500 MG tablet Take 1,000 mg by mouth 2 (two) times daily.      lisinopril-hydrochlorothiazide (PRINZIDE,ZESTORETIC) 20-12.5 MG tablet Take 1 tablet by  mouth daily.      Lurasidone HCl 120 MG TABS Take 1 tablet (120 mg total) by mouth daily with supper. 90 tablet 2   minocycline (MINOCIN,DYNACIN) 100 MG capsule Take 100 mg by mouth daily.      oxyCODONE-acetaminophen (PERCOCET) 10-325 MG tablet Take 1 tablet by mouth every 6 (six) hours as needed.     phentermine (ADIPEX-P) 37.5 MG tablet Take 37.5 mg by mouth daily.     promethazine-dextromethorphan (PROMETHAZINE-DM) 6.25-15 MG/5ML syrup Take 5 mLs by mouth at bedtime as needed for cough. 100 mL 0   rosuvastatin (CRESTOR) 10 MG tablet Take 10 mg by mouth at bedtime.     Suvorexant (BELSOMRA) 20 MG TABS Take 1 tablet (20 mg total) by mouth at bedtime. 30 tablet 2   tamsulosin (FLOMAX) 0.4 MG CAPS capsule Take 0.4 mg by mouth daily.     tiZANidine (ZANAFLEX) 4 MG tablet Take 4 mg by mouth 3 (three) times daily.     tretinoin (RETIN-A) 0.05 % cream Apply 1 application topically 2 (two) times daily.     No current facility-administered medications for this visit.     Musculoskeletal: Strength & Muscle Tone: within normal limits Gait & Station: normal Patient leans: N/A  Psychiatric Specialty Exam: Review of Systems  Musculoskeletal:  Positive for back pain.  All other systems reviewed and are negative.   There were no vitals taken for this visit.There is no height or weight on file to calculate BMI.  General Appearance: Casual and Fairly Groomed  Eye Contact:  Good  Speech:  Clear and Coherent  Volume:  Normal  Mood:  Euthymic  Affect:  Congruent  Thought Process:  Goal Directed  Orientation:  Full (Time, Place, and Person)  Thought Content: WDL    Suicidal Thoughts:  No  Homicidal Thoughts:  No  Memory:  Immediate;   Good Recent;   Good Remote;   Fair  Judgement:  Good  Insight:  Fair  Psychomotor Activity:  Normal  Concentration:  Concentration: Good and Attention Span: Good  Recall:  Good  Fund of Knowledge: Good  Language: Good  Akathisia:  No  Handed:  Right  AIMS (if indicated): not done  Assets:  Communication Skills Desire for Improvement Physical Health Resilience Social Support  ADL's:  Intact  Cognition: WNL  Sleep:  Fair   Screenings: PHQ2-9    Flowsheet Row Video Visit from 07/23/2022 in [place] at [place] Video Visit from 05/01/2022 in [facility] at [place] Video Visit from 02/01/2022 in [facility] at [place] Video Visit from 11/07/2021 in [facility] at [place] Video Visit from 08/08/2021 in [facility] at [facility] Total Score 1 1 1 1  0      Flowsheet Row Video Visit from 07/23/2022 in [place] [facility] at [place] Video Visit from 05/01/2022 in [facility] at [place] Video Visit from 02/01/2022 in [facility] at [place]  C-SSRS RISK CATEGORY No Risk No Risk No Risk        Assessment and Plan: This patient is a 42 year old male with a history of bipolar disorder depression and obsessional symptoms.  He continues to do well on his current regimen.  He will continue Wellbutrin SR 450 mg daily for depression, Tegretol 200 mg - 8 tablets at bedtime for mood stabilization, lurasidone 120 mg daily for mood stabilization, Belsomra 20  mg at bedtime for sleep.  Xanax 2 mg 3 times daily as well as BuSpar 30 mg 3 times daily for anxiety.  He will return to see me in 3 months  Collaboration of Care: Collaboration of Care: Primary Care Provider AEB notes will be shared  with PCP at patient request patient/Guardian was advised Release of Information must be obtained prior to any record release in order to collaborate their care with an outside provider. Patient/Guardian was advised if they have not already done so to contact the registration department to sign all necessary forms in order for [place] to release information regarding their care.   Consent: Patient/Guardian gives verbal consent for treatment and assignment of benefits for services provided during this visit. Patient/Guardian expressed understanding and agreed to proceed.    [name], MD 04/22/2023, 10:37 AM

## 2023-05-01 DIAGNOSIS — E559 Vitamin D deficiency, unspecified: Secondary | ICD-10-CM | POA: Diagnosis not present

## 2023-05-01 DIAGNOSIS — Z131 Encounter for screening for diabetes mellitus: Secondary | ICD-10-CM | POA: Diagnosis not present

## 2023-05-01 DIAGNOSIS — M545 Low back pain, unspecified: Secondary | ICD-10-CM | POA: Diagnosis not present

## 2023-05-01 DIAGNOSIS — M129 Arthropathy, unspecified: Secondary | ICD-10-CM | POA: Diagnosis not present

## 2023-05-01 DIAGNOSIS — R5383 Other fatigue: Secondary | ICD-10-CM | POA: Diagnosis not present

## 2023-05-01 DIAGNOSIS — R030 Elevated blood-pressure reading, without diagnosis of hypertension: Secondary | ICD-10-CM | POA: Diagnosis not present

## 2023-05-01 DIAGNOSIS — Z79899 Other long term (current) drug therapy: Secondary | ICD-10-CM | POA: Diagnosis not present

## 2023-05-05 DIAGNOSIS — Z79899 Other long term (current) drug therapy: Secondary | ICD-10-CM | POA: Diagnosis not present

## 2023-05-07 DIAGNOSIS — E785 Hyperlipidemia, unspecified: Secondary | ICD-10-CM | POA: Diagnosis not present

## 2023-05-07 DIAGNOSIS — R7301 Impaired fasting glucose: Secondary | ICD-10-CM | POA: Diagnosis not present

## 2023-05-07 DIAGNOSIS — I10 Essential (primary) hypertension: Secondary | ICD-10-CM | POA: Diagnosis not present

## 2023-05-08 DIAGNOSIS — Z0001 Encounter for general adult medical examination with abnormal findings: Secondary | ICD-10-CM | POA: Diagnosis not present

## 2023-05-08 DIAGNOSIS — Z0000 Encounter for general adult medical examination without abnormal findings: Secondary | ICD-10-CM | POA: Diagnosis not present

## 2023-05-13 DIAGNOSIS — G8929 Other chronic pain: Secondary | ICD-10-CM | POA: Diagnosis not present

## 2023-05-13 DIAGNOSIS — L659 Nonscarring hair loss, unspecified: Secondary | ICD-10-CM | POA: Diagnosis not present

## 2023-05-13 DIAGNOSIS — E559 Vitamin D deficiency, unspecified: Secondary | ICD-10-CM | POA: Diagnosis not present

## 2023-05-13 DIAGNOSIS — G629 Polyneuropathy, unspecified: Secondary | ICD-10-CM | POA: Diagnosis not present

## 2023-05-13 DIAGNOSIS — Z0001 Encounter for general adult medical examination with abnormal findings: Secondary | ICD-10-CM | POA: Diagnosis not present

## 2023-05-13 DIAGNOSIS — I10 Essential (primary) hypertension: Secondary | ICD-10-CM | POA: Diagnosis not present

## 2023-05-13 DIAGNOSIS — E785 Hyperlipidemia, unspecified: Secondary | ICD-10-CM | POA: Diagnosis not present

## 2023-05-29 DIAGNOSIS — Z79899 Other long term (current) drug therapy: Secondary | ICD-10-CM | POA: Diagnosis not present

## 2023-05-29 DIAGNOSIS — R030 Elevated blood-pressure reading, without diagnosis of hypertension: Secondary | ICD-10-CM | POA: Diagnosis not present

## 2023-05-29 DIAGNOSIS — M62838 Other muscle spasm: Secondary | ICD-10-CM | POA: Diagnosis not present

## 2023-05-29 DIAGNOSIS — G629 Polyneuropathy, unspecified: Secondary | ICD-10-CM | POA: Diagnosis not present

## 2023-05-29 DIAGNOSIS — M545 Low back pain, unspecified: Secondary | ICD-10-CM | POA: Diagnosis not present

## 2023-06-27 DIAGNOSIS — M62838 Other muscle spasm: Secondary | ICD-10-CM | POA: Diagnosis not present

## 2023-06-27 DIAGNOSIS — Z79899 Other long term (current) drug therapy: Secondary | ICD-10-CM | POA: Diagnosis not present

## 2023-06-27 DIAGNOSIS — M545 Low back pain, unspecified: Secondary | ICD-10-CM | POA: Diagnosis not present

## 2023-06-27 DIAGNOSIS — M546 Pain in thoracic spine: Secondary | ICD-10-CM | POA: Diagnosis not present

## 2023-07-01 DIAGNOSIS — Z79899 Other long term (current) drug therapy: Secondary | ICD-10-CM | POA: Diagnosis not present

## 2023-07-10 ENCOUNTER — Other Ambulatory Visit (HOSPITAL_COMMUNITY): Payer: Self-pay | Admitting: Psychiatry

## 2023-07-23 ENCOUNTER — Encounter (HOSPITAL_COMMUNITY): Payer: Self-pay | Admitting: Psychiatry

## 2023-07-23 ENCOUNTER — Telehealth (HOSPITAL_COMMUNITY): Payer: Medicare Other | Admitting: Psychiatry

## 2023-07-23 DIAGNOSIS — F319 Bipolar disorder, unspecified: Secondary | ICD-10-CM

## 2023-07-23 DIAGNOSIS — F419 Anxiety disorder, unspecified: Secondary | ICD-10-CM

## 2023-07-23 MED ORDER — ALPRAZOLAM 2 MG PO TABS: ORAL_TABLET | ORAL | 2 refills | Status: DC

## 2023-07-23 MED ORDER — BUPROPION HCL ER (SR) 150 MG PO TB12: 150.0000 mg | ORAL_TABLET | Freq: Three times a day (TID) | ORAL | 2 refills | Status: DC

## 2023-07-23 MED ORDER — LURASIDONE HCL 120 MG PO TABS: 120.0000 mg | ORAL_TABLET | Freq: Every day | ORAL | 2 refills | Status: DC

## 2023-07-23 MED ORDER — BELSOMRA 20 MG PO TABS: 20.0000 mg | ORAL_TABLET | Freq: Every day | ORAL | 2 refills | Status: DC

## 2023-07-23 MED ORDER — BUSPIRONE HCL 30 MG PO TABS: ORAL_TABLET | ORAL | 2 refills | Status: DC

## 2023-07-23 MED ORDER — SERTRALINE HCL 50 MG PO TABS: 50.0000 mg | ORAL_TABLET | Freq: Every day | ORAL | 2 refills | Status: DC

## 2023-07-23 NOTE — Progress Notes (Signed)
Virtual Visit via Video Note  I connected with Edward Lutz on 07/23/23 at  9:00 AM EDT by a video enabled telemedicine application and verified that I am speaking with the correct person using two identifiers.  Location: Patient: home Provider: office   I discussed the limitations of evaluation and management by telemedicine and the availability of in person appointments. The patient expressed understanding and agreed to proceed.     I discussed the assessment and treatment plan with the patient. The patient was provided an opportunity to ask questions and all were answered. The patient agreed with the plan and demonstrated an understanding of the instructions.   The patient was advised to call back or seek an in-person evaluation if the symptoms worsen or if the condition fails to improve as anticipated.  I provided 20 minutes of non-face-to-face time during this encounter.   [name], MD  [facility] MD/PA/NP OP Progress Note  07/23/2023 9:29 AM Edward Lutz  MRN:  [id number]  Chief Complaint:  Chief Complaint  Patient presents with   Depression   Anxiety   Manic Behavior   Follow-up   HPI: This patient is a 42 year old married white male who lives with his wife and 3 children.  They recently got custody of his 46 year old nephew as well.  He is on disability for mental illness.  The patient returns for follow-up after 3 months regarding his bipolar disorder and anxiety.  He states over the last few weeks he has not been feeling right.  He is more depressed and is isolating and staying in the house a lot.  He is not going over to a friend's house.  He is not wanting to really go anywhere and dreads going outside.  He cannot think of any new recent stressors.  Unfortunately he still tends to drink beer when he gets upset although he does not think he is accelerated the use.  His sleep is okay.  He denies any thoughts of suicide or self-harm.  He is on high doses of mood  stabilizers as well as Wellbutrin.  However given that he seems more depressed I suggested adding Zoloft and he is in agreement. Visit Diagnosis:    ICD-10-CM   1. Bipolar 1 disorder (HCC)  F31.9       Past Psychiatric History: Admission in 2009 for suicide attempt  Past Medical History:  Past Medical History:  Diagnosis Date   Back pain    Bipolar disorder (HCC)    HTN (hypertension)    OSA (obstructive sleep apnea)     Past Surgical History:  Procedure Laterality Date   RHINOPLASTY     TONSILLECTOMY      Family Psychiatric History: See below  Family History:  Family History  Problem Relation Age of Onset   Bipolar disorder Mother    Drug abuse Mother    Anxiety disorder Mother    Paranoid behavior Mother    Physical abuse Mother    Bipolar disorder Father    Alcohol abuse Father    Drug abuse Father    Anxiety disorder Father    Paranoid behavior Father    Physical abuse Father    Anxiety disorder Sister    Depression Sister    ADD / ADHD Daughter    Dementia Neg Hx    OCD Neg Hx    Schizophrenia Neg Hx    Seizures Neg Hx    Sexual abuse Neg Hx     Social History:  Social History   Socioeconomic History   Marital status: Married    Spouse name: Not on file   Number of children: Not on file   Years of education: Not on file   Highest education level: Not on file  Occupational History   Not on file  Tobacco Use   Smoking status: Every Day    Current packs/day: 1.00    Average packs/day: 1 pack/day for 15.0 years (15.0 ttl pk-yrs)    Types: Cigarettes   Smokeless tobacco: Never   Tobacco comments:    16-20 cigarettes a day as of 06/08/2013  Substance and Sexual Activity   Alcohol use: Yes    Alcohol/week: 36.0 standard drinks of alcohol    Types: 36 Cans of beer per week    Comment: Reports drinks 5-6 beers a day.    Drug use: No   Sexual activity: Yes    Partners: Female    Birth control/protection: None  Other Topics Concern   Not on file   Social History Narrative   Not on file   Social Determinants of Health   Financial Resource Strain: Low Risk  (11/22/2022)   [facility], [facility]   Overall Financial Resource Strain (CARDIA)    Difficulty of Paying Living Expenses: Not hard at all  Food Insecurity: No Food Insecurity (11/22/2022)   Received from [facility], [facility]   Hunger Vital Sign    Worried About Running Out of Food in the Last Year: Never true    Ran Out of Food in the Last Year: Never true  Transportation Needs: No Transportation Needs (09/17/2021)   Received from [employer], [facility]   PRAPARE - Transportation    Lack of Transportation (Medical): No    Lack of Transportation (Non-Medical): No  Physical Activity: Inactive (11/22/2022)   Received from [facility], [facility]   Exercise Vital Sign    Days of Exercise per Week: 0 days    Minutes of Exercise per Session: 0 min  Stress: Stress Concern Present (11/22/2022)   Received from [facility], [facility] of Occupational Health - Occupational Stress Questionnaire    Feeling of Stress : Rather much  Social Connections: Somewhat Isolated (11/22/2022)   [facility] from [facility], [facility]   Social Network    How would you rate your social network (family, work, friends)?: Restricted participation with some degree of social isolation    Allergies:  Allergies  Allergen Reactions   Penicillins Rash    Has patient had a PCN reaction causing immediate rash, facial/tongue/throat swelling, SOB or lightheadedness with hypotension: Yes Has patient had a PCN reaction causing severe rash involving mucus membranes or skin necrosis: No Has patient had a PCN reaction that required hospitalization No Has patient had a PCN reaction occurring within the last 10 years: No If all of the above answers are "NO", then may proceed with Cephalosporin use.     Metabolic Disorder Labs: Lab Results   Component Value Date   HGBA1C 5.6 12/30/2012   MPG 114 12/30/2012   No results found for: "PROLACTIN" No results found for: "CHOL", "TRIG", "HDL", "CHOLHDL", "VLDL", "LDLCALC" No results found for: "TSH"  Therapeutic Level Labs: Lab Results  Component Value Date   LITHIUM 1.05 01/21/2012   LITHIUM 0.97 10/24/2011   Lab Results  Component Value Date   VALPROATE 60.5 06/16/2008   Lab Results  Component Value Date   CBMZ 11.2 11/20/2016  CBMZ 9.1 08/03/2015    Current Medications: Current Outpatient Medications  Medication Sig Dispense Refill   sertraline (ZOLOFT) 50 MG tablet Take 1 tablet (50 mg total) by mouth daily. 30 tablet 2   XTAMPZA ER 27 MG C12A Take 1 capsule by mouth 2 (two) times daily.     albuterol (VENTOLIN HFA) 108 (90 Base) MCG/ACT inhaler Inhale 1-2 puffs into the lungs every 6 (six) hours as needed for wheezing or shortness of breath. 18 g 0   alprazolam (XANAX) 2 MG tablet TAKE ONE TABLET BY MOUTH 3 TIMES A DAY. 270 tablet 2   amLODipine (NORVASC) 10 MG tablet Take 5 mg by mouth at bedtime.      benzonatate (TESSALON) 100 MG capsule Take 1-2 capsules (100-200 mg total) by mouth 3 (three) times daily as needed for cough. 60 capsule 0   buPROPion (WELLBUTRIN SR) 150 MG 12 hr tablet Take 1 tablet (150 mg total) by mouth 3 (three) times daily. 90 tablet 2   busPIRone (BUSPAR) 30 MG tablet TAKE (1) TABLET BY MOUTH (3) TIMES DAILY. 90 tablet 2   carbamazepine (TEGRETOL XR) 200 MG 12 hr tablet TAKE 8 TABLETS BY MOUTH AT BEDTIME. 240 tablet 0   clindamycin (CLEOCIN T) 1 % SWAB      levETIRAcetam (KEPPRA) 500 MG tablet Take 1,000 mg by mouth 2 (two) times daily.      lisinopril-hydrochlorothiazide (PRINZIDE,ZESTORETIC) 20-12.5 MG tablet Take 1 tablet by mouth daily.      Lurasidone HCl 120 MG TABS Take 1 tablet (120 mg total) by mouth daily with supper. 90 tablet 2   minocycline (MINOCIN,DYNACIN) 100 MG capsule Take 100 mg by mouth daily.       oxyCODONE-acetaminophen (PERCOCET) 10-325 MG tablet Take 1 tablet by mouth every 6 (six) hours as needed.     phentermine (ADIPEX-P) 37.5 MG tablet Take 37.5 mg by mouth daily.     promethazine-dextromethorphan (PROMETHAZINE-DM) 6.25-15 MG/5ML syrup Take 5 mLs by mouth at bedtime as needed for cough. 100 mL 0   rosuvastatin (CRESTOR) 10 MG tablet Take 10 mg by mouth at bedtime.     Suvorexant (BELSOMRA) 20 MG TABS Take 1 tablet (20 mg total) by mouth at bedtime. 30 tablet 2   tamsulosin (FLOMAX) 0.4 MG CAPS capsule Take 0.4 mg by mouth daily.     tiZANidine (ZANAFLEX) 4 MG tablet Take 4 mg by mouth 3 (three) times daily.     tretinoin (RETIN-A) 0.05 % cream Apply 1 application topically 2 (two) times daily.     No current facility-administered medications for this visit.     Musculoskeletal: Strength & Muscle Tone: within normal limits Gait & Station: normal Patient leans: N/A  Psychiatric Specialty Exam: Review of Systems  Musculoskeletal:  Positive for back pain.  Psychiatric/Behavioral:  Positive for dysphoric mood. The patient is nervous/anxious.   All other systems reviewed and are negative.   There were no vitals taken for this visit.There is no height or weight on file to calculate BMI.  General Appearance: Casual and Fairly Groomed  Eye Contact:  Fair  Speech:  Clear and Coherent  Volume:  Decreased  Mood:  Dysphoric  Affect:  Flat  Thought Process:  Goal Directed  Orientation:  Full (Time, Place, and Person)  Thought Content: Rumination   Suicidal Thoughts:  No  Homicidal Thoughts:  No  Memory:  Immediate;   Good Recent;   Good Remote;   NA  Judgement:  Fair  Insight:  Fair  Psychomotor Activity:  Decreased  Concentration:  Concentration: Fair and Attention Span: Fair  Recall:  Good  Fund of Knowledge: Good  Language: Good  Akathisia:  No  Handed:  Right  AIMS (if indicated): not done  Assets:  Communication Skills Desire for Improvement Physical  Health Resilience Social Support  ADL's:  Intact  Cognition: WNL  Sleep:  Fair   Screenings: PHQ2-9    Flowsheet Row Video Visit from 07/23/2022 in [place] [facility] at [place] Video Visit from 05/01/2022 in [facility] at [place] Video Visit from 02/01/2022 in [facility] at [place] Video Visit from 11/07/2021 in [facility] at [place] Video Visit from 08/08/2021 in [facility] at [facility] Total Score 1 1 1 1  0      Flowsheet Row Video Visit from 07/23/2022 in [facility] at [place] Video Visit from 05/01/2022 in [facility] at [place] Video Visit from 02/01/2022 in [facility] Outpatient Behavioral Health at [place]  C-SSRS RISK CATEGORY No Risk No Risk No Risk        Assessment and Plan:  This patient is a 42 year old male with a history of bipolar disorder, depression obsessional symptoms and episodic alcohol abuse.  What he is describing now seems more like depression/anxiety so we will add Zoloft 50 mg to his regimen.  He will continue Wellbutrin SR 450 mg daily for depression, Tegretol 200 mg - 8 tablets at bedtime for mood stabilization, lurasidone 120 mg daily for mood stabilization, Xanax 2 mg 3 times daily as well as BuSpar 30 mg 3 times daily for anxiety and Belsomra 20 mg at bedtime for sleep.  He will return to see me in 6 weeks Collaboration of Care: Collaboration of Care: Primary Care Provider AEB notes are shared with PCP on the epic system  Patient/Guardian was advised Release of Information must be obtained prior to any record release in order to collaborate their care with an outside provider. Patient/Guardian was advised if they have not already done so to contact the registration department to sign all necessary forms in order for [place] to release  information regarding their care.   Consent: Patient/Guardian gives verbal consent for treatment and assignment of benefits for services provided during this visit. Patient/Guardian expressed understanding and agreed to proceed.    [name], MD 07/23/2023, 9:29 AM

## 2023-07-25 DIAGNOSIS — M545 Low back pain, unspecified: Secondary | ICD-10-CM | POA: Diagnosis not present

## 2023-07-25 DIAGNOSIS — Z79899 Other long term (current) drug therapy: Secondary | ICD-10-CM | POA: Diagnosis not present

## 2023-07-25 DIAGNOSIS — I10 Essential (primary) hypertension: Secondary | ICD-10-CM | POA: Diagnosis not present

## 2023-07-25 DIAGNOSIS — M546 Pain in thoracic spine: Secondary | ICD-10-CM | POA: Diagnosis not present

## 2023-07-25 DIAGNOSIS — M62838 Other muscle spasm: Secondary | ICD-10-CM | POA: Diagnosis not present

## 2023-07-25 DIAGNOSIS — R030 Elevated blood-pressure reading, without diagnosis of hypertension: Secondary | ICD-10-CM | POA: Diagnosis not present

## 2023-07-29 DIAGNOSIS — Z79899 Other long term (current) drug therapy: Secondary | ICD-10-CM | POA: Diagnosis not present

## 2023-08-22 DIAGNOSIS — M62838 Other muscle spasm: Secondary | ICD-10-CM | POA: Diagnosis not present

## 2023-08-22 DIAGNOSIS — G629 Polyneuropathy, unspecified: Secondary | ICD-10-CM | POA: Diagnosis not present

## 2023-08-22 DIAGNOSIS — M545 Low back pain, unspecified: Secondary | ICD-10-CM | POA: Diagnosis not present

## 2023-08-22 DIAGNOSIS — R030 Elevated blood-pressure reading, without diagnosis of hypertension: Secondary | ICD-10-CM | POA: Diagnosis not present

## 2023-08-22 DIAGNOSIS — Z79899 Other long term (current) drug therapy: Secondary | ICD-10-CM | POA: Diagnosis not present

## 2023-08-22 DIAGNOSIS — M546 Pain in thoracic spine: Secondary | ICD-10-CM | POA: Diagnosis not present

## 2023-08-25 ENCOUNTER — Other Ambulatory Visit (HOSPITAL_COMMUNITY): Payer: Self-pay | Admitting: Psychiatry

## 2023-09-04 ENCOUNTER — Telehealth (HOSPITAL_COMMUNITY): Payer: Medicare Other | Admitting: Psychiatry

## 2023-09-09 ENCOUNTER — Encounter (HOSPITAL_COMMUNITY): Payer: Self-pay | Admitting: Psychiatry

## 2023-09-09 ENCOUNTER — Telehealth (INDEPENDENT_AMBULATORY_CARE_PROVIDER_SITE_OTHER): Payer: Medicare Other | Admitting: Psychiatry

## 2023-09-09 DIAGNOSIS — F319 Bipolar disorder, unspecified: Secondary | ICD-10-CM | POA: Diagnosis not present

## 2023-09-09 DIAGNOSIS — F1020 Alcohol dependence, uncomplicated: Secondary | ICD-10-CM

## 2023-09-09 MED ORDER — CARBAMAZEPINE ER 200 MG PO TB12: ORAL_TABLET | ORAL | 2 refills | Status: DC

## 2023-09-09 MED ORDER — BUPROPION HCL ER (SR) 150 MG PO TB12: 150.0000 mg | ORAL_TABLET | Freq: Three times a day (TID) | ORAL | 2 refills | Status: DC

## 2023-09-09 MED ORDER — BUSPIRONE HCL 30 MG PO TABS: ORAL_TABLET | ORAL | 2 refills | Status: DC

## 2023-09-09 MED ORDER — ALPRAZOLAM 2 MG PO TABS: ORAL_TABLET | ORAL | 2 refills | Status: DC

## 2023-09-09 MED ORDER — SERTRALINE HCL 50 MG PO TABS: 50.0000 mg | ORAL_TABLET | Freq: Every day | ORAL | 2 refills | Status: DC

## 2023-09-09 MED ORDER — BELSOMRA 20 MG PO TABS: 20.0000 mg | ORAL_TABLET | Freq: Every day | ORAL | 2 refills | Status: DC

## 2023-09-09 NOTE — Progress Notes (Signed)
Virtual Visit via Video Note  I connected with Edward Lutz on 09/09/23 at  9:00 AM EDT by a video enabled telemedicine application and verified that I am speaking with the correct person using two identifiers.  Location: Patient: home Provider: office   I discussed the limitations of evaluation and management by telemedicine and the availability of in person appointments. The patient expressed understanding and agreed to proceed.     I discussed the assessment and treatment plan with the patient. The patient was provided an opportunity to ask questions and all were answered. The patient agreed with the plan and demonstrated an understanding of the instructions.   The patient was advised to call back or seek an in-person evaluation if the symptoms worsen or if the condition fails to improve as anticipated.  I provided 15 minutes of non-face-to-face time during this encounter.   [name], MD  [facility] MD/PA/NP OP Progress Note  09/09/2023 9:13 AM Edward Lutz  MRN:  [id number]  Chief Complaint:  Chief Complaint  Patient presents with   Depression   Anxiety   Manic Behavior   Follow-up   HPI: This patient is a 42 year old married white male who lives with his wife and 3 children. They recently got custody of his 49-year-old nephew as well. He is on disability for mental illness.   The patient returns for follow-up after 4 weeks.  Last time he stated he was more depressed and isolating himself.  His anxiety had also gone up.  We added Zoloft 50 mg to his regimen.  However he stated that it caused problems with sexual function so he has cut it back to 25 mg for the last 2 weeks.  The sexual functioning seems to be better although not totally back to normal.  He does think it is helped his anxiety and mood.  He seems a bit more upbeat.  I asked if the side effects outweigh the positive effects and he does not think so at this time.  He would like to stick with it for a little bit  longer.  He denies any thoughts of self-harm or suicide.  He also states that he is cut back on the alcohol use Visit Diagnosis:    ICD-10-CM   1. Bipolar 1 disorder (HCC)  F31.9       Past Psychiatric History: Psychiatric admission in 2009  Past Medical History:  Past Medical History:  Diagnosis Date   Back pain    Bipolar disorder (HCC)    HTN (hypertension)    OSA (obstructive sleep apnea)     Past Surgical History:  Procedure Laterality Date   RHINOPLASTY     TONSILLECTOMY      Family Psychiatric History: See below  Family History:  Family History  Problem Relation Age of Onset   Bipolar disorder Mother    Drug abuse Mother    Anxiety disorder Mother    Paranoid behavior Mother    Physical abuse Mother    Bipolar disorder Father    Alcohol abuse Father    Drug abuse Father    Anxiety disorder Father    Paranoid behavior Father    Physical abuse Father    Anxiety disorder Sister    Depression Sister    ADD / ADHD Daughter    Dementia Neg Hx    OCD Neg Hx    Schizophrenia Neg Hx    Seizures Neg Hx    Sexual abuse Neg Hx  Social History:  Social History   Socioeconomic History   Marital status: Married    Spouse name: Not on file   Number of children: Not on file   Years of education: Not on file   Highest education level: Not on file  Occupational History   Not on file  Tobacco Use   Smoking status: Every Day    Current packs/day: 1.00    Average packs/day: 1 pack/day for 15.0 years (15.0 ttl pk-yrs)    Types: Cigarettes   Smokeless tobacco: Never   Tobacco comments:    16-20 cigarettes a day as of 06/08/2013  Substance and Sexual Activity   Alcohol use: Yes    Alcohol/week: 36.0 standard drinks of alcohol    Types: 36 Cans of beer per week    Comment: Reports drinks 5-6 beers a day.    Drug use: No   Sexual activity: Yes    Partners: Female    Birth control/protection: None  Other Topics Concern   Not on file  Social History  Narrative   Not on file   Social Determinants of Health   Financial Resource Strain: Low Risk  (11/22/2022)   Received from [facility], [facility]   Overall Financial Resource Strain (CARDIA)    Difficulty of Paying Living Expenses: Not hard at all  Food Insecurity: No Food Insecurity (11/22/2022)   Received from [facility], [facility]   Hunger Vital Sign    Worried About Running Out of Food in the Last Year: Never true    Ran Out of Food in the Last Year: Never true  Transportation Needs: No Transportation Needs (09/17/2021)   Received from [employer], [facility]   PRAPARE - Transportation    Lack of Transportation (Medical): No    Lack of Transportation (Non-Medical): No  Physical Activity: Inactive (11/22/2022)   Received from [facility], [facility]   Exercise Vital Sign    Days of Exercise per Week: 0 days    Minutes of Exercise per Session: 0 min  Stress: Stress Concern Present (11/22/2022)   Received from [facility], [facility] of Occup[facility] - Occupational Stress Questionnaire    Feeling of Stress : Rather much  Social Connections: Somewhat Isolated (11/22/2022)   Received from [facility], [facility]   Social Network    How would you rate your social network (family, work, friends)?: Restricted participation with some degree of social isolation    Allergies:  Allergies  Allergen Reactions   Penicillins Rash    Has patient had a PCN reaction causing immediate rash, facial/tongue/throat swelling, SOB or lightheadedness with hypotension: Yes Has patient had a PCN reaction causing severe rash involving mucus membranes or skin necrosis: No Has patient had a PCN reaction that required hospitalization No Has patient had a PCN reaction occurring within the last 10 years: No If all of the above answers are "NO", then may proceed with Cephalosporin use.     Metabolic Disorder Labs: Lab Results  Component Value Date    HGBA1C 5.6 12/30/2012   MPG 114 12/30/2012   No results found for: "PROLACTIN" No results found for: "CHOL", "TRIG", "HDL", "CHOLHDL", "VLDL", "LDLCALC" No results found for: "TSH"  Therapeutic Level Labs: Lab Results  Component Value Date   LITHIUM 1.05 01/21/2012   LITHIUM 0.97 10/24/2011   Lab Results  Component Value Date   VALPROATE 60.5 06/16/2008   Lab Results  Component Value Date   CBMZ 11.2  11/20/2016   CBMZ 9.1 08/03/2015    Current Medications: Current Outpatient Medications  Medication Sig Dispense Refill   albuterol (VENTOLIN HFA) 108 (90 Base) MCG/ACT inhaler Inhale 1-2 puffs into the lungs every 6 (six) hours as needed for wheezing or shortness of breath. 18 g 0   alprazolam (XANAX) 2 MG tablet TAKE ONE TABLET BY MOUTH 3 TIMES A DAY. 270 tablet 2   amLODipine (NORVASC) 10 MG tablet Take 5 mg by mouth at bedtime.      benzonatate (TESSALON) 100 MG capsule Take 1-2 capsules (100-200 mg total) by mouth 3 (three) times daily as needed for cough. 60 capsule 0   buPROPion (WELLBUTRIN SR) 150 MG 12 hr tablet Take 1 tablet (150 mg total) by mouth 3 (three) times daily. 90 tablet 2   busPIRone (BUSPAR) 30 MG tablet TAKE (1) TABLET BY MOUTH (3) TIMES DAILY. 90 tablet 2   carbamazepine (TEGRETOL XR) 200 MG 12 hr tablet TAKE 8 TABLETS BY MOUTH AT BEDTIME. 240 tablet 2   clindamycin (CLEOCIN T) 1 % SWAB      levETIRAcetam (KEPPRA) 500 MG tablet Take 1,000 mg by mouth 2 (two) times daily.      lisinopril-hydrochlorothiazide (PRINZIDE,ZESTORETIC) 20-12.5 MG tablet Take 1 tablet by mouth daily.      Lurasidone HCl 120 MG TABS Take 1 tablet (120 mg total) by mouth daily with supper. 90 tablet 2   minocycline (MINOCIN,DYNACIN) 100 MG capsule Take 100 mg by mouth daily.      oxyCODONE-acetaminophen (PERCOCET) 10-325 MG tablet Take 1 tablet by mouth every 6 (six) hours as needed.     phentermine (ADIPEX-P) 37.5 MG tablet Take 37.5 mg by mouth daily.      promethazine-dextromethorphan (PROMETHAZINE-DM) 6.25-15 MG/5ML syrup Take 5 mLs by mouth at bedtime as needed for cough. 100 mL 0   rosuvastatin (CRESTOR) 10 MG tablet Take 10 mg by mouth at bedtime.     sertraline (ZOLOFT) 50 MG tablet Take 1 tablet (50 mg total) by mouth daily. 30 tablet 2   Suvorexant (BELSOMRA) 20 MG TABS Take 1 tablet (20 mg total) by mouth at bedtime. 30 tablet 2   tamsulosin (FLOMAX) 0.4 MG CAPS capsule Take 0.4 mg by mouth daily.     tiZANidine (ZANAFLEX) 4 MG tablet Take 4 mg by mouth 3 (three) times daily.     tretinoin (RETIN-A) 0.05 % cream Apply 1 application topically 2 (two) times daily.     XTAMPZA ER 27 MG C12A Take 1 capsule by mouth 2 (two) times daily.     No current facility-administered medications for this visit.     Musculoskeletal: Strength & Muscle Tone: within normal limits Gait & Station: normal Patient leans: N/A  Psychiatric Specialty Exam: Review of Systems  Psychiatric/Behavioral:  The patient is nervous/anxious.   All other systems reviewed and are negative.   There were no vitals taken for this visit.There is no height or weight on file to calculate BMI.  General Appearance: Casual and Fairly Groomed  Eye Contact:  Good  Speech:  Clear and Coherent  Volume:  Normal  Mood:  Anxious and Euthymic  Affect:  Congruent  Thought Process:  Goal Directed  Orientation:  Full (Time, Place, and Person)  Thought Content: WDL   Suicidal Thoughts:  No  Homicidal Thoughts:  No  Memory:  Immediate;   Good Recent;   Good Remote;   Good  Judgement:  Good  Insight:  Good  Psychomotor Activity:  Normal  Concentration:  Concentration: Good and Attention Span: Good  Recall:  Good  Fund of Knowledge: Good  Language: Good  Akathisia:  No  Handed:  Right  AIMS (if indicated): not done  Assets:  Communication Skills Desire for Improvement Physical Health Resilience Social Support Talents/Skills  ADL's:  Intact  Cognition: WNL  Sleep:   Good   Screenings: PHQ2-9    Flowsheet Row Video Visit from 07/23/2022 in [facility] at [place] Video Visit from 05/01/2022 in [facility] at [place] Video Visit from 02/01/2022 in [facility] at [place] Video Visit from 11/07/2021 in [facility] at [place] Video Visit from 08/08/2021 in [facility] at [facility] Total Score 1 1 1 1  0      Flowsheet Row Video Visit from 07/23/2022 in [place] at [place] Video Visit from 05/01/2022 in [facility] at [place] Video Visit from 02/01/2022 in [facility] at [place]  C-SSRS RISK CATEGORY No Risk No Risk No Risk        Assessment and Plan: This patient is a 42 year old male with a history of bipolar disorder depression obsessional symptoms and episodic alcohol abuse.  He would like to continue the Zoloft at 25 mg for his anxiety/depression.  He will also continue Wellbutrin SR 450 mg daily for depression, Tegretol 200 mg - 8 tablets at bedtime for mood stabilization, lurasidone 120 mg daily for mood stabilization, Xanax 2 mg 3 times daily as well as BuSpar 30 mg 3 times daily for anxiety and BuSpar 20 mg at bedtime for sleep.  He will return to see me in 3 months  Collaboration of Care: Collaboration of Care: Primary Care Provider AEB notes will be shared with PCP at patient's request  Patient/Guardian was advised Release of Information must be obtained prior to any record release in order to collaborate their care with an outside provider. Patient/Guardian was advised if they have not already done so to contact the registration department to sign all necessary forms in order for [place] to release information regarding their care.   Consent: Patient/Guardian gives verbal consent for treatment and  assignment of benefits for services provided during this visit. Patient/Guardian expressed understanding and agreed to proceed.    [name], MD 09/09/2023, 9:13 AM

## 2023-09-19 DIAGNOSIS — E7800 Pure hypercholesterolemia, unspecified: Secondary | ICD-10-CM | POA: Diagnosis not present

## 2023-09-19 DIAGNOSIS — I10 Essential (primary) hypertension: Secondary | ICD-10-CM | POA: Diagnosis not present

## 2023-09-19 DIAGNOSIS — M546 Pain in thoracic spine: Secondary | ICD-10-CM | POA: Diagnosis not present

## 2023-09-19 DIAGNOSIS — Z131 Encounter for screening for diabetes mellitus: Secondary | ICD-10-CM | POA: Diagnosis not present

## 2023-09-19 DIAGNOSIS — E559 Vitamin D deficiency, unspecified: Secondary | ICD-10-CM | POA: Diagnosis not present

## 2023-09-19 DIAGNOSIS — M545 Low back pain, unspecified: Secondary | ICD-10-CM | POA: Diagnosis not present

## 2023-09-19 DIAGNOSIS — M62838 Other muscle spasm: Secondary | ICD-10-CM | POA: Diagnosis not present

## 2023-09-19 DIAGNOSIS — Z79899 Other long term (current) drug therapy: Secondary | ICD-10-CM | POA: Diagnosis not present

## 2023-09-19 DIAGNOSIS — R030 Elevated blood-pressure reading, without diagnosis of hypertension: Secondary | ICD-10-CM | POA: Diagnosis not present

## 2023-09-23 DIAGNOSIS — Z79899 Other long term (current) drug therapy: Secondary | ICD-10-CM | POA: Diagnosis not present

## 2023-10-19 ENCOUNTER — Other Ambulatory Visit (HOSPITAL_COMMUNITY): Payer: Self-pay | Admitting: Psychiatry

## 2023-10-21 DIAGNOSIS — Z79899 Other long term (current) drug therapy: Secondary | ICD-10-CM | POA: Diagnosis not present

## 2023-10-21 DIAGNOSIS — M62838 Other muscle spasm: Secondary | ICD-10-CM | POA: Diagnosis not present

## 2023-10-21 DIAGNOSIS — M545 Low back pain, unspecified: Secondary | ICD-10-CM | POA: Diagnosis not present

## 2023-10-21 DIAGNOSIS — I10 Essential (primary) hypertension: Secondary | ICD-10-CM | POA: Diagnosis not present

## 2023-10-28 ENCOUNTER — Other Ambulatory Visit (HOSPITAL_COMMUNITY): Payer: Self-pay | Admitting: Psychiatry

## 2023-11-03 ENCOUNTER — Other Ambulatory Visit (HOSPITAL_COMMUNITY): Payer: Self-pay | Admitting: Psychiatry

## 2023-11-18 DIAGNOSIS — I10 Essential (primary) hypertension: Secondary | ICD-10-CM | POA: Diagnosis not present

## 2023-11-18 DIAGNOSIS — M62838 Other muscle spasm: Secondary | ICD-10-CM | POA: Diagnosis not present

## 2023-11-18 DIAGNOSIS — F17210 Nicotine dependence, cigarettes, uncomplicated: Secondary | ICD-10-CM | POA: Diagnosis not present

## 2023-11-18 DIAGNOSIS — M545 Low back pain, unspecified: Secondary | ICD-10-CM | POA: Diagnosis not present

## 2023-11-18 DIAGNOSIS — Z79899 Other long term (current) drug therapy: Secondary | ICD-10-CM | POA: Diagnosis not present

## 2023-11-21 DIAGNOSIS — Z79899 Other long term (current) drug therapy: Secondary | ICD-10-CM | POA: Diagnosis not present

## 2023-12-18 ENCOUNTER — Telehealth (HOSPITAL_COMMUNITY): Payer: Medicare Other | Admitting: Psychiatry

## 2023-12-18 ENCOUNTER — Encounter (HOSPITAL_COMMUNITY): Payer: Self-pay | Admitting: Psychiatry

## 2023-12-18 DIAGNOSIS — F319 Bipolar disorder, unspecified: Secondary | ICD-10-CM | POA: Diagnosis not present

## 2023-12-18 MED ORDER — ALPRAZOLAM 2 MG PO TABS: ORAL_TABLET | ORAL | 2 refills | Status: DC

## 2023-12-18 MED ORDER — LURASIDONE HCL 120 MG PO TABS: 120.0000 mg | ORAL_TABLET | Freq: Every day | ORAL | 2 refills | Status: DC

## 2023-12-18 MED ORDER — CARBAMAZEPINE ER 200 MG PO TB12: ORAL_TABLET | ORAL | 2 refills | Status: DC

## 2023-12-18 MED ORDER — SERTRALINE HCL 50 MG PO TABS: 50.0000 mg | ORAL_TABLET | Freq: Every day | ORAL | 2 refills | Status: DC

## 2023-12-18 MED ORDER — BELSOMRA 20 MG PO TABS: 20.0000 mg | ORAL_TABLET | Freq: Every day | ORAL | 2 refills | Status: DC

## 2023-12-18 MED ORDER — BUPROPION HCL ER (SR) 150 MG PO TB12: 150.0000 mg | ORAL_TABLET | Freq: Three times a day (TID) | ORAL | 2 refills | Status: DC

## 2023-12-18 MED ORDER — BUSPIRONE HCL 30 MG PO TABS: ORAL_TABLET | ORAL | 2 refills | Status: DC

## 2023-12-18 NOTE — Progress Notes (Signed)
 Virtual Visit via Video Note  I connected with Edward Lutz on 12/18/23 at  8:40 AM EST by a video enabled telemedicine application and verified that I am speaking with the correct person using two identifiers.  Location: Patient: home Provider: office   I discussed the limitations of evaluation and management by telemedicine and the availability of in person appointments. The patient expressed understanding and agreed to proceed.     I discussed the assessment and treatment plan with the patient. The patient was provided an opportunity to ask questions and all were answered. The patient agreed with the plan and demonstrated an understanding of the instructions.   The patient was advised to call back or seek an in-person evaluation if the symptoms worsen or if the condition fails to improve as anticipated.  I provided 20 minutes of non-face-to-face time during this encounter.   Barnie Gull, MD  [facility] MD/PA/NP OP Progress Note  12/18/2023 8:57 AM Edward Lutz  MRN:  [id number]  Chief Complaint:  Chief Complaint  Patient presents with   Depression   Anxiety   Manic Behavior   Follow-up   HPI: This patient is a 43 year old married white male who lives with his wife and 3 children. They recently got custody of his 57-year-old nephew as well. He is on disability for mental illness.   The patient returns for follow-up after 3 months regarding his bipolar disorder and anxiety.  Overall he continues to do fairly well.  He did get anxious over the holidays because he does not like being around people at least not crowds.  However now he seems to be back to his baseline.  He states that he is drinking much less than he used to.  He denies serious anxiety right now.  He denies significant depression or thoughts of self-harm.  His sleep is still variable.  In the past he was diagnosed with sleep apnea but has not used CPAP recently because he did not feel like it helped.  He is going to  address this with his PCP.  A few months ago we added sertraline  to his regimen and it does seem to be helping his anxiety Visit Diagnosis:    ICD-10-CM   1. Bipolar 1 disorder (HCC)  F31.9       Past Psychiatric History: Psychiatric admission in 2009  Past Medical History:  Past Medical History:  Diagnosis Date   Back pain    Bipolar disorder (HCC)    HTN (hypertension)    OSA (obstructive sleep apnea)     Past Surgical History:  Procedure Laterality Date   RHINOPLASTY     TONSILLECTOMY      Family Psychiatric History: See below  Family History:  Family History  Problem Relation Age of Onset   Bipolar disorder Mother    Drug abuse Mother    Anxiety disorder Mother    Paranoid behavior Mother    Physical abuse Mother    Bipolar disorder Father    Alcohol abuse Father    Drug abuse Father    Anxiety disorder Father    Paranoid behavior Father    Physical abuse Father    Anxiety disorder Sister    Depression Sister    ADD / ADHD Daughter    Dementia Neg Hx    OCD Neg Hx    Schizophrenia Neg Hx    Seizures Neg Hx    Sexual abuse Neg Hx     Social History:  Social History  Socioeconomic History   Marital status: Married    Spouse name: Not on file   Number of children: Not on file   Years of education: Not on file   Highest education level: Not on file  Occupational History   Not on file  Tobacco Use   Smoking status: Every Day    Current packs/day: 1.00    Average packs/day: 1 pack/day for 15.0 years (15.0 ttl pk-yrs)    Types: Cigarettes   Smokeless tobacco: Never   Tobacco comments:    16-20 cigarettes a day as of 06/08/2013  Substance and Sexual Activity   Alcohol use: Yes    Alcohol/week: 36.0 standard drinks of alcohol    Types: 36 Cans of beer per week    Comment: Reports drinks 5-6 beers a day.    Drug use: No   Sexual activity: Yes    Partners: Female    Birth control/protection: None  Other Topics Concern   Not on file  Social  History Narrative   Not on file   Social Drivers of Health   Financial Resource Strain: Low Risk  (11/22/2022)   Received from [facility], [facility]   Overall Financial Resource Strain (CARDIA)    Difficulty of Paying Living Expenses: Not hard at all  Food Insecurity: No Food Insecurity (11/22/2022)   [facility] from [facility], [facility]   Hunger Vital Sign    Worried About Running Out of Food in the Last Year: Never true    Ran Out of Food in the Last Year: Never true  Transportation Needs: No Transportation Needs (09/17/2021)   Received from [employer], [facility]   PRAPARE - Transportation    Lack of Transportation (Medical): No    Lack of Transportation (Non-Medical): No  Physical Activity: Inactive (11/22/2022)   [facility] from [facility], [facility]   Exercise Vital Sign    Days of Exercise per Week: 0 days    Minutes of Exercise per Session: 0 min  Stress: Stress Concern Present (11/22/2022)   Received from [facility], [facility] of Occupational Health - Occupational Stress Questionnaire    Feeling of Stress : Rather much  Social Connections: Somewhat Isolated (11/22/2022)   [facility] from [facility], [facility]   Social Network    How would you rate your social network (family, work, friends)?: Restricted participation with some degree of social isolation    Allergies:  Allergies  Allergen Reactions   Penicillins Rash    Has patient had a PCN reaction causing immediate rash, facial/tongue/throat swelling, SOB or lightheadedness with hypotension: Yes Has patient had a PCN reaction causing severe rash involving mucus membranes or skin necrosis: No Has patient had a PCN reaction that required hospitalization No Has patient had a PCN reaction occurring within the last 10 years: No If all of the above answers are NO, then may proceed with Cephalosporin use.     Metabolic Disorder Labs: Lab Results  Component Value  Date   HGBA1C 5.6 12/30/2012   MPG 114 12/30/2012   No results found for: PROLACTIN No results found for: CHOL, TRIG, HDL, CHOLHDL, VLDL, LDLCALC No results found for: TSH  Therapeutic Level Labs: Lab Results  Component Value Date   LITHIUM  1.05 01/21/2012   LITHIUM  0.97 10/24/2011   Lab Results  Component Value Date   VALPROATE 60.5 06/16/2008   Lab Results  Component Value Date   CBMZ 11.2 11/20/2016   CBMZ 9.1 08/03/2015  Current Medications: Current Outpatient Medications  Medication Sig Dispense Refill   albuterol  (VENTOLIN  HFA) 108 (90 Base) MCG/ACT inhaler Inhale 1-2 puffs into the lungs every 6 (six) hours as needed for wheezing or shortness of breath. 18 g 0   alprazolam  (XANAX ) 2 MG tablet TAKE ONE TABLET BY MOUTH 3 TIMES A DAY. 270 tablet 2   amLODipine (NORVASC) 10 MG tablet Take 5 mg by mouth at bedtime.      benzonatate  (TESSALON ) 100 MG capsule Take 1-2 capsules (100-200 mg total) by mouth 3 (three) times daily as needed for cough. 60 capsule 0   buPROPion  (WELLBUTRIN  SR) 150 MG 12 hr tablet Take 1 tablet (150 mg total) by mouth 3 (three) times daily. 90 tablet 2   busPIRone  (BUSPAR ) 30 MG tablet TAKE (1) TABLET BY MOUTH (3) TIMES DAILY. 90 tablet 2   carbamazepine  (TEGRETOL  XR) 200 MG 12 hr tablet TAKE 8 TABLETS BY MOUTH AT BEDTIME. 240 tablet 2   clindamycin (CLEOCIN T) 1 % SWAB      levETIRAcetam (KEPPRA) 500 MG tablet Take 1,000 mg by mouth 2 (two) times daily.      lisinopril-hydrochlorothiazide (PRINZIDE,ZESTORETIC) 20-12.5 MG tablet Take 1 tablet by mouth daily.      Lurasidone  HCl 120 MG TABS Take 1 tablet (120 mg total) by mouth daily with supper. 90 tablet 2   minocycline (MINOCIN,DYNACIN) 100 MG capsule Take 100 mg by mouth daily.      oxyCODONE-acetaminophen  (PERCOCET) 10-325 MG tablet Take 1 tablet by mouth every 6 (six) hours as needed.     phentermine (ADIPEX-P) 37.5 MG tablet Take 37.5 mg by mouth daily.     rosuvastatin  (CRESTOR) 10 MG tablet Take 10 mg by mouth at bedtime.     sertraline  (ZOLOFT ) 50 MG tablet Take 1 tablet (50 mg total) by mouth daily. 30 tablet 2   Suvorexant  (BELSOMRA ) 20 MG TABS Take 1 tablet (20 mg total) by mouth at bedtime. 30 tablet 2   tamsulosin (FLOMAX) 0.4 MG CAPS capsule Take 0.4 mg by mouth daily.     tiZANidine (ZANAFLEX) 4 MG tablet Take 4 mg by mouth 3 (three) times daily.     tretinoin (RETIN-A) 0.05 % cream Apply 1 application topically 2 (two) times daily.     XTAMPZA ER 27 MG C12A Take 1 capsule by mouth 2 (two) times daily.     No current facility-administered medications for this visit.     Musculoskeletal: Strength & Muscle Tone: within normal limits Gait & Station: normal Patient leans: N/A  Psychiatric Specialty Exam: Review of Systems  Musculoskeletal:  Positive for back pain.  Psychiatric/Behavioral:  The patient is nervous/anxious.   All other systems reviewed and are negative.   There were no vitals taken for this visit.There is no height or weight on file to calculate BMI.  General Appearance: Casual and Fairly Groomed  Eye Contact:  Good  Speech:  Clear and Coherent  Volume:  Normal  Mood:  Anxious and Euthymic  Affect:  Congruent  Thought Process:  Goal Directed  Orientation:  Full (Time, Place, and Person)  Thought Content: WDL   Suicidal Thoughts:  No  Homicidal Thoughts:  No  Memory:  Immediate;   Good Recent;   Good Remote;   Fair  Judgement:  Good  Insight:  Good  Psychomotor Activity:  Normal  Concentration:  Concentration: Good and Attention Span: Good  Recall:  Good  Fund of Knowledge: Good  Language: Good  Akathisia:  No  Handed:  Right  AIMS (if indicated): not done  Assets:  Communication Skills Desire for Improvement Physical Health Resilience Social Support  ADL's:  Intact  Cognition: WNL  Sleep:  Fair   Screenings: PHQ2-9    Flowsheet Row Video Visit from 07/23/2022 in [facility] at  [place] Video Visit from 05/01/2022 in [facility] at [place] Video Visit from 02/01/2022 in [facility] at [place] Video Visit from 11/07/2021 in [facility] Health at [place] Video Visit from 08/08/2021 in [facility] at [facility] Total Score 1 1 1 1  0      Flowsheet Row Video Visit from 07/23/2022 in [place] [facility] at [place] Video Visit from 05/01/2022 in [facility] at [place] Video Visit from 02/01/2022 in [facility]patient [facility] at [place]  C-SSRS RISK CATEGORY No Risk No Risk No Risk        Assessment and Plan: This patient is a 43 year old male with a history of bipolar disorder depression obsessional symptoms with episodic alcohol abuse.  He is doing well on his current regimen.  He will continue Zoloft  25 mg for anxiety depression, Wellbutrin  SR 450 mg daily for depression, Tegretol  200 mg - 8 tablets at bedtime for mood stabilization, lurasidone  120 mg daily for mood stabilization, Xanax  2 mg 3 times daily as well as as BuSpar  30 mg 3 times daily for anxiety and Belsomra  20 mg at bedtime for sleep.  He will return to see me in 3 months  Collaboration of Care: Collaboration of Care: Primary Care Provider AEB notes to be shared with PCP at patient's request  Patient/Guardian was advised Release of Information must be obtained prior to any record release in order to collaborate their care with an outside provider. Patient/Guardian was advised if they have not already done so to contact the registration department to sign all necessary forms in order for us  to release information regarding their care.   Consent: Patient/Guardian gives verbal consent for treatment and assignment of benefits for services provided during this visit. Patient/Guardian expressed understanding and agreed to  proceed.    Barnie Gull, MD 12/18/2023, 8:57 AM

## 2023-12-19 DIAGNOSIS — M545 Low back pain, unspecified: Secondary | ICD-10-CM | POA: Diagnosis not present

## 2023-12-19 DIAGNOSIS — I10 Essential (primary) hypertension: Secondary | ICD-10-CM | POA: Diagnosis not present

## 2023-12-19 DIAGNOSIS — M62838 Other muscle spasm: Secondary | ICD-10-CM | POA: Diagnosis not present

## 2023-12-19 DIAGNOSIS — F17210 Nicotine dependence, cigarettes, uncomplicated: Secondary | ICD-10-CM | POA: Diagnosis not present

## 2023-12-19 DIAGNOSIS — G4730 Sleep apnea, unspecified: Secondary | ICD-10-CM | POA: Diagnosis not present

## 2023-12-19 DIAGNOSIS — Z79899 Other long term (current) drug therapy: Secondary | ICD-10-CM | POA: Diagnosis not present

## 2023-12-23 DIAGNOSIS — Z79899 Other long term (current) drug therapy: Secondary | ICD-10-CM | POA: Diagnosis not present

## 2023-12-31 DIAGNOSIS — G4710 Hypersomnia, unspecified: Secondary | ICD-10-CM | POA: Diagnosis not present

## 2023-12-31 DIAGNOSIS — M545 Low back pain, unspecified: Secondary | ICD-10-CM | POA: Diagnosis not present

## 2023-12-31 DIAGNOSIS — I10 Essential (primary) hypertension: Secondary | ICD-10-CM | POA: Diagnosis not present

## 2023-12-31 DIAGNOSIS — E7800 Pure hypercholesterolemia, unspecified: Secondary | ICD-10-CM | POA: Diagnosis not present

## 2023-12-31 DIAGNOSIS — G629 Polyneuropathy, unspecified: Secondary | ICD-10-CM | POA: Diagnosis not present

## 2024-01-02 DIAGNOSIS — G4752 REM sleep behavior disorder: Secondary | ICD-10-CM | POA: Diagnosis not present

## 2024-01-02 DIAGNOSIS — R0902 Hypoxemia: Secondary | ICD-10-CM | POA: Diagnosis not present

## 2024-01-02 DIAGNOSIS — G4730 Sleep apnea, unspecified: Secondary | ICD-10-CM | POA: Diagnosis not present

## 2024-01-16 DIAGNOSIS — I10 Essential (primary) hypertension: Secondary | ICD-10-CM | POA: Diagnosis not present

## 2024-01-16 DIAGNOSIS — M545 Low back pain, unspecified: Secondary | ICD-10-CM | POA: Diagnosis not present

## 2024-01-16 DIAGNOSIS — R0683 Snoring: Secondary | ICD-10-CM | POA: Diagnosis not present

## 2024-01-19 DIAGNOSIS — Z79899 Other long term (current) drug therapy: Secondary | ICD-10-CM | POA: Diagnosis not present

## 2024-01-19 DIAGNOSIS — M62838 Other muscle spasm: Secondary | ICD-10-CM | POA: Diagnosis not present

## 2024-01-19 DIAGNOSIS — F17210 Nicotine dependence, cigarettes, uncomplicated: Secondary | ICD-10-CM | POA: Diagnosis not present

## 2024-01-19 DIAGNOSIS — M545 Low back pain, unspecified: Secondary | ICD-10-CM | POA: Diagnosis not present

## 2024-01-19 DIAGNOSIS — I10 Essential (primary) hypertension: Secondary | ICD-10-CM | POA: Diagnosis not present

## 2024-02-05 ENCOUNTER — Other Ambulatory Visit (HOSPITAL_COMMUNITY): Payer: Self-pay | Admitting: Psychiatry

## 2024-02-16 DIAGNOSIS — M5441 Lumbago with sciatica, right side: Secondary | ICD-10-CM | POA: Diagnosis not present

## 2024-02-16 DIAGNOSIS — I10 Essential (primary) hypertension: Secondary | ICD-10-CM | POA: Diagnosis not present

## 2024-02-16 DIAGNOSIS — G8929 Other chronic pain: Secondary | ICD-10-CM | POA: Diagnosis not present

## 2024-02-16 DIAGNOSIS — Z0000 Encounter for general adult medical examination without abnormal findings: Secondary | ICD-10-CM | POA: Diagnosis not present

## 2024-02-16 DIAGNOSIS — Z79899 Other long term (current) drug therapy: Secondary | ICD-10-CM | POA: Diagnosis not present

## 2024-02-16 DIAGNOSIS — F17210 Nicotine dependence, cigarettes, uncomplicated: Secondary | ICD-10-CM | POA: Diagnosis not present

## 2024-02-18 DIAGNOSIS — Z79899 Other long term (current) drug therapy: Secondary | ICD-10-CM | POA: Diagnosis not present

## 2024-03-15 DIAGNOSIS — E559 Vitamin D deficiency, unspecified: Secondary | ICD-10-CM | POA: Diagnosis not present

## 2024-03-15 DIAGNOSIS — Z79899 Other long term (current) drug therapy: Secondary | ICD-10-CM | POA: Diagnosis not present

## 2024-03-15 DIAGNOSIS — Z131 Encounter for screening for diabetes mellitus: Secondary | ICD-10-CM | POA: Diagnosis not present

## 2024-03-15 DIAGNOSIS — E7800 Pure hypercholesterolemia, unspecified: Secondary | ICD-10-CM | POA: Diagnosis not present

## 2024-03-15 DIAGNOSIS — M546 Pain in thoracic spine: Secondary | ICD-10-CM | POA: Diagnosis not present

## 2024-03-15 DIAGNOSIS — F17210 Nicotine dependence, cigarettes, uncomplicated: Secondary | ICD-10-CM | POA: Diagnosis not present

## 2024-03-15 DIAGNOSIS — M129 Arthropathy, unspecified: Secondary | ICD-10-CM | POA: Diagnosis not present

## 2024-03-15 DIAGNOSIS — G8929 Other chronic pain: Secondary | ICD-10-CM | POA: Diagnosis not present

## 2024-03-15 DIAGNOSIS — M5441 Lumbago with sciatica, right side: Secondary | ICD-10-CM | POA: Diagnosis not present

## 2024-03-15 DIAGNOSIS — I10 Essential (primary) hypertension: Secondary | ICD-10-CM | POA: Diagnosis not present

## 2024-03-17 DIAGNOSIS — Z79899 Other long term (current) drug therapy: Secondary | ICD-10-CM | POA: Diagnosis not present

## 2024-03-18 ENCOUNTER — Other Ambulatory Visit (HOSPITAL_COMMUNITY): Payer: Self-pay | Admitting: Psychiatry

## 2024-03-27 IMAGING — MR MR HEAD WO/W CM
13 of 14 series · 38 of 48 positions shown · IV contrast (gadavist)
Comparison: None.

CLINICAL DATA: Dizziness.

EXAM:
MRI HEAD WITHOUT AND WITH CONTRAST
TECHNIQUE: Multiplanar, multiecho pulse sequences of the brain and surrounding
structures were obtained without and with intravenous contrast.
CONTRAST:  10mL GADAVIST GADOBUTROL 1 MMOL/ML IV SOLN

[Series 5: DWI · axial · 3.0mm · 0.77mm/px · z∈[-87,+60]mm · 2 of 50 slices shown (1 of 4)]
[im 1/50]
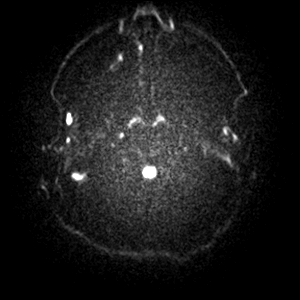
[im 50/50]
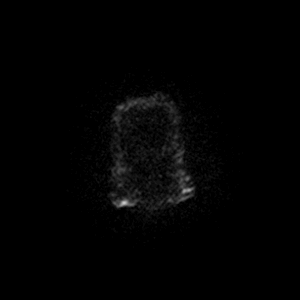

[Series 6: DWI · axial · 3.0mm · 0.77mm/px · z∈[-87,+60]mm · 2 of 50 slices shown (2 of 4)]
[im 1/50]
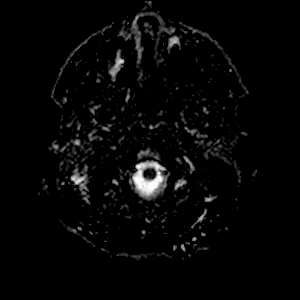
[im 50/50]
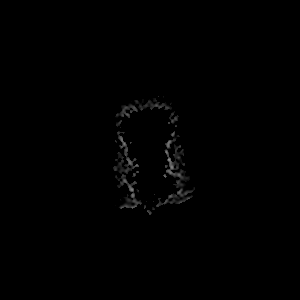

[Series 7: DWI · coronal · 5.0mm · 0.88mm/px · 2 of 28 slices shown (3 of 4)]
[im 1/28]
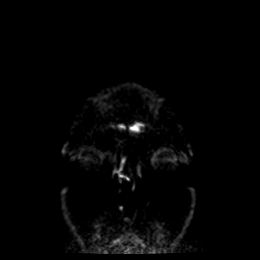
[im 28/28]
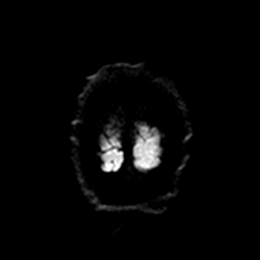

[Series 8: DWI · coronal · 5.0mm · 0.88mm/px · 2 of 28 slices shown (4 of 4)]
[im 1/28]
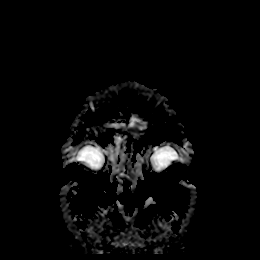
[im 28/28]
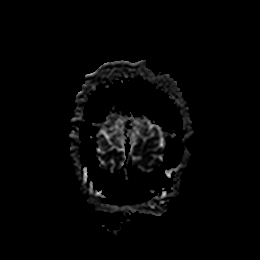

[Series 9: T1 · sagittal · 5.0mm · 0.75mm/px · 1 of 21 slices shown (1 of 2)]
[im 1/21]
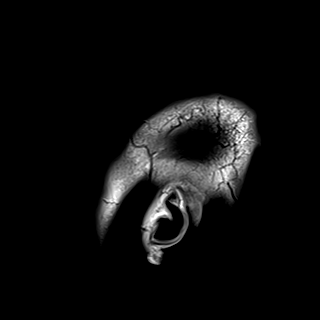

[Series 10: T2 · axial · 5.0mm · 0.72mm/px · 1 of 23 slices shown (1 of 2)]
[im 1/23]
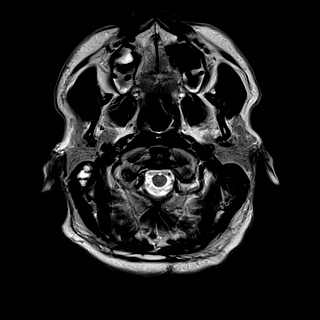

[Series 11: mag_images · axial · 3.0mm · 0.90mm/px · z∈[-102,+74]mm · 4 of 60 slices shown]
[im 1/60]
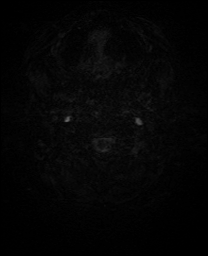
[im 20/60]
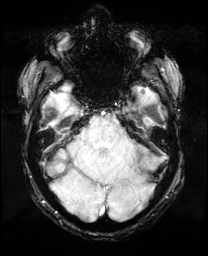
[im 40/60]
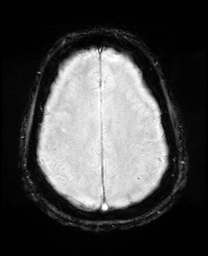
[im 60/60]
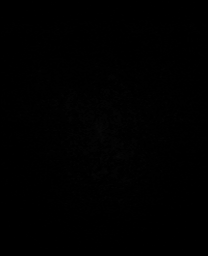

[Series 12: pha_images · axial · 3.0mm · 0.90mm/px · z∈[-102,+65]mm · 3 of 57 slices shown]
[im 1/57]
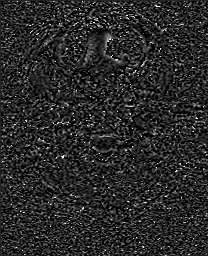
[im 29/57]
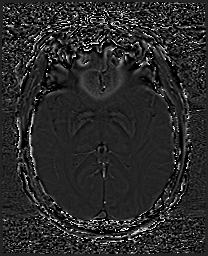
[im 57/57]
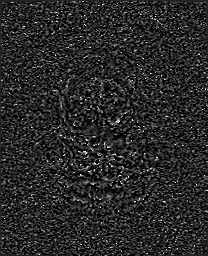

[Series 13: swi_images · axial · 3.0mm · 0.90mm/px · z∈[-102,+74]mm · 4 of 60 slices shown]
[im 1/60]
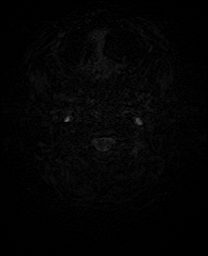
[im 20/60]
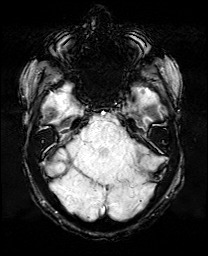
[im 40/60]
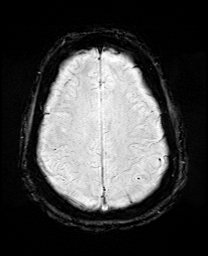
[im 60/60]
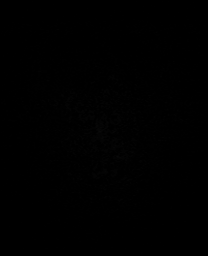

[Series 15: FLAIR · axial · 3.0mm · 0.45mm/px · z∈[-87,+59]mm · 3 of 50 slices shown]
[im 1/50]
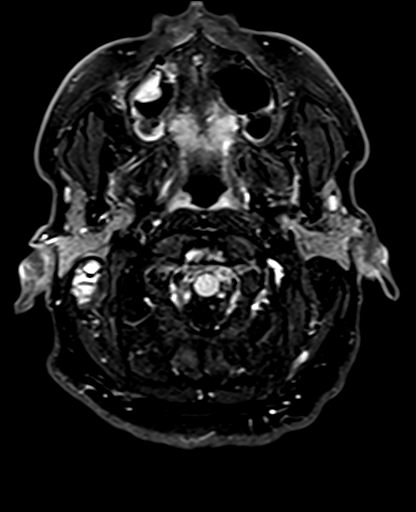
[im 25/50]
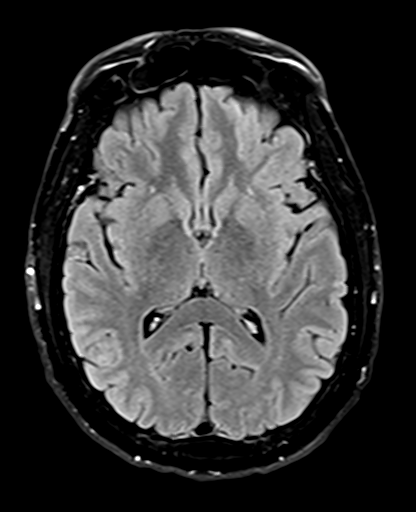
[im 50/50]
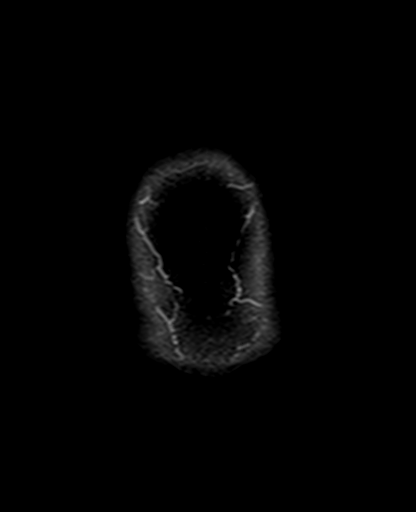

[Series 16: T1 · axial · 1.0mm · 0.98mm/px · z∈[-94,+65]mm · 10 of 160 slices shown (2 of 2)]
[im 1/160]
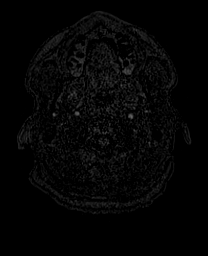
[im 18/160]
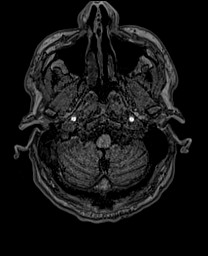
[im 36/160]
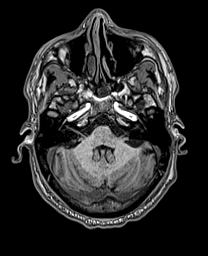
[im 54/160]
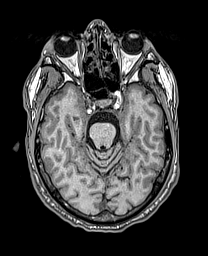
[im 71/160]
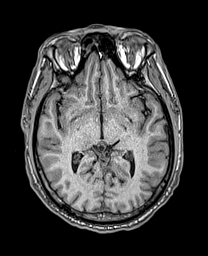
[im 89/160]
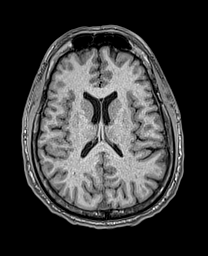
[im 107/160]
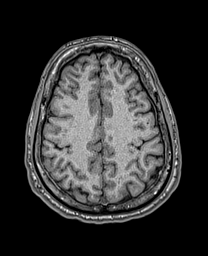
[im 124/160]
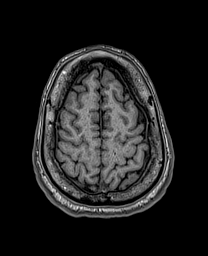
[im 142/160]
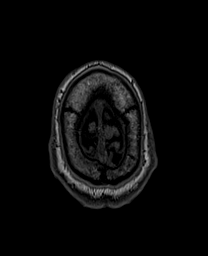
[im 160/160]
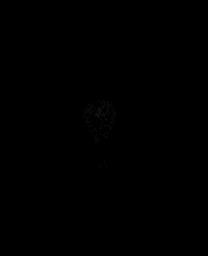

[Series 17: T2 · coronal · 5.0mm · 0.72mm/px · 2 of 28 slices shown (2 of 2)]
[im 1/28]
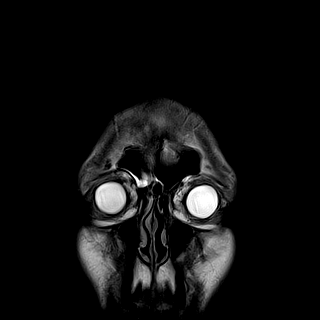
[im 28/28]
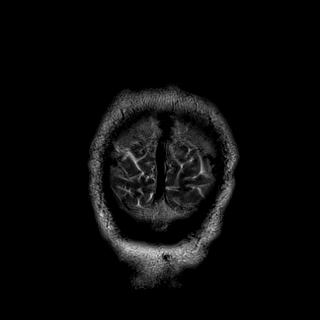

[Series 19: T1 post-contrast · coronal · 5.0mm · 0.34mm/px · 2 of 28 slices shown]
[im 1/28]
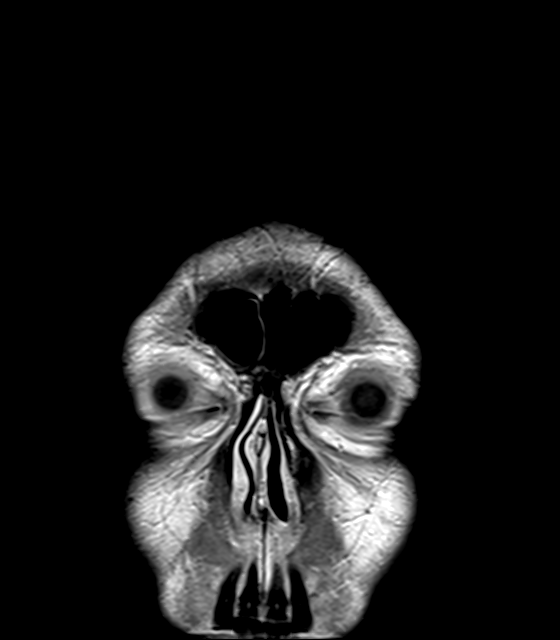
[im 28/28]
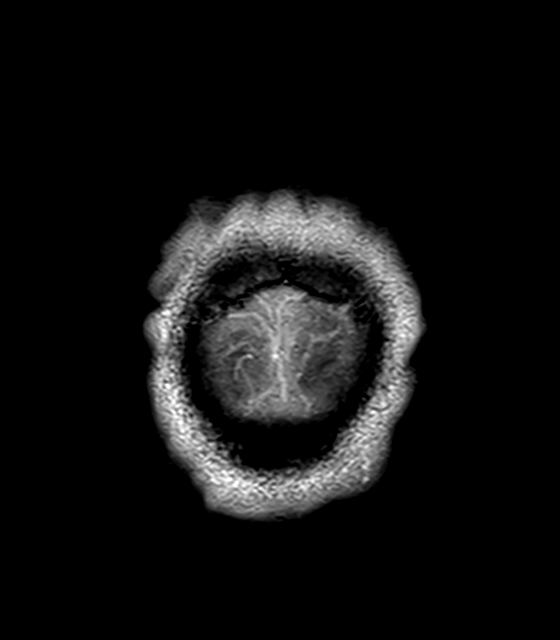

[38 of 48 positions shown; findings below may reference images not displayed]

FINDINGS: Brain: No acute infarction, hemorrhage, hydrocephalus, extra-axial
collection or mass lesion. Mild scattered frontal predominant
T2/FLAIR hyperintensities in the white matter. No pathologic
enhancement.

Vascular: Major arterial flow voids are maintained at the skull
base.

Skull and upper cervical spine: Normal marrow signal.

Sinuses/Orbits: Mild scattered paranasal sinus mucosal thickening.
No acute orbital findings.

Other: Moderate mastoid effusion.
IMPRESSION: 1. No evidence of acute intracranial abnormality.
2. Mild scattered frontal predominant T2/FLAIR hyperintensities in
the white matter, nonspecific but compatible with chronic
microvascular ischemic disease and/or sequela of chronic migraines.
3. Moderate right mastoid effusion.

## 2024-04-07 ENCOUNTER — Other Ambulatory Visit (HOSPITAL_COMMUNITY): Payer: Self-pay | Admitting: Psychiatry

## 2024-04-14 DIAGNOSIS — F17210 Nicotine dependence, cigarettes, uncomplicated: Secondary | ICD-10-CM | POA: Diagnosis not present

## 2024-04-14 DIAGNOSIS — G8929 Other chronic pain: Secondary | ICD-10-CM | POA: Diagnosis not present

## 2024-04-14 DIAGNOSIS — E559 Vitamin D deficiency, unspecified: Secondary | ICD-10-CM | POA: Diagnosis not present

## 2024-04-14 DIAGNOSIS — Z79899 Other long term (current) drug therapy: Secondary | ICD-10-CM | POA: Diagnosis not present

## 2024-04-14 DIAGNOSIS — I10 Essential (primary) hypertension: Secondary | ICD-10-CM | POA: Diagnosis not present

## 2024-04-14 DIAGNOSIS — M5441 Lumbago with sciatica, right side: Secondary | ICD-10-CM | POA: Diagnosis not present

## 2024-04-15 DIAGNOSIS — Z79899 Other long term (current) drug therapy: Secondary | ICD-10-CM | POA: Diagnosis not present

## 2024-04-16 ENCOUNTER — Encounter (HOSPITAL_COMMUNITY): Payer: Self-pay | Admitting: Psychiatry

## 2024-04-16 ENCOUNTER — Telehealth (INDEPENDENT_AMBULATORY_CARE_PROVIDER_SITE_OTHER): Admitting: Psychiatry

## 2024-04-16 DIAGNOSIS — F319 Bipolar disorder, unspecified: Secondary | ICD-10-CM | POA: Diagnosis not present

## 2024-04-16 MED ORDER — BUPROPION HCL ER (SR) 150 MG PO TB12
150.0000 mg | ORAL_TABLET | Freq: Three times a day (TID) | ORAL | 2 refills | Status: DC
Start: 2024-04-16 — End: 2024-07-13

## 2024-04-16 MED ORDER — BUSPIRONE HCL 30 MG PO TABS: ORAL_TABLET | ORAL | 2 refills | Status: DC

## 2024-04-16 MED ORDER — BELSOMRA 20 MG PO TABS: 20.0000 mg | ORAL_TABLET | Freq: Every day | ORAL | 2 refills | Status: DC

## 2024-04-16 MED ORDER — LURASIDONE HCL 120 MG PO TABS: 120.0000 mg | ORAL_TABLET | Freq: Every day | ORAL | 2 refills | Status: DC

## 2024-04-16 MED ORDER — SERTRALINE HCL 50 MG PO TABS: 50.0000 mg | ORAL_TABLET | Freq: Every day | ORAL | 2 refills | Status: DC

## 2024-04-16 MED ORDER — CARBAMAZEPINE ER 200 MG PO TB12: ORAL_TABLET | ORAL | 2 refills | Status: DC

## 2024-04-16 MED ORDER — ALPRAZOLAM 2 MG PO TABS: 2.0000 mg | ORAL_TABLET | Freq: Four times a day (QID) | ORAL | 2 refills | Status: DC

## 2024-04-16 NOTE — Progress Notes (Signed)
 Virtual Visit via Video Note  I connected with Edward Lutz on 04/16/24 at  9:20 AM EDT by a video enabled telemedicine application and verified that I am speaking with the correct person using two identifiers.  Location: Patient: home Provider: office   I discussed the limitations of evaluation and management by telemedicine and the availability of in person appointments. The patient expressed understanding and agreed to proceed.     I discussed the assessment and treatment plan with the patient. The patient was provided an opportunity to ask questions and all were answered. The patient agreed with the plan and demonstrated an understanding of the instructions.   The patient was advised to call back or seek an in-person evaluation if the symptoms worsen or if the condition fails to improve as anticipated.  I provided 20 minutes of non-face-to-face time during this encounter.   Alfredia Annas, MD  [facility] MD/PA/NP OP Progress Note  04/16/2024 9:35 AM Edward Lutz  MRN:  [id number]  Chief Complaint:  Chief Complaint  Patient presents with   Anxiety   Depression   Follow-up   HPI: This patient is a 43 year old married white male who lives with his wife and 3 children. They recently got custody of his 68-year-old nephew as well. He is on disability for mental illness.   The patient returns for follow-up after 4 months regarding bipolar disorder and anxiety.  He states that at times he still gets somewhat depressed but not suicidal.  He is doing a lot of the work and caring for his 2 nephews ages 18 and 39.  He does not really like getting out around people.  In the past we cut down his Xanax  because he is on pain medicine but he has spoken to the pain management PA and apparently she is in agreement with going back up.  He states that the lower dose is not helping his anxiety is much.  His sleep is variable but the Belsomra  has helped.  He states he has cut way back on the drinking.  He  denies any thoughts of suicide or self-harm. Visit Diagnosis:    ICD-10-CM   1. Bipolar 1 disorder (HCC)  F31.9       Past Psychiatric History: Psychiatric admission in 2009  Past Medical History:  Past Medical History:  Diagnosis Date   Back pain    Bipolar disorder (HCC)    HTN (hypertension)    OSA (obstructive sleep apnea)     Past Surgical History:  Procedure Laterality Date   RHINOPLASTY     TONSILLECTOMY      Family Psychiatric History: See below  Family History:  Family History  Problem Relation Age of Onset   Bipolar disorder Mother    Drug abuse Mother    Anxiety disorder Mother    Paranoid behavior Mother    Physical abuse Mother    Bipolar disorder Father    Alcohol abuse Father    Drug abuse Father    Anxiety disorder Father    Paranoid behavior Father    Physical abuse Father    Anxiety disorder Sister    Depression Sister    ADD / ADHD Daughter    Dementia Neg Hx    OCD Neg Hx    Schizophrenia Neg Hx    Seizures Neg Hx    Sexual abuse Neg Hx     Social History:  Social History   Socioeconomic History   Marital status: Married  Spouse name: Not on file   Number of children: Not on file   Years of education: Not on file   Highest education level: Not on file  Occupational History   Not on file  Tobacco Use   Smoking status: Every Day    Current packs/day: 1.00    Average packs/day: 1 pack/day for 15.0 years (15.0 ttl pk-yrs)    Types: Cigarettes   Smokeless tobacco: Never   Tobacco comments:    16-20 cigarettes a day as of 06/08/2013  Substance and Sexual Activity   Alcohol use: Yes    Alcohol/week: 36.0 standard drinks of alcohol    Types: 36 Cans of beer per week    Comment: Reports drinks 5-6 beers a day.    Drug use: No   Sexual activity: Yes    Partners: Female    Birth control/protection: None  Other Topics Concern   Not on file  Social History Narrative   Not on file   Social Drivers of Health   Financial  Resource Strain: Low Risk  (11/22/2022)   Received from [facility], [facility]   Overall Financial Resource Strain (CARDIA)    Difficulty of Paying Living Expenses: Not hard at all  Food Insecurity: No Food Insecurity (11/22/2022)   Received from [facility], [facility]   Hunger Vital Sign    Worried About Running Out of Food in the Last Year: Never true    Ran Out of Food in the Last Year: Never true  Transportation Needs: No Transportation Needs (09/17/2021)   Received from [employer], [facility]   PRAPARE - Transportation    Lack of Transportation (Medical): No    Lack of Transportation (Non-Medical): No  Physical Activity: Inactive (11/22/2022)   Received from [facility], [facility]   Exercise Vital Sign    Days of Exercise per Week: 0 days    Minutes of Exercise per Session: 0 min  Stress: Stress Concern Present (11/22/2022)   Received from [facility], [facility] of Occupational Health - Occupational Stress Questionnaire    Feeling of Stress : Rather much  Social Connections: Somewhat Isolated (11/22/2022)   Received from [facility], [facility]   Social Network    How would you rate your social network (family, work, friends)?: Restricted participation with some degree of social isolation    Allergies:  Allergies  Allergen Reactions   Penicillins Rash    Has patient had a PCN reaction causing immediate rash, facial/tongue/throat swelling, SOB or lightheadedness with hypotension: Yes Has patient had a PCN reaction causing severe rash involving mucus membranes or skin necrosis: No Has patient had a PCN reaction that required hospitalization No Has patient had a PCN reaction occurring within the last 10 years: No If all of the above answers are "NO", then may proceed with Cephalosporin use.     Metabolic Disorder Labs: Lab Results  Component Value Date   HGBA1C 5.6 12/30/2012   MPG 114 12/30/2012   No results found for:  "PROLACTIN" No results found for: "CHOL", "TRIG", "HDL", "CHOLHDL", "VLDL", "LDLCALC" No results found for: "TSH"  Therapeutic Level Labs: Lab Results  Component Value Date   LITHIUM  1.05 01/21/2012   LITHIUM  0.97 10/24/2011   Lab Results  Component Value Date   VALPROATE 60.5 06/16/2008   Lab Results  Component Value Date   CBMZ 11.2 11/20/2016   CBMZ 9.1 08/03/2015    Current Medications: Current Outpatient Medications  Medication Sig  Dispense Refill   albuterol  (VENTOLIN  HFA) 108 (90 Base) MCG/ACT inhaler Inhale 1-2 puffs into the lungs every 6 (six) hours as needed for wheezing or shortness of breath. 18 g 0   alprazolam  (XANAX ) 2 MG tablet Take 1 tablet (2 mg total) by mouth in the morning, at noon, in the evening, and at bedtime. 360 tablet 2   amLODipine (NORVASC) 10 MG tablet Take 5 mg by mouth at bedtime.      benzonatate  (TESSALON ) 100 MG capsule Take 1-2 capsules (100-200 mg total) by mouth 3 (three) times daily as needed for cough. 60 capsule 0   buPROPion  (WELLBUTRIN  SR) 150 MG 12 hr tablet Take 1 tablet (150 mg total) by mouth 3 (three) times daily. 90 tablet 2   busPIRone  (BUSPAR ) 30 MG tablet TAKE (1) TABLET BY MOUTH (3) TIMES DAILY. 90 tablet 2   carbamazepine  (TEGRETOL  XR) 200 MG 12 hr tablet TAKE 8 TABLETS BY MOUTH AT BEDTIME. 240 tablet 2   clindamycin (CLEOCIN T) 1 % SWAB      levETIRAcetam (KEPPRA) 500 MG tablet Take 1,000 mg by mouth 2 (two) times daily.      lisinopril-hydrochlorothiazide (PRINZIDE,ZESTORETIC) 20-12.5 MG tablet Take 1 tablet by mouth daily.      Lurasidone  HCl 120 MG TABS Take 1 tablet (120 mg total) by mouth daily with supper. 90 tablet 2   minocycline (MINOCIN,DYNACIN) 100 MG capsule Take 100 mg by mouth daily.      oxyCODONE-acetaminophen  (PERCOCET) 10-325 MG tablet Take 1 tablet by mouth every 6 (six) hours as needed.     phentermine (ADIPEX-P) 37.5 MG tablet Take 37.5 mg by mouth daily.     rosuvastatin (CRESTOR) 10 MG tablet Take 10  mg by mouth at bedtime.     sertraline  (ZOLOFT ) 50 MG tablet Take 1 tablet (50 mg total) by mouth daily. 30 tablet 2   Suvorexant  (BELSOMRA ) 20 MG TABS Take 1 tablet (20 mg total) by mouth at bedtime. 30 tablet 2   tamsulosin (FLOMAX) 0.4 MG CAPS capsule Take 0.4 mg by mouth daily.     tiZANidine (ZANAFLEX) 4 MG tablet Take 4 mg by mouth 3 (three) times daily.     tretinoin (RETIN-A) 0.05 % cream Apply 1 application topically 2 (two) times daily.     XTAMPZA ER 27 MG C12A Take 1 capsule by mouth 2 (two) times daily.     No current facility-administered medications for this visit.     Musculoskeletal: Strength & Muscle Tone: within normal limits Gait & Station: normal Patient leans: N/A  Psychiatric Specialty Exam: Review of Systems  Musculoskeletal:  Positive for back pain.  Psychiatric/Behavioral:  The patient is nervous/anxious.   All other systems reviewed and are negative.   There were no vitals taken for this visit.There is no height or weight on file to calculate BMI.  General Appearance: Casual and Fairly Groomed  Eye Contact:  Good  Speech:  Clear and Coherent  Volume:  Normal  Mood:  Anxious  Affect:  Congruent  Thought Process:  Goal Directed  Orientation:  Full (Time, Place, and Person)  Thought Content: Rumination   Suicidal Thoughts:  No  Homicidal Thoughts:  No  Memory:  Immediate;   Good Recent;   Fair Remote;   Good  Judgement:  Good  Insight:  Fair  Psychomotor Activity:  Normal  Concentration:  Concentration: Good and Attention Span: Good  Recall:  Good  Fund of Knowledge: Good  Language: Good  Akathisia:  No  Handed:  Right  AIMS (if indicated): not done  Assets:  Communication Skills Desire for Improvement Resilience Social Support  ADL's:  Intact  Cognition: WNL  Sleep:  Fair   Screenings: PHQ2-9    Flowsheet Row Video Visit from 07/23/2022 in [facility] [place] Video Visit from 05/01/2022 in [facility] at [place] Video Visit from 02/01/2022 in [facility] [place] Video Visit from 11/07/2021 in [facility] at [place] Video Visit from 08/08/2021 in [facility] Total Score 1 1 1 1  0      Flowsheet Row Video Visit from 07/23/2022 in [facility] [place] Video Visit from 05/01/2022 in [facility] at [place] Video Visit from 02/01/2022 in [facility] at [place]  C-SSRS RISK CATEGORY No Risk No Risk No Risk        Assessment and Plan: This patient is a 43 year old male with a history of bipolar disorder, depression obsessional symptoms and episodic alcohol abuse.  He states he has been more anxious on the lower Xanax  so we will increase Xanax  back to 2 mg 4 times daily as well as BuSpar  30 mg 3 times daily for anxiety.  He will continue Zoloft  50 mg for anxiety and depression, Wellbutrin  SR 450 mg daily for depression, Tegretol  200 mg - 8 tablets at bedtime for mood stabilization and lurasidone  120 mg daily for mood stabilization.  He was reminded not to combine any of the psychiatric drugs with his pain medicines, particularly not the Xanax .  He will return to see me in 3 months  Collaboration of Care: Collaboration of Care: Primary Care Provider AEB notes will be shared with PCP at patient's request  Patient/Guardian was advised Release of Information must be obtained prior to any record release in order to collaborate their care with an outside provider. Patient/Guardian was advised if they have not already done so to contact the registration department to sign all necessary forms in order for us  to release information regarding their care.   Consent: Patient/Guardian gives verbal consent for treatment and assignment of benefits for services provided during this  visit. Patient/Guardian expressed understanding and agreed to proceed.    Alfredia Annas, MD 04/16/2024, 9:35 AM

## 2024-05-07 DIAGNOSIS — I10 Essential (primary) hypertension: Secondary | ICD-10-CM | POA: Diagnosis not present

## 2024-05-07 DIAGNOSIS — Z131 Encounter for screening for diabetes mellitus: Secondary | ICD-10-CM | POA: Diagnosis not present

## 2024-05-07 DIAGNOSIS — E559 Vitamin D deficiency, unspecified: Secondary | ICD-10-CM | POA: Diagnosis not present

## 2024-05-12 DIAGNOSIS — G8929 Other chronic pain: Secondary | ICD-10-CM | POA: Diagnosis not present

## 2024-05-12 DIAGNOSIS — Z79899 Other long term (current) drug therapy: Secondary | ICD-10-CM | POA: Diagnosis not present

## 2024-05-12 DIAGNOSIS — F17210 Nicotine dependence, cigarettes, uncomplicated: Secondary | ICD-10-CM | POA: Diagnosis not present

## 2024-05-12 DIAGNOSIS — I10 Essential (primary) hypertension: Secondary | ICD-10-CM | POA: Diagnosis not present

## 2024-05-12 DIAGNOSIS — M5441 Lumbago with sciatica, right side: Secondary | ICD-10-CM | POA: Diagnosis not present

## 2024-05-12 DIAGNOSIS — E559 Vitamin D deficiency, unspecified: Secondary | ICD-10-CM | POA: Diagnosis not present

## 2024-05-13 DIAGNOSIS — E559 Vitamin D deficiency, unspecified: Secondary | ICD-10-CM | POA: Diagnosis not present

## 2024-05-13 DIAGNOSIS — G629 Polyneuropathy, unspecified: Secondary | ICD-10-CM | POA: Diagnosis not present

## 2024-05-13 DIAGNOSIS — Z0000 Encounter for general adult medical examination without abnormal findings: Secondary | ICD-10-CM | POA: Diagnosis not present

## 2024-05-13 DIAGNOSIS — G8929 Other chronic pain: Secondary | ICD-10-CM | POA: Diagnosis not present

## 2024-05-13 DIAGNOSIS — Z0001 Encounter for general adult medical examination with abnormal findings: Secondary | ICD-10-CM | POA: Diagnosis not present

## 2024-05-13 DIAGNOSIS — I10 Essential (primary) hypertension: Secondary | ICD-10-CM | POA: Diagnosis not present

## 2024-05-13 DIAGNOSIS — Z23 Encounter for immunization: Secondary | ICD-10-CM | POA: Diagnosis not present

## 2024-05-13 DIAGNOSIS — Z79899 Other long term (current) drug therapy: Secondary | ICD-10-CM | POA: Diagnosis not present

## 2024-05-13 DIAGNOSIS — D7589 Other specified diseases of blood and blood-forming organs: Secondary | ICD-10-CM | POA: Diagnosis not present

## 2024-05-13 DIAGNOSIS — E785 Hyperlipidemia, unspecified: Secondary | ICD-10-CM | POA: Diagnosis not present

## 2024-06-11 DIAGNOSIS — G8929 Other chronic pain: Secondary | ICD-10-CM | POA: Diagnosis not present

## 2024-06-11 DIAGNOSIS — Z79899 Other long term (current) drug therapy: Secondary | ICD-10-CM | POA: Diagnosis not present

## 2024-06-11 DIAGNOSIS — M5441 Lumbago with sciatica, right side: Secondary | ICD-10-CM | POA: Diagnosis not present

## 2024-06-11 DIAGNOSIS — F17210 Nicotine dependence, cigarettes, uncomplicated: Secondary | ICD-10-CM | POA: Diagnosis not present

## 2024-06-11 DIAGNOSIS — E559 Vitamin D deficiency, unspecified: Secondary | ICD-10-CM | POA: Diagnosis not present

## 2024-06-11 DIAGNOSIS — I10 Essential (primary) hypertension: Secondary | ICD-10-CM | POA: Diagnosis not present

## 2024-06-15 DIAGNOSIS — Z79899 Other long term (current) drug therapy: Secondary | ICD-10-CM | POA: Diagnosis not present

## 2024-07-09 DIAGNOSIS — M5441 Lumbago with sciatica, right side: Secondary | ICD-10-CM | POA: Diagnosis not present

## 2024-07-09 DIAGNOSIS — G8929 Other chronic pain: Secondary | ICD-10-CM | POA: Diagnosis not present

## 2024-07-09 DIAGNOSIS — F17210 Nicotine dependence, cigarettes, uncomplicated: Secondary | ICD-10-CM | POA: Diagnosis not present

## 2024-07-09 DIAGNOSIS — I10 Essential (primary) hypertension: Secondary | ICD-10-CM | POA: Diagnosis not present

## 2024-07-09 DIAGNOSIS — Z79899 Other long term (current) drug therapy: Secondary | ICD-10-CM | POA: Diagnosis not present

## 2024-07-13 ENCOUNTER — Telehealth (INDEPENDENT_AMBULATORY_CARE_PROVIDER_SITE_OTHER): Admitting: Psychiatry

## 2024-07-13 ENCOUNTER — Encounter (HOSPITAL_COMMUNITY): Payer: Self-pay | Admitting: Psychiatry

## 2024-07-13 DIAGNOSIS — F319 Bipolar disorder, unspecified: Secondary | ICD-10-CM

## 2024-07-13 DIAGNOSIS — Z79899 Other long term (current) drug therapy: Secondary | ICD-10-CM | POA: Diagnosis not present

## 2024-07-13 MED ORDER — BUSPIRONE HCL 30 MG PO TABS: ORAL_TABLET | ORAL | 2 refills | Status: DC

## 2024-07-13 MED ORDER — CARBAMAZEPINE ER 200 MG PO TB12: ORAL_TABLET | ORAL | 2 refills | Status: DC

## 2024-07-13 MED ORDER — BUPROPION HCL ER (SR) 150 MG PO TB12: 150.0000 mg | ORAL_TABLET | Freq: Three times a day (TID) | ORAL | 2 refills | Status: DC

## 2024-07-13 MED ORDER — LURASIDONE HCL 120 MG PO TABS: 120.0000 mg | ORAL_TABLET | Freq: Every day | ORAL | 2 refills | Status: DC

## 2024-07-13 MED ORDER — SERTRALINE HCL 50 MG PO TABS: 50.0000 mg | ORAL_TABLET | Freq: Every day | ORAL | 2 refills | Status: DC

## 2024-07-13 MED ORDER — ALPRAZOLAM 2 MG PO TABS: 2.0000 mg | ORAL_TABLET | Freq: Four times a day (QID) | ORAL | 2 refills | Status: DC

## 2024-07-13 MED ORDER — BELSOMRA 20 MG PO TABS: 20.0000 mg | ORAL_TABLET | Freq: Every day | ORAL | 2 refills | Status: DC

## 2024-07-13 NOTE — Progress Notes (Signed)
 Virtual Visit via Video Note  I connected with Edward Lutz on 07/13/24 at  9:40 AM EDT by a video enabled telemedicine application and verified that I am speaking with the correct person using two identifiers.  Location: Patient: home Provider: office   I discussed the limitations of evaluation and management by telemedicine and the availability of in person appointments. The patient expressed understanding and agreed to proceed.     I discussed the assessment and treatment plan with the patient. The patient was provided an opportunity to ask questions and all were answered. The patient agreed with the plan and demonstrated an understanding of the instructions.   The patient was advised to call back or seek an in-person evaluation if the symptoms worsen or if the condition fails to improve as anticipated.  I provided 20 minutes of non-face-to-face time during this encounter.   Barnie Gull, MD  [facility] MD/PA/NP OP Progress Note  07/13/2024 9:55 AM Edward Lutz  MRN:  [id number]  Chief Complaint:  Chief Complaint  Patient presents with   Anxiety   Depression   Manic Behavior   Follow-up   HPI: This patient is a 43 year old married white male who lives with his wife and 3 children. They recently got custody of his 23-year-old nephew as well. He is on disability for mental illness.   The patient returns for follow-up after 3 months regarding bipolar disorder and anxiety.  He states he is doing fairly well this summer.  He is staying very busy taking care of his 2 nephews ages 63 and 14.  He is doing better in terms of anxiety since we increase his Xanax  back to 2 mg 4 times a day.  His sleep is variable but his Belsomra  in combination with Xanax  helps him sleep.  His mood has been stable and he denies thoughts of self-harm or suicide. Visit Diagnosis:    ICD-10-CM   1. Bipolar 1 disorder (HCC)  F31.9       Past Psychiatric History: Psychiatric admission in 2009  Past Medical  History:  Past Medical History:  Diagnosis Date   Back pain    Bipolar disorder (HCC)    HTN (hypertension)    OSA (obstructive sleep apnea)     Past Surgical History:  Procedure Laterality Date   RHINOPLASTY     TONSILLECTOMY      Family Psychiatric History: See below  Family History:  Family History  Problem Relation Age of Onset   Bipolar disorder Mother    Drug abuse Mother    Anxiety disorder Mother    Paranoid behavior Mother    Physical abuse Mother    Bipolar disorder Father    Alcohol abuse Father    Drug abuse Father    Anxiety disorder Father    Paranoid behavior Father    Physical abuse Father    Anxiety disorder Sister    Depression Sister    ADD / ADHD Daughter    Dementia Neg Hx    OCD Neg Hx    Schizophrenia Neg Hx    Seizures Neg Hx    Sexual abuse Neg Hx     Social History:  Social History   Socioeconomic History   Marital status: Married    Spouse name: Not on file   Number of children: Not on file   Years of education: Not on file   Highest education level: Not on file  Occupational History   Not on file  Tobacco Use  Smoking status: Every Day    Current packs/day: 1.00    Average packs/day: 1 pack/day for 15.0 years (15.0 ttl pk-yrs)    Types: Cigarettes   Smokeless tobacco: Never   Tobacco comments:    16-20 cigarettes a day as of 06/08/2013  Substance and Sexual Activity   Alcohol use: Yes    Alcohol/week: 36.0 standard drinks of alcohol    Types: 36 Cans of beer per week    Comment: Reports drinks 5-6 beers a day.    Drug use: No   Sexual activity: Yes    Partners: Female    Birth control/protection: None  Other Topics Concern   Not on file  Social History Narrative   Not on file   Social Drivers of Health   Financial Resource Strain: Low Risk  (11/22/2022)   Received from [facility]   Overall Financial Resource Strain (CARDIA)    Difficulty of Paying Living Expenses: Not hard at all  Food Insecurity: No Food  Insecurity (11/22/2022)   Received from [facility]   Hunger Vital Sign    Within the past 12 months, you worried that your food would run out before you got the money to buy more.: Never true    Within the past 12 months, the food you bought just didn't last and you didn't have money to get more.: Never true  Transportation Needs: No Transportation Needs (09/17/2021)   Received from [facility] - Transportation    Lack of Transportation (Medical): No    Lack of Transportation (Non-Medical): No  Physical Activity: Inactive (11/22/2022)   Received from [facility]   Exercise Vital Sign    On average, how many days per week do you engage in moderate to strenuous exercise (like a brisk walk)?: 0 days    On average, how many minutes do you engage in exercise at this level?: 0 min  Stress: Stress Concern Present (11/22/2022)   Received from [facility] - Occupational Stress Questionnaire    Feeling of Stress : Rather much  Social Connections: Somewhat Isolated (11/22/2022)   Received from [facility]   Social Network    How would you rate your social network (family, work, friends)?: Restricted participation with some degree of social isolation    Allergies:  Allergies  Allergen Reactions   Penicillins Rash    Has patient had a PCN reaction causing immediate rash, facial/tongue/throat swelling, SOB or lightheadedness with hypotension: Yes Has patient had a PCN reaction causing severe rash involving mucus membranes or skin necrosis: No Has patient had a PCN reaction that required hospitalization No Has patient had a PCN reaction occurring within the last 10 years: No If all of the above answers are NO, then may proceed with Cephalosporin use.     Metabolic Disorder Labs: Lab Results  Component Value Date   HGBA1C 5.6 12/30/2012   MPG 114 12/30/2012   No results found for: PROLACTIN No results found for: CHOL,  TRIG, HDL, CHOLHDL, VLDL, LDLCALC No results found for: TSH  Therapeutic Level Labs: Lab Results  Component Value Date   LITHIUM  1.05 01/21/2012   LITHIUM  0.97 10/24/2011   Lab Results  Component Value Date   VALPROATE 60.5 06/16/2008   Lab Results  Component Value Date   CBMZ 11.2 11/20/2016   CBMZ 9.1 08/03/2015    Current Medications: Current Outpatient Medications  Medication Sig Dispense Refill   albuterol  (VENTOLIN  HFA) 108 (90  Base) MCG/ACT inhaler Inhale 1-2 puffs into the lungs every 6 (six) hours as needed for wheezing or shortness of breath. 18 g 0   alprazolam  (XANAX ) 2 MG tablet Take 1 tablet (2 mg total) by mouth in the morning, at noon, in the evening, and at bedtime. 360 tablet 2   amLODipine (NORVASC) 10 MG tablet Take 5 mg by mouth at bedtime.      benzonatate  (TESSALON ) 100 MG capsule Take 1-2 capsules (100-200 mg total) by mouth 3 (three) times daily as needed for cough. 60 capsule 0   buPROPion  (WELLBUTRIN  SR) 150 MG 12 hr tablet Take 1 tablet (150 mg total) by mouth 3 (three) times daily. 90 tablet 2   busPIRone  (BUSPAR ) 30 MG tablet TAKE (1) TABLET BY MOUTH (3) TIMES DAILY. 90 tablet 2   carbamazepine  (TEGRETOL  XR) 200 MG 12 hr tablet TAKE 8 TABLETS BY MOUTH AT BEDTIME. 240 tablet 2   clindamycin (CLEOCIN T) 1 % SWAB      levETIRAcetam (KEPPRA) 500 MG tablet Take 1,000 mg by mouth 2 (two) times daily.      lisinopril-hydrochlorothiazide (PRINZIDE,ZESTORETIC) 20-12.5 MG tablet Take 1 tablet by mouth daily.      Lurasidone  HCl 120 MG TABS Take 1 tablet (120 mg total) by mouth daily with supper. 90 tablet 2   minocycline (MINOCIN,DYNACIN) 100 MG capsule Take 100 mg by mouth daily.      oxyCODONE-acetaminophen  (PERCOCET) 10-325 MG tablet Take 1 tablet by mouth every 6 (six) hours as needed.     phentermine (ADIPEX-P) 37.5 MG tablet Take 37.5 mg by mouth daily.     rosuvastatin (CRESTOR) 10 MG tablet Take 10 mg by mouth at bedtime.     sertraline   (ZOLOFT ) 50 MG tablet Take 1 tablet (50 mg total) by mouth daily. 30 tablet 2   Suvorexant  (BELSOMRA ) 20 MG TABS Take 1 tablet (20 mg total) by mouth at bedtime. 30 tablet 2   tamsulosin (FLOMAX) 0.4 MG CAPS capsule Take 0.4 mg by mouth daily.     tiZANidine (ZANAFLEX) 4 MG tablet Take 4 mg by mouth 3 (three) times daily.     tretinoin (RETIN-A) 0.05 % cream Apply 1 application topically 2 (two) times daily.     XTAMPZA ER 27 MG C12A Take 1 capsule by mouth 2 (two) times daily.     No current facility-administered medications for this visit.     Musculoskeletal: Strength & Muscle Tone: within normal limits Gait & Station: normal Patient leans: N/A  Psychiatric Specialty Exam: Review of Systems  All other systems reviewed and are negative.   There were no vitals taken for this visit.There is no height or weight on file to calculate BMI.  General Appearance: Casual and Fairly Groomed  Eye Contact:  Good  Speech:  Clear and Coherent  Volume:  Normal  Mood:  Euthymic  Affect:  Congruent  Thought Process:  Goal Directed  Orientation:  Full (Time, Place, and Person)  Thought Content: WDL   Suicidal Thoughts:  No  Homicidal Thoughts:  No  Memory:  Immediate;   Good Recent;   Good Remote;   Fair  Judgement:  Good  Insight:  Good  Psychomotor Activity:  Normal  Concentration:  Concentration: Good and Attention Span: Good  Recall:  Good  Fund of Knowledge: Good  Language: Good  Akathisia:  No  Handed:  Right  AIMS (if indicated): not done  Assets:  Communication Skills Desire for Improvement Physical Health Resilience Social Support  Talents/Skills  ADL's:  Intact  Cognition: WNL  Sleep:  Fair   Screenings: PHQ2-9    Flowsheet Row Video Visit from 07/23/2022 in [facility] at [place] Video Visit from 05/01/2022 in [facility] at [place] Video Visit from 02/01/2022 in [facility] at  [place] Video Visit from 11/07/2021 in [facility] at [place] Video Visit from 08/08/2021 in [facility] at [facility] Total Score 1 1 1 1  0   Flowsheet Row Video Visit from 07/23/2022 in [facility] at [place] Video Visit from 05/01/2022 in [facility] at [place] Video Visit from 02/01/2022 in [facility] at [place]  C-SSRS RISK CATEGORY No Risk No Risk No Risk     Assessment and Plan: This patient is a 43 year old male with a history bipolar disorder depression obsessional symptoms and episodic alcohol abuse.  He is doing well on his current regimen.  He will continue Xanax  2 mg 4 times daily as well as BuSpar  30 mg 3 times daily for anxiety, Zoloft  50 mg daily for anxiety and depression, Wellbutrin  SR 450 mg daily for depression Tegretol  200 mg - 8 tablets at bedtime for mood stabilization and lurasidone  120 mg daily for mood stabilization.  He will return to see me in 3 months  Collaboration of Care: Collaboration of Care: Primary Care Provider AEB notes will be shared with PCP at patient's request  Patient/Guardian was advised Release of Information must be obtained prior to any record release in order to collaborate their care with an outside provider. Patient/Guardian was advised if they have not already done so to contact the registration department to sign all necessary forms in order for us  to release information regarding their care.   Consent: Patient/Guardian gives verbal consent for treatment and assignment of benefits for services provided during this visit. Patient/Guardian expressed understanding and agreed to proceed.    Barnie Gull, MD 07/13/2024, 9:55 AM

## 2024-07-14 ENCOUNTER — Other Ambulatory Visit (HOSPITAL_COMMUNITY): Payer: Self-pay | Admitting: Psychiatry

## 2024-10-11 ENCOUNTER — Other Ambulatory Visit (HOSPITAL_COMMUNITY): Payer: Self-pay | Admitting: Psychiatry

## 2024-10-13 ENCOUNTER — Encounter (HOSPITAL_COMMUNITY): Payer: Self-pay | Admitting: Psychiatry

## 2024-10-13 ENCOUNTER — Telehealth (HOSPITAL_COMMUNITY): Admitting: Psychiatry

## 2024-10-13 DIAGNOSIS — F319 Bipolar disorder, unspecified: Secondary | ICD-10-CM | POA: Diagnosis not present

## 2024-10-13 MED ORDER — BELSOMRA 20 MG PO TABS: 20.0000 mg | ORAL_TABLET | Freq: Every day | ORAL | 2 refills | Status: DC

## 2024-10-13 MED ORDER — DESVENLAFAXINE SUCCINATE ER 50 MG PO TB24: 50.0000 mg | ORAL_TABLET | Freq: Every day | ORAL | 3 refills | Status: DC

## 2024-10-13 MED ORDER — LURASIDONE HCL 120 MG PO TABS: 120.0000 mg | ORAL_TABLET | Freq: Every day | ORAL | 2 refills | Status: DC

## 2024-10-13 MED ORDER — CARBAMAZEPINE ER 200 MG PO TB12: ORAL_TABLET | ORAL | 2 refills | Status: DC

## 2024-10-13 MED ORDER — BUPROPION HCL ER (SR) 150 MG PO TB12: 150.0000 mg | ORAL_TABLET | Freq: Three times a day (TID) | ORAL | 2 refills | Status: DC

## 2024-10-13 MED ORDER — BUSPIRONE HCL 30 MG PO TABS: ORAL_TABLET | ORAL | 2 refills | Status: DC

## 2024-10-13 MED ORDER — ALPRAZOLAM 2 MG PO TABS: 2.0000 mg | ORAL_TABLET | Freq: Four times a day (QID) | ORAL | 2 refills | Status: DC

## 2024-10-13 NOTE — Progress Notes (Signed)
 Virtual Visit via Video Note  I connected with Edward Lutz on 10/13/24 at  9:00 AM EDT by a video enabled telemedicine application and verified that I am speaking with the correct person using two identifiers.  Location: Patient: home Provider: office   I discussed the limitations of evaluation and management by telemedicine and the availability of in person appointments. The patient expressed understanding and agreed to proceed.      I discussed the assessment and treatment plan with the patient. The patient was provided an opportunity to ask questions and all were answered. The patient agreed with the plan and demonstrated an understanding of the instructions.   The patient was advised to call back or seek an in-person evaluation if the symptoms worsen or if the condition fails to improve as anticipated.  I provided 20 minutes of non-face-to-face time during this encounter.   Barnie Gull, MD  [facility] MD/PA/NP OP Progress Note  10/13/2024 9:15 AM Edward Lutz  MRN:  [id number]  Chief Complaint:  Chief Complaint  Patient presents with   Depression   Anxiety   Follow-up   HPI: This patient is a 43 year old married white male who lives with his wife and 3 children. They recently got custody of his 81-year-old nephew as well. He is on disability for mental illness.   The patient returns for follow-up after 3 months regarding his bipolar disorder and generalized anxiety.  He states he has been a bit more depressed lately.  His mother is visiting in town and he does not want to see her.  She has a history of substance abuse and erratic behavior.  His sister also abuses substances and recently had a bad dog bite.  Although he knows these people are adults he often feels responsible for them.  He states that he is not suicidal or having any thoughts of self-harm.  However he feels that his mood is more low recently for unclear reasons.  The Zoloft  helped a bit but when we increase it  to 50 mg he has sexual side effects.  I suggested a switch to Pristiq which might not have quite as many of the side effects and he is willing to try it.  He generally sleeps about 5 hours a night. Visit Diagnosis:    ICD-10-CM   1. Bipolar 1 disorder (HCC)  F31.9       Past Psychiatric History: Psychiatric admission in 2009  Past Medical History:  Past Medical History:  Diagnosis Date   Back pain    Bipolar disorder (HCC)    HTN (hypertension)    OSA (obstructive sleep apnea)     Past Surgical History:  Procedure Laterality Date   RHINOPLASTY     TONSILLECTOMY      Family Psychiatric History: See below  Family History:  Family History  Problem Relation Age of Onset   Bipolar disorder Mother    Drug abuse Mother    Anxiety disorder Mother    Paranoid behavior Mother    Physical abuse Mother    Bipolar disorder Father    Alcohol abuse Father    Drug abuse Father    Anxiety disorder Father    Paranoid behavior Father    Physical abuse Father    Anxiety disorder Sister    Depression Sister    ADD / ADHD Daughter    Dementia Neg Hx    OCD Neg Hx    Schizophrenia Neg Hx    Seizures Neg Hx  Sexual abuse Neg Hx     Social History:  Social History   Socioeconomic History   Marital status: Married    Spouse name: Not on file   Number of children: Not on file   Years of education: Not on file   Highest education level: Not on file  Occupational History   Not on file  Tobacco Use   Smoking status: Every Day    Current packs/day: 1.00    Average packs/day: 1 pack/day for 15.0 years (15.0 ttl pk-yrs)    Types: Cigarettes   Smokeless tobacco: Never   Tobacco comments:    16-20 cigarettes a day as of 06/08/2013  Substance and Sexual Activity   Alcohol use: Yes    Alcohol/week: 36.0 standard drinks of alcohol    Types: 36 Cans of beer per week    Comment: Reports drinks 5-6 beers a day.    Drug use: No   Sexual activity: Yes    Partners: Female    Birth  control/protection: None  Other Topics Concern   Not on file  Social History Narrative   Not on file   Social Drivers of Health   Financial Resource Strain: Low Risk  (11/22/2022)   Received from [facility]   Overall Financial Resource Strain (CARDIA)    Difficulty of Paying Living Expenses: Not hard at all  Food Insecurity: No Food Insecurity (11/22/2022)   Received from [facility]   Hunger Vital Sign    Within the past 12 months, you worried that your food would run out before you got the money to buy more.: Never true    Within the past 12 months, the food you bought just didn't last and you didn't have money to get more.: Never true  Transportation Needs: No Transportation Needs (09/17/2021)   Received from [facility] - Transportation    Lack of Transportation (Medical): No    Lack of Transportation (Non-Medical): No  Physical Activity: Inactive (11/22/2022)   Received from [facility]   Exercise Vital Sign    On average, how many days per week do you engage in moderate to strenuous exercise (like a brisk walk)?: 0 days    On average, how many minutes do you engage in exercise at this level?: 0 min  Stress: Stress Concern Present (11/22/2022)   Received from [facility] - Occupational Stress Questionnaire    Feeling of Stress : Rather much  Social Connections: Somewhat Isolated (11/22/2022)   Received from [facility]   Social Network    How would you rate your social network (family, work, friends)?: Restricted participation with some degree of social isolation    Allergies:  Allergies  Allergen Reactions   Penicillins Rash    Has patient had a PCN reaction causing immediate rash, facial/tongue/throat swelling, SOB or lightheadedness with hypotension: Yes Has patient had a PCN reaction causing severe rash involving mucus membranes or skin necrosis: No Has patient had a PCN reaction that required  hospitalization No Has patient had a PCN reaction occurring within the last 10 years: No If all of the above answers are NO, then may proceed with Cephalosporin use.     Metabolic Disorder Labs: Lab Results  Component Value Date   HGBA1C 5.6 12/30/2012   MPG 114 12/30/2012   No results found for: PROLACTIN No results found for: CHOL, TRIG, HDL, CHOLHDL, VLDL, LDLCALC No results found for: TSH  Therapeutic Level Labs:  Lab Results  Component Value Date   LITHIUM  1.05 01/21/2012   LITHIUM  0.97 10/24/2011   Lab Results  Component Value Date   VALPROATE 60.5 06/16/2008   Lab Results  Component Value Date   CBMZ 11.2 11/20/2016   CBMZ 9.1 08/03/2015    Current Medications: Current Outpatient Medications  Medication Sig Dispense Refill   desvenlafaxine (PRISTIQ) 50 MG 24 hr tablet Take 1 tablet (50 mg total) by mouth daily. 30 tablet 3   albuterol  (VENTOLIN  HFA) 108 (90 Base) MCG/ACT inhaler Inhale 1-2 puffs into the lungs every 6 (six) hours as needed for wheezing or shortness of breath. 18 g 0   alprazolam  (XANAX ) 2 MG tablet Take 1 tablet (2 mg total) by mouth in the morning, at noon, in the evening, and at bedtime. 360 tablet 2   amLODipine (NORVASC) 10 MG tablet Take 5 mg by mouth at bedtime.      benzonatate  (TESSALON ) 100 MG capsule Take 1-2 capsules (100-200 mg total) by mouth 3 (three) times daily as needed for cough. 60 capsule 0   buPROPion  (WELLBUTRIN  SR) 150 MG 12 hr tablet Take 1 tablet (150 mg total) by mouth 3 (three) times daily. 90 tablet 2   busPIRone  (BUSPAR ) 30 MG tablet TAKE (1) TABLET BY MOUTH (3) TIMES DAILY. 90 tablet 2   carbamazepine  (TEGRETOL  XR) 200 MG 12 hr tablet TAKE 8 TABLETS BY MOUTH AT BEDTIME. 240 tablet 2   clindamycin (CLEOCIN T) 1 % SWAB      levETIRAcetam (KEPPRA) 500 MG tablet Take 1,000 mg by mouth 2 (two) times daily.      lisinopril-hydrochlorothiazide (PRINZIDE,ZESTORETIC) 20-12.5 MG tablet Take 1 tablet by mouth  daily.      Lurasidone  HCl 120 MG TABS Take 1 tablet (120 mg total) by mouth daily with supper. 90 tablet 2   minocycline (MINOCIN,DYNACIN) 100 MG capsule Take 100 mg by mouth daily.      oxyCODONE-acetaminophen  (PERCOCET) 10-325 MG tablet Take 1 tablet by mouth every 6 (six) hours as needed.     phentermine (ADIPEX-P) 37.5 MG tablet Take 37.5 mg by mouth daily.     rosuvastatin (CRESTOR) 10 MG tablet Take 10 mg by mouth at bedtime.     Suvorexant  (BELSOMRA ) 20 MG TABS Take 1 tablet (20 mg total) by mouth at bedtime. 30 tablet 2   tamsulosin (FLOMAX) 0.4 MG CAPS capsule Take 0.4 mg by mouth daily.     tiZANidine (ZANAFLEX) 4 MG tablet Take 4 mg by mouth 3 (three) times daily.     tretinoin (RETIN-A) 0.05 % cream Apply 1 application topically 2 (two) times daily.     XTAMPZA ER 27 MG C12A Take 1 capsule by mouth 2 (two) times daily.     No current facility-administered medications for this visit.     Musculoskeletal: Strength & Muscle Tone: within normal limits Gait & Station: normal Patient leans: N/A  Psychiatric Specialty Exam: Review of Systems  Musculoskeletal:  Positive for back pain.  Psychiatric/Behavioral:  Positive for dysphoric mood.   All other systems reviewed and are negative.   There were no vitals taken for this visit.There is no height or weight on file to calculate BMI.  General Appearance: Casual and Fairly Groomed  Eye Contact:  Good  Speech:  Clear and Coherent  Volume:  Normal  Mood:  Dysphoric  Affect:  Flat  Thought Process:  Goal Directed  Orientation:  Full (Time, Place, and Person)  Thought Content: Rumination   Suicidal  Thoughts:  No  Homicidal Thoughts:  No  Memory:  Immediate;   Good Recent;   Good Remote;   Good  Judgement:  Good  Insight:  Good  Psychomotor Activity:  Normal  Concentration:  Concentration: Good and Attention Span: Good  Recall:  Good  Fund of Knowledge: Good  Language: Good  Akathisia:  No  Handed:  Right  AIMS (if  indicated): not done  Assets:  Communication Skills Desire for Improvement Resilience Social Support Talents/Skills  ADL's:  Intact  Cognition: WNL  Sleep:  Good   Screenings: PHQ2-9    Flowsheet Row Video Visit from 07/23/2022 in [facility] at [place] Video Visit from 05/01/2022 in [facility] at [place] Video Visit from 02/01/2022 in [facility] at [place] Video Visit from 11/07/2021 in [facility] at [place] Video Visit from 08/08/2021 in [facility] at [facility] Total Score 1 1 1 1  0   Flowsheet Row Video Visit from 07/23/2022 in [facility] at [place] Video Visit from 05/01/2022 in [facility] at [place] Video Visit from 02/01/2022 in [facility] at [place]  C-SSRS RISK CATEGORY No Risk No Risk No Risk     Assessment and Plan: This patient is a 43 year old male with a history of bipolar disorder depression obsessional symptoms and episodic alcohol abuse.  He has been a bit more depressed recently so we will discontinue Zoloft  in favor of Pristiq 50 mg daily for depression as well as Wellbutrin  SR 450 mg daily for depression.  He will continue Xanax  2 mg 4 times daily as well as BuSpar  30 mg 3 times daily for anxiety, Tegretol  200 mg - 8 tablets at bedtime for mood stabilization and lurasidone  120 mg daily for mood stabilization.  He will return to the see me in 3 months or call sooner as needed  Collaboration of Care: Collaboration of Care: Primary Care Provider AEB notes will be shared with PCP at patient's request  Patient/Guardian was advised Release of Information must be obtained prior to any record release in order to collaborate their care with an outside provider. Patient/Guardian was advised if they have not already done so to  contact the registration department to sign all necessary forms in order for us  to release information regarding their care.   Consent: Patient/Guardian gives verbal consent for treatment and assignment of benefits for services provided during this visit. Patient/Guardian expressed understanding and agreed to proceed.    Barnie Gull, MD 10/13/2024, 9:15 AM

## 2024-10-20 ENCOUNTER — Other Ambulatory Visit (HOSPITAL_COMMUNITY): Payer: Self-pay | Admitting: Psychiatry

## 2025-01-03 ENCOUNTER — Telehealth (HOSPITAL_COMMUNITY): Admitting: Psychiatry

## 2025-01-03 ENCOUNTER — Encounter (HOSPITAL_COMMUNITY): Payer: Self-pay | Admitting: Psychiatry

## 2025-01-03 DIAGNOSIS — F319 Bipolar disorder, unspecified: Secondary | ICD-10-CM

## 2025-01-03 DIAGNOSIS — F411 Generalized anxiety disorder: Secondary | ICD-10-CM

## 2025-01-03 DIAGNOSIS — F1010 Alcohol abuse, uncomplicated: Secondary | ICD-10-CM

## 2025-01-03 MED ORDER — BUPROPION HCL ER (SR) 150 MG PO TB12: 150.0000 mg | ORAL_TABLET | Freq: Three times a day (TID) | ORAL | 2 refills | Status: AC

## 2025-01-03 MED ORDER — ALPRAZOLAM 2 MG PO TABS: 2.0000 mg | ORAL_TABLET | Freq: Four times a day (QID) | ORAL | 2 refills | Status: AC

## 2025-01-03 MED ORDER — BUSPIRONE HCL 30 MG PO TABS: ORAL_TABLET | ORAL | 2 refills | Status: AC

## 2025-01-03 MED ORDER — CARBAMAZEPINE ER 200 MG PO TB12: ORAL_TABLET | ORAL | 2 refills | Status: AC

## 2025-01-03 MED ORDER — LURASIDONE HCL 120 MG PO TABS: 120.0000 mg | ORAL_TABLET | Freq: Every day | ORAL | 2 refills | Status: AC

## 2025-01-03 MED ORDER — DESVENLAFAXINE SUCCINATE ER 50 MG PO TB24: 50.0000 mg | ORAL_TABLET | Freq: Every day | ORAL | 3 refills | Status: AC

## 2025-01-03 MED ORDER — BELSOMRA 20 MG PO TABS: 20.0000 mg | ORAL_TABLET | Freq: Every day | ORAL | 2 refills | Status: AC

## 2025-01-03 NOTE — Progress Notes (Signed)
 Virtual Visit via Video Note  I connected with Edward Lutz on 01/03/25 at  8:40 AM EST by a video enabled telemedicine application and verified that I am speaking with the correct person using two identifiers.  Location: Patient: home Provider: office   I discussed the limitations of evaluation and management by telemedicine and the availability of in person appointments. The patient expressed understanding and agreed to proceed.     I discussed the assessment and treatment plan with the patient. The patient was provided an opportunity to ask questions and all were answered. The patient agreed with the plan and demonstrated an understanding of the instructions.   The patient was advised to call back or seek an in-person evaluation if the symptoms worsen or if the condition fails to improve as anticipated.  I provided 20 minutes of non-face-to-face time during this encounter.   Barnie Gull, MD  [facility] MD/PA/NP OP Progress Note  01/03/2025 8:47 AM Edward Lutz  MRN:  [id number]  Chief Complaint:  Chief Complaint  Patient presents with   Depression   Anxiety   Manic Behavior   HPI: This patient is a 44 year old married white male who lives with his wife and 3 children. They recently got custody of his 35-year-old nephew as well. He is on disability for mental illness   The patient returns for follow-up after 3 months regarding his bipolar disorder and generalized anxiety disorder.  He states he has been doing well.  The holidays went well for him and his family.  His mother and sister tried to visit but he barred them from visiting knowing that they are abusing substances and can act erratically.  They have left and moved to [facility].  The patient states that his mood has been stable.  He denies significant depression.  Last time we change Zoloft  to Pristiq  due to sexual side effects and he is no longer experiencing these.  He denies significant anxiety thoughts of self-harm or  suicide.  Most of the time he is sleeping well.  He claims he has cut down on his alcohol use quite a bit. Visit Diagnosis:    ICD-10-CM   1. Bipolar 1 disorder (HCC)  F31.9       Past Psychiatric History: Psychiatric admission in 2009  Past Medical History:  Past Medical History:  Diagnosis Date   Back pain    Bipolar disorder (HCC)    HTN (hypertension)    OSA (obstructive sleep apnea)     Past Surgical History:  Procedure Laterality Date   RHINOPLASTY     TONSILLECTOMY      Family Psychiatric History: See below  Family History:  Family History  Problem Relation Age of Onset   Bipolar disorder Mother    Drug abuse Mother    Anxiety disorder Mother    Paranoid behavior Mother    Physical abuse Mother    Bipolar disorder Father    Alcohol abuse Father    Drug abuse Father    Anxiety disorder Father    Paranoid behavior Father    Physical abuse Father    Anxiety disorder Sister    Depression Sister    ADD / ADHD Daughter    Dementia Neg Hx    OCD Neg Hx    Schizophrenia Neg Hx    Seizures Neg Hx    Sexual abuse Neg Hx     Social History:  Social History   Socioeconomic History   Marital status: Married  Spouse name: Not on file   Number of children: Not on file   Years of education: Not on file   Highest education level: Not on file  Occupational History   Not on file  Tobacco Use   Smoking status: Every Day    Current packs/day: 1.00    Average packs/day: 1 pack/day for 15.0 years (15.0 ttl pk-yrs)    Types: Cigarettes   Smokeless tobacco: Never   Tobacco comments:    16-20 cigarettes a day as of 06/08/2013  Substance and Sexual Activity   Alcohol use: Yes    Alcohol/week: 36.0 standard drinks of alcohol    Types: 36 Cans of beer per week    Comment: Reports drinks 5-6 beers a day.    Drug use: No   Sexual activity: Yes    Partners: Female    Birth control/protection: None  Other Topics Concern   Not on file  Social History Narrative    Not on file   Social Drivers of Health   Tobacco Use: High Risk (01/03/2025)   Patient History    Smoking Tobacco Use: Every Day    Smokeless Tobacco Use: Never    Passive Exposure: Not on file  Financial Resource Strain: Low Risk (11/22/2022)   Received from [facility]   Overall Financial Resource Strain (CARDIA)    Difficulty of Paying Living Expenses: Not hard at all  Food Insecurity: No Food Insecurity (11/22/2022)   Received from [facility]   Epic    Within the past 12 months, you worried that your food would run out before you got the money to buy more.: Never true    Within the past 12 months, the food you bought just didn't last and you didn't have money to get more.: Never true  Transportation Needs: Not on file  Physical Activity: Inactive (11/22/2022)   Received from [facility]   Exercise Vital Sign    On average, how many days per week do you engage in moderate to strenuous exercise (like a brisk walk)?: 0 days    On average, how many minutes do you engage in exercise at this level?: 0 min  Stress: Stress Concern Present (11/22/2022)   Received from [facility] - Occupational Stress Questionnaire    Feeling of Stress : Rather much  Social Connections: Somewhat Isolated (11/22/2022)   Received from [facility]   Social Network    How would you rate your social network (family, work, friends)?: Restricted participation with some degree of social isolation  Depression (PHQ2-9): Low Risk (07/23/2022)   Depression (PHQ2-9)    PHQ-2 Score: 1  Alcohol Screen: Not on file  Housing: Not on file  Utilities: Not on file  Health Literacy: Not on file    Allergies: Allergies[1]  Metabolic Disorder Labs: Lab Results  Component Value Date   HGBA1C 5.6 12/30/2012   MPG 114 12/30/2012   No results found for: PROLACTIN No results found for: CHOL, TRIG, HDL, CHOLHDL, VLDL, LDLCALC No results found for:  TSH  Therapeutic Level Labs: Lab Results  Component Value Date   LITHIUM  1.05 01/21/2012   LITHIUM  0.97 10/24/2011   Lab Results  Component Value Date   VALPROATE 60.5 06/16/2008   Lab Results  Component Value Date   CBMZ 11.2 11/20/2016   CBMZ 9.1 08/03/2015    Current Medications: Current Outpatient Medications  Medication Sig Dispense Refill   albuterol  (VENTOLIN  HFA) 108 (90 Base) MCG/ACT  inhaler Inhale 1-2 puffs into the lungs every 6 (six) hours as needed for wheezing or shortness of breath. 18 g 0   alprazolam  (XANAX ) 2 MG tablet Take 1 tablet (2 mg total) by mouth in the morning, at noon, in the evening, and at bedtime. 360 tablet 2   amLODipine (NORVASC) 10 MG tablet Take 5 mg by mouth at bedtime.      benzonatate  (TESSALON ) 100 MG capsule Take 1-2 capsules (100-200 mg total) by mouth 3 (three) times daily as needed for cough. 60 capsule 0   buPROPion  (WELLBUTRIN  SR) 150 MG 12 hr tablet Take 1 tablet (150 mg total) by mouth 3 (three) times daily. 90 tablet 2   busPIRone  (BUSPAR ) 30 MG tablet TAKE (1) TABLET BY MOUTH (3) TIMES DAILY. 90 tablet 2   carbamazepine  (TEGRETOL  XR) 200 MG 12 hr tablet TAKE 8 TABLETS BY MOUTH AT BEDTIME. 240 tablet 2   clindamycin (CLEOCIN T) 1 % SWAB      desvenlafaxine  (PRISTIQ ) 50 MG 24 hr tablet Take 1 tablet (50 mg total) by mouth daily. 30 tablet 3   levETIRAcetam (KEPPRA) 500 MG tablet Take 1,000 mg by mouth 2 (two) times daily.      lisinopril-hydrochlorothiazide (PRINZIDE,ZESTORETIC) 20-12.5 MG tablet Take 1 tablet by mouth daily.      Lurasidone  HCl 120 MG TABS Take 1 tablet (120 mg total) by mouth daily with supper. 90 tablet 2   minocycline (MINOCIN,DYNACIN) 100 MG capsule Take 100 mg by mouth daily.      oxyCODONE-acetaminophen  (PERCOCET) 10-325 MG tablet Take 1 tablet by mouth every 6 (six) hours as needed.     phentermine (ADIPEX-P) 37.5 MG tablet Take 37.5 mg by mouth daily.     rosuvastatin (CRESTOR) 10 MG tablet Take 10 mg by  mouth at bedtime.     Suvorexant  (BELSOMRA ) 20 MG TABS Take 1 tablet (20 mg total) by mouth at bedtime. 30 tablet 2   tamsulosin (FLOMAX) 0.4 MG CAPS capsule Take 0.4 mg by mouth daily.     tiZANidine (ZANAFLEX) 4 MG tablet Take 4 mg by mouth 3 (three) times daily.     tretinoin (RETIN-A) 0.05 % cream Apply 1 application topically 2 (two) times daily.     XTAMPZA ER 27 MG C12A Take 1 capsule by mouth 2 (two) times daily.     No current facility-administered medications for this visit.     Musculoskeletal: Strength & Muscle Tone: within normal limits Gait & Station: normal Patient leans: N/A  Psychiatric Specialty Exam: Review of Systems  All other systems reviewed and are negative.   There were no vitals taken for this visit.There is no height or weight on file to calculate BMI.  General Appearance: Casual and Fairly Groomed  Eye Contact:  Good  Speech:  Clear and Coherent  Volume:  Normal  Mood:  Euthymic  Affect:  Congruent  Thought Process:  Goal Directed  Orientation:  Full (Time, Place, and Person)  Thought Content: WDL   Suicidal Thoughts:  No  Homicidal Thoughts:  No  Memory:  Immediate;   Good Recent;   Good Remote;   Good  Judgement:  Good  Insight:  Fair  Psychomotor Activity:  Normal  Concentration:  Concentration: Good and Attention Span: Good  Recall:  Good  Fund of Knowledge: Good  Language: Good  Akathisia:  No  Handed:  Right  AIMS (if indicated): not done  Assets:  Communication Skills Desire for Improvement Physical Health Resilience Social Support  Talents/Skills  ADL's:  Intact  Cognition: WNL  Sleep:  Fair   Screenings: PHQ2-9    Flowsheet Row Video Visit from 07/23/2022 in [facility] [place] Video Visit from 05/01/2022 in [facility] at [place] Video Visit from 02/01/2022 in [facility] at [place] Video Visit from 11/07/2021 in [facility] at [place] Video Visit from 08/08/2021 in [facility] Total Score 1 1 1 1  0   Flowsheet Row Video Visit from 07/23/2022 in [facility] at [place] Video Visit from 05/01/2022 in [facility] at [place] Video Visit from 02/01/2022 in [facility] at [place]  C-SSRS RISK CATEGORY No Risk No Risk No Risk     Assessment and Plan: This patient is a 44 year old male with a history of bipolar disorder primarily depressed, obsessional symptoms generalized anxiety and episodic alcohol abuse.  He is doing well on his current regimen.  He will continue Pristiq  50 mg daily as well as Wellbutrin  SR 450 mg daily for major depression associated by bipolar disorder.  He will continue Xanax  2 mg 4 times daily as well as BuSpar  30 mg 3 times daily for anxiety, Tegretol  200 mg - 8 tablets at bedtime for mood stabilization and lurasidone  120 mg daily for mood stabilization.  He will return to see me in 3 months  Collaboration of Care: Collaboration of Care: Primary Care Provider AEB notes will be shared with PCP at patient's request  Patient/Guardian was advised Release of Information must be obtained prior to any record release in order to collaborate their care with an outside provider. Patient/Guardian was advised if they have not already done so to contact the registration department to sign all necessary forms in order for us  to release information regarding their care.   Consent: Patient/Guardian gives verbal consent for treatment and assignment of benefits for services provided during this visit. Patient/Guardian expressed understanding and agreed to proceed.    Barnie Gull, MD 01/03/2025, 8:47 AM     [1]  Allergies Allergen Reactions   Penicillins Rash    Has patient had a PCN reaction causing immediate rash, facial/tongue/throat  swelling, SOB or lightheadedness with hypotension: Yes Has patient had a PCN reaction causing severe rash involving mucus membranes or skin necrosis: No Has patient had a PCN reaction that required hospitalization No Has patient had a PCN reaction occurring within the last 10 years: No If all of the above answers are NO, then may proceed with Cephalosporin use.

## 2025-04-04 ENCOUNTER — Telehealth (HOSPITAL_COMMUNITY): Admitting: Psychiatry
